# Patient Record
Sex: Male | Born: 1935 | Race: White | Hispanic: No | Marital: Married | Smoking: Never smoker
Health system: Southern US, Community
[De-identification: ages and names within clinical notes are randomized; demographics above are authoritative.]

## PROBLEM LIST (undated history)

## (undated) DIAGNOSIS — L409 Psoriasis, unspecified: Secondary | ICD-10-CM

## (undated) DIAGNOSIS — C801 Malignant (primary) neoplasm, unspecified: Secondary | ICD-10-CM

## (undated) DIAGNOSIS — I10 Essential (primary) hypertension: Secondary | ICD-10-CM

## (undated) DIAGNOSIS — M109 Gout, unspecified: Secondary | ICD-10-CM

## (undated) DIAGNOSIS — G309 Alzheimer's disease, unspecified: Secondary | ICD-10-CM

## (undated) DIAGNOSIS — E7800 Pure hypercholesterolemia, unspecified: Secondary | ICD-10-CM

## (undated) DIAGNOSIS — F0280 Dementia in other diseases classified elsewhere without behavioral disturbance: Secondary | ICD-10-CM

## (undated) DIAGNOSIS — R609 Edema, unspecified: Secondary | ICD-10-CM

## (undated) DIAGNOSIS — K219 Gastro-esophageal reflux disease without esophagitis: Secondary | ICD-10-CM

## (undated) DIAGNOSIS — M1990 Unspecified osteoarthritis, unspecified site: Secondary | ICD-10-CM

## (undated) HISTORY — PX: ESOPHAGEAL DILATION: SHX303

## (undated) HISTORY — DX: Psoriasis, unspecified: L40.9

## (undated) HISTORY — DX: Edema, unspecified: R60.9

## (undated) HISTORY — PX: TONSILLECTOMY: SUR1361

---

## 2011-04-29 ENCOUNTER — Ambulatory Visit (INDEPENDENT_AMBULATORY_CARE_PROVIDER_SITE_OTHER): Payer: Medicare Other | Admitting: Urology

## 2011-04-29 ENCOUNTER — Other Ambulatory Visit: Payer: Self-pay | Admitting: Urology

## 2011-04-29 DIAGNOSIS — R972 Elevated prostate specific antigen [PSA]: Secondary | ICD-10-CM

## 2011-04-29 DIAGNOSIS — N400 Enlarged prostate without lower urinary tract symptoms: Secondary | ICD-10-CM

## 2011-05-13 ENCOUNTER — Other Ambulatory Visit: Payer: Self-pay | Admitting: Urology

## 2011-05-13 ENCOUNTER — Ambulatory Visit (HOSPITAL_COMMUNITY)
Admission: RE | Admit: 2011-05-13 | Discharge: 2011-05-13 | Disposition: A | Discharge status: Home / Self Care | Payer: Medicare Other | Source: Ambulatory Visit | Attending: Urology | Admitting: Urology

## 2011-05-13 DIAGNOSIS — R972 Elevated prostate specific antigen [PSA]: Secondary | ICD-10-CM

## 2011-09-16 ENCOUNTER — Ambulatory Visit (INDEPENDENT_AMBULATORY_CARE_PROVIDER_SITE_OTHER): Payer: Medicare Other | Admitting: Urology

## 2011-09-16 DIAGNOSIS — N400 Enlarged prostate without lower urinary tract symptoms: Secondary | ICD-10-CM

## 2011-09-16 DIAGNOSIS — C61 Malignant neoplasm of prostate: Secondary | ICD-10-CM

## 2011-10-27 ENCOUNTER — Other Ambulatory Visit (HOSPITAL_COMMUNITY): Payer: Self-pay | Admitting: Internal Medicine

## 2011-10-27 DIAGNOSIS — R413 Other amnesia: Secondary | ICD-10-CM

## 2011-10-29 ENCOUNTER — Ambulatory Visit (HOSPITAL_COMMUNITY): Payer: Medicare Other

## 2011-11-04 ENCOUNTER — Ambulatory Visit (HOSPITAL_COMMUNITY)
Admission: RE | Admit: 2011-11-04 | Discharge: 2011-11-04 | Disposition: A | Discharge status: Home / Self Care | Payer: Medicare Other | Source: Ambulatory Visit | Attending: Internal Medicine | Admitting: Internal Medicine

## 2011-11-04 DIAGNOSIS — G319 Degenerative disease of nervous system, unspecified: Secondary | ICD-10-CM | POA: Insufficient documentation

## 2011-11-04 DIAGNOSIS — R413 Other amnesia: Secondary | ICD-10-CM | POA: Insufficient documentation

## 2012-01-20 ENCOUNTER — Ambulatory Visit (INDEPENDENT_AMBULATORY_CARE_PROVIDER_SITE_OTHER): Payer: Medicare Other | Admitting: Urology

## 2012-01-20 DIAGNOSIS — N400 Enlarged prostate without lower urinary tract symptoms: Secondary | ICD-10-CM

## 2012-01-20 DIAGNOSIS — C61 Malignant neoplasm of prostate: Secondary | ICD-10-CM

## 2012-05-18 ENCOUNTER — Ambulatory Visit (INDEPENDENT_AMBULATORY_CARE_PROVIDER_SITE_OTHER): Payer: Medicare Other | Admitting: Urology

## 2012-05-18 DIAGNOSIS — N400 Enlarged prostate without lower urinary tract symptoms: Secondary | ICD-10-CM

## 2012-05-18 DIAGNOSIS — C61 Malignant neoplasm of prostate: Secondary | ICD-10-CM

## 2012-06-12 ENCOUNTER — Encounter (HOSPITAL_COMMUNITY): Payer: Self-pay

## 2012-06-12 ENCOUNTER — Other Ambulatory Visit: Payer: Self-pay

## 2012-06-12 ENCOUNTER — Emergency Department (HOSPITAL_COMMUNITY)
Admission: EM | Admit: 2012-06-12 | Discharge: 2012-06-12 | Disposition: A | Discharge status: Home / Self Care | Payer: Medicare Other | Attending: Internal Medicine | Admitting: Internal Medicine

## 2012-06-12 DIAGNOSIS — R000 Tachycardia, unspecified: Secondary | ICD-10-CM | POA: Insufficient documentation

## 2012-06-12 HISTORY — DX: Essential (primary) hypertension: I10

## 2012-06-12 HISTORY — DX: Gout, unspecified: M10.9

## 2012-06-12 HISTORY — DX: Pure hypercholesterolemia, unspecified: E78.00

## 2012-06-12 HISTORY — DX: Gastro-esophageal reflux disease without esophagitis: K21.9

## 2012-06-12 HISTORY — DX: Unspecified osteoarthritis, unspecified site: M19.90

## 2012-06-12 NOTE — ED Notes (Signed)
Apporx. 30 min pta, began having heart racing and felt like it was skipping a beat.

## 2012-06-13 NOTE — Progress Notes (Signed)
William Ramos, William Ramos               ACCOUNT NO.:  [id number]  MEDICAL RECORD NO.:  [id number]  LOCATION:  APA02                         FACILITY:  [facility]  PHYSICIAN:  Kingsley Callander. 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  He had not experienced chest pain, difficulty breathing, or syncope.  He does not use excessive amounts of caffeine. He is not using decongestants.  He does not use recreational drugs.  He has not abused alcohol.  He has no prior history of heart disease, but has had a history of hypertension.  He also has had a past history of prostate cancer and is undergoing observation in this regard.  He has been evaluated and treated also for memory loss.  PHYSICAL EXAMINATION:  GENERAL:  Reveals that he is alert, oriented, and in no distress. HEENT:  Unremarkable. LUNGS:  Clear. HEART:  Regular with no murmurs.  No ectopy is noted. ABDOMEN:  Soft and nontender with no hepatosplenomegaly. EXTREMITIES:  Revealed no cyanosis, clubbing, or edema. NEUROLOGIC:  Status is at baseline.  His electrocardiogram reveals normal sinus rhythm.  IMPRESSION:  Palpitations.  He likely felt premature atrial contractions or premature ventricular contractions earlier today.  His symptoms have resolved.  On monitoring in the emergency room, he is showing no ectopy. He has been reassured.  He will be observed and be seen in followup in the office as scheduled.     Kingsley Callander. Ouida Sills, MD     ROF/MEDQ  D:  06/13/2012  T:  06/13/2012  Job:  [id number]

## 2012-06-29 ENCOUNTER — Encounter (HOSPITAL_COMMUNITY): Payer: Self-pay

## 2012-06-29 ENCOUNTER — Encounter (HOSPITAL_COMMUNITY)
Admission: RE | Admit: 2012-06-29 | Discharge: 2012-06-29 | Disposition: A | Discharge status: Home / Self Care | Payer: Medicare Other | Source: Ambulatory Visit | Attending: Ophthalmology | Admitting: Ophthalmology

## 2012-06-29 HISTORY — DX: Malignant (primary) neoplasm, unspecified: C80.1

## 2012-06-29 LAB — BASIC METABOLIC PANEL
BUN: 20 mg/dL (ref 6–23)
CO2: 27 mEq/L (ref 19–32)
GFR calc non Af Amer: 67 mL/min — ABNORMAL LOW (ref >90)
Glucose, Bld: 96 mg/dL (ref 70–99)
Potassium: 3.9 mEq/L (ref 3.5–5.1)

## 2012-06-29 NOTE — Patient Instructions (Addendum)
20 William Ramos  06/29/2012   Your procedure is scheduled on:  07/01/12  Report to [facility] at 10:00 AM.  Call this number if you have problems the morning of surgery: [phone]   Remember:   Do not eat food or drink:After Midnight.  Take these medicines the morning of surgery with A SIP OF WATER: Amlodipine, Omeprazole, and Ramipril   Do not wear jewelry, make-up or nail polish.  Do not wear lotions, powders, or perfumes. You may wear deodorant.  Do not bring valuables to the hospital.  Contacts, dentures or bridgework may not be worn into surgery.  Leave suitcase in the car. After surgery it may be brought to your room.  For patients admitted to the hospital, checkout time is 11:00 AM the day of discharge.   Patients discharged the day of surgery will not be allowed to drive home.  Special Instructions:Use your eye drops prior to surgery as directed by your eye doctor.   Please read over the following fact sheets that you were given: Pain Booklet, Anesthesia Post-op Instructions and Care and Recovery After Surgery    Cataract A cataract is a clouding of the lens of the eye. When a lens becomes cloudy, vision is reduced based on the degree and nature of the clouding. Many cataracts reduce vision to some degree. Some cataracts make people more near-sighted as they develop. Other cataracts increase glare. Cataracts that are ignored and become worse can sometimes look William Ramos. The William Ramos color can be seen through the pupil. CAUSES   Aging. However, cataracts may occur at any age, even in newborns.   Certain drugs.   Trauma to the eye.   Certain diseases such as diabetes.   Specific eye diseases such as chronic inflammation inside the eye or a sudden attack of a rare form of glaucoma.   Inherited or acquired medical problems.  SYMPTOMS   Gradual, progressive drop in vision in the affected eye.   Severe, rapid visual loss. This most often happens when trauma is the cause.    DIAGNOSIS  To detect a cataract, an eye doctor examines the lens. Cataracts are best diagnosed with an exam of the eyes with the pupils enlarged (dilated) by drops.  TREATMENT  For an early cataract, vision may improve by using different eyeglasses or stronger lighting. If that does not help your vision, surgery is the only effective treatment. A cataract needs to be surgically removed when vision loss interferes with your everyday activities, such as driving, reading, or watching TV. A cataract may also have to be removed if it prevents examination or treatment of another eye problem. Surgery removes the cloudy lens and usually replaces it with a substitute lens (intraocular lens, IOL).  At a time when both you and your doctor agree, the cataract will be surgically removed. If you have cataracts in both eyes, only one is usually removed at a time. This allows the operated eye to heal and be out of danger from any possible problems after surgery (such as infection or poor wound healing). In rare cases, a cataract may be doing damage to your eye. In these cases, your caregiver may advise surgical removal right away. The vast majority of people who have cataract surgery have better vision afterward. HOME CARE INSTRUCTIONS  If you are not planning surgery, you may be asked to do the following:  Use different eyeglasses.   Use stronger or brighter lighting.   Ask your eye doctor about reducing your medicine  dose or changing medicines if it is thought that a medicine caused your cataract. Changing medicines does not make the cataract go away on its own.   Become familiar with your surroundings. Poor vision can lead to injury. Avoid bumping into things on the affected side. You are at a higher risk for tripping or falling.   Exercise extreme care when driving or operating machinery.   Wear sunglasses if you are sensitive to bright light or experiencing problems with glare.  SEEK IMMEDIATE MEDICAL CARE  IF:   You have a worsening or sudden vision loss.   You notice redness, swelling, or increasing pain in the eye.   You have a fever.  Document Released: 11/17/2005 Document Revised: 11/06/2011 Document Reviewed: 07/11/2011 [facility] Patient Information 2012 [place], [place].   PATIENT INSTRUCTIONS POST-ANESTHESIA  IMMEDIATELY FOLLOWING SURGERY:  Do not drive or operate machinery for the first twenty four hours after surgery.  Do not make any important decisions for twenty four hours after surgery or while taking narcotic pain medications or sedatives.  If you develop intractable nausea and vomiting or a severe headache please notify your doctor immediately.  FOLLOW-UP:  Please make an appointment with your surgeon as instructed. You do not need to follow up with anesthesia unless specifically instructed to do so.  WOUND CARE INSTRUCTIONS (if applicable):  Keep a dry clean dressing on the anesthesia/puncture wound site if there is drainage.  Once the wound has quit draining you may leave it open to air.  Generally you should leave the bandage intact for twenty four hours unless there is drainage.  If the epidural site drains for more than 36-48 hours please call the anesthesia department.  QUESTIONS?:  Please feel free to call your physician or the hospital operator if you have any questions, and they will be happy to assist you.

## 2012-06-30 MED ORDER — LIDOCAINE HCL 3.5 % OP GEL
OPHTHALMIC | Status: AC
  Filled 2012-06-30: qty 5

## 2012-06-30 MED ORDER — TETRACAINE HCL 0.5 % OP SOLN
OPHTHALMIC | Status: AC
  Filled 2012-06-30: qty 2

## 2012-06-30 MED ORDER — CYCLOPENTOLATE-PHENYLEPHRINE 0.2-1 % OP SOLN: 1.0000 [drp] | OPHTHALMIC | Status: DC

## 2012-06-30 MED ORDER — LIDOCAINE HCL (PF) 1 % IJ SOLN
INTRAMUSCULAR | Status: AC
  Filled 2012-06-30: qty 2

## 2012-06-30 MED ORDER — NEOMYCIN-POLYMYXIN-DEXAMETH 3.5-10000-0.1 OP OINT
TOPICAL_OINTMENT | OPHTHALMIC | Status: AC
  Filled 2012-06-30: qty 3.5

## 2012-06-30 MED ORDER — PHENYLEPHRINE HCL 2.5 % OP SOLN
OPHTHALMIC | Status: AC
  Filled 2012-06-30: qty 2

## 2012-07-01 ENCOUNTER — Ambulatory Visit (HOSPITAL_COMMUNITY): Payer: Medicare Other | Admitting: Anesthesiology

## 2012-07-01 ENCOUNTER — Encounter (HOSPITAL_COMMUNITY)
Admission: RE | Disposition: A | Discharge status: Home / Self Care | Payer: Self-pay | Source: Ambulatory Visit | Attending: Ophthalmology

## 2012-07-01 ENCOUNTER — Encounter (HOSPITAL_COMMUNITY): Payer: Self-pay | Admitting: Anesthesiology

## 2012-07-01 ENCOUNTER — Encounter (HOSPITAL_COMMUNITY): Payer: Self-pay | Admitting: *Deleted

## 2012-07-01 ENCOUNTER — Ambulatory Visit (HOSPITAL_COMMUNITY)
Admission: RE | Admit: 2012-07-01 | Discharge: 2012-07-01 | Disposition: A | Discharge status: Home / Self Care | Payer: Medicare Other | Source: Ambulatory Visit | Attending: Ophthalmology | Admitting: Ophthalmology

## 2012-07-01 DIAGNOSIS — I10 Essential (primary) hypertension: Secondary | ICD-10-CM | POA: Insufficient documentation

## 2012-07-01 DIAGNOSIS — Z01812 Encounter for preprocedural laboratory examination: Secondary | ICD-10-CM | POA: Insufficient documentation

## 2012-07-01 DIAGNOSIS — Z79899 Other long term (current) drug therapy: Secondary | ICD-10-CM | POA: Insufficient documentation

## 2012-07-01 DIAGNOSIS — H2589 Other age-related cataract: Secondary | ICD-10-CM | POA: Insufficient documentation

## 2012-07-01 HISTORY — PX: CATARACT EXTRACTION W/PHACO: SHX586

## 2012-07-01 SURGERY — PHACOEMULSIFICATION, CATARACT, WITH IOL INSERTION
Anesthesia: Monitor Anesthesia Care | Site: Eye | Laterality: Right | Wound class: Clean

## 2012-07-01 MED ORDER — LIDOCAINE 3.5 % OP GEL OPTIME - NO CHARGE
OPHTHALMIC | Status: DC | PRN
  Administered 2012-07-01: 2 [drp] via OPHTHALMIC

## 2012-07-01 MED ORDER — CYCLOPENTOLATE-PHENYLEPHRINE 0.2-1 % OP SOLN
1.0000 [drp] | OPHTHALMIC | Status: AC
  Administered 2012-07-01 (×3): 1 [drp] via OPHTHALMIC

## 2012-07-01 MED ORDER — TETRACAINE HCL 0.5 % OP SOLN
1.0000 [drp] | OPHTHALMIC | Status: AC
  Administered 2012-07-01 (×3): 1 [drp] via OPHTHALMIC

## 2012-07-01 MED ORDER — PHENYLEPHRINE HCL 2.5 % OP SOLN
1.0000 [drp] | OPHTHALMIC | Status: AC
  Administered 2012-07-01 (×3): 1 [drp] via OPHTHALMIC

## 2012-07-01 MED ORDER — EPINEPHRINE HCL 1 MG/ML IJ SOLN
INTRAOCULAR | Status: DC | PRN
  Administered 2012-07-01: 12:00:00

## 2012-07-01 MED ORDER — LIDOCAINE HCL (PF) 1 % IJ SOLN
INTRAMUSCULAR | Status: DC | PRN
  Administered 2012-07-01: .4 mL

## 2012-07-01 MED ORDER — PROVISC 10 MG/ML IO SOLN
INTRAOCULAR | Status: DC | PRN
  Administered 2012-07-01: 8.5 mg via INTRAOCULAR

## 2012-07-01 MED ORDER — LIDOCAINE HCL 3.5 % OP GEL: 1.0000 "application " | Freq: Once | OPHTHALMIC | Status: DC

## 2012-07-01 MED ORDER — BSS IO SOLN
INTRAOCULAR | Status: DC | PRN
  Administered 2012-07-01: 15 mL via INTRAOCULAR

## 2012-07-01 MED ORDER — POVIDONE-IODINE 5 % OP SOLN
OPHTHALMIC | Status: DC | PRN
  Administered 2012-07-01: 1 via OPHTHALMIC

## 2012-07-01 MED ORDER — LACTATED RINGERS IV SOLN
INTRAVENOUS | Status: DC | PRN
  Administered 2012-07-01: 12:00:00 via INTRAVENOUS

## 2012-07-01 MED ORDER — NEOMYCIN-POLYMYXIN-DEXAMETH 0.1 % OP OINT
TOPICAL_OINTMENT | OPHTHALMIC | Status: DC | PRN
  Administered 2012-07-01: 1 via OPHTHALMIC

## 2012-07-01 MED ORDER — EPINEPHRINE HCL 1 MG/ML IJ SOLN
INTRAMUSCULAR | Status: AC
  Filled 2012-07-01: qty 1

## 2012-07-01 MED ORDER — MIDAZOLAM HCL 2 MG/2ML IJ SOLN
INTRAMUSCULAR | Status: AC
  Filled 2012-07-01: qty 2

## 2012-07-01 SURGICAL SUPPLY — 32 items
CAPSULAR TENSION RING-AMO (OPHTHALMIC RELATED) IMPLANT
CLOTH BEACON ORANGE TIMEOUT ST (SAFETY) ×2 IMPLANT
EYE SHIELD UNIVERSAL CLEAR (GAUZE/BANDAGES/DRESSINGS) ×2 IMPLANT
GLOVE BIO SURGEON STRL SZ 6.5 (GLOVE) IMPLANT
GLOVE BIOGEL PI IND STRL 6.5 (GLOVE) IMPLANT
GLOVE BIOGEL PI IND STRL 7.0 (GLOVE) ×1 IMPLANT
GLOVE BIOGEL PI IND STRL 7.5 (GLOVE) IMPLANT
GLOVE BIOGEL PI INDICATOR 6.5 (GLOVE)
GLOVE BIOGEL PI INDICATOR 7.0 (GLOVE) ×1
GLOVE BIOGEL PI INDICATOR 7.5 (GLOVE)
GLOVE ECLIPSE 6.5 STRL STRAW (GLOVE) IMPLANT
GLOVE ECLIPSE 7.0 STRL STRAW (GLOVE) IMPLANT
GLOVE ECLIPSE 7.5 STRL STRAW (GLOVE) IMPLANT
GLOVE EXAM NITRILE LRG STRL (GLOVE) IMPLANT
GLOVE EXAM NITRILE MD LF STRL (GLOVE) ×2 IMPLANT
GLOVE SKINSENSE NS SZ6.5 (GLOVE)
GLOVE SKINSENSE NS SZ7.0 (GLOVE)
GLOVE SKINSENSE STRL SZ6.5 (GLOVE) IMPLANT
GLOVE SKINSENSE STRL SZ7.0 (GLOVE) IMPLANT
KIT VITRECTOMY (OPHTHALMIC RELATED) IMPLANT
PAD ARMBOARD 7.5X6 YLW CONV (MISCELLANEOUS) ×2 IMPLANT
PROC W NO LENS (INTRAOCULAR LENS)
PROC W SPEC LENS (INTRAOCULAR LENS)
PROCESS W NO LENS (INTRAOCULAR LENS) IMPLANT
PROCESS W SPEC LENS (INTRAOCULAR LENS) IMPLANT
RING MALYGIN (MISCELLANEOUS) IMPLANT
SIGHTPATH CAT PROC W REG LENS (Ophthalmic Related) ×2 IMPLANT
SYR TB 1ML LL NO SAFETY (SYRINGE) ×2 IMPLANT
TAPE SURG TRANSPORE 1 IN (GAUZE/BANDAGES/DRESSINGS) ×1 IMPLANT
TAPE SURGICAL TRANSPORE 1 IN (GAUZE/BANDAGES/DRESSINGS) ×1
VISCOELASTIC ADDITIONAL (OPHTHALMIC RELATED) IMPLANT
WATER STERILE IRR 250ML POUR (IV SOLUTION) ×2 IMPLANT

## 2012-07-01 NOTE — Transfer of Care (Signed)
Immediate Anesthesia Transfer of Care Note  Patient: William Ramos  Procedure(s) Performed: Procedure(s) (LRB): CATARACT EXTRACTION PHACO AND INTRAOCULAR LENS PLACEMENT (IOC) (Right)  Patient Location: PACU and Short Stay  Anesthesia Type: MAC  Level of Consciousness: awake, alert , oriented and patient cooperative  Airway & Oxygen Therapy: Patient Spontanous Breathing  Post-op Assessment: Report given to PACU RN, Post -op Vital signs reviewed and stable and Patient moving all extremities  Post vital signs: Reviewed and stable  Complications: No apparent anesthesia complications

## 2012-07-01 NOTE — Anesthesia Postprocedure Evaluation (Signed)
  Anesthesia Post-op Note  Patient: William Ramos  Procedure(s) Performed: Procedure(s) (LRB): CATARACT EXTRACTION PHACO AND INTRAOCULAR LENS PLACEMENT (IOC) (Right)  Patient Location: PACU and Short Stay  Anesthesia Type: MAC  Level of Consciousness: awake, alert , oriented and patient cooperative  Airway and Oxygen Therapy: Patient Spontanous Breathing  Post-op Pain: none  Post-op Assessment: Post-op Vital signs reviewed, Patient's Cardiovascular Status Stable, Respiratory Function Stable, Patent Airway, No signs of Nausea or vomiting and Pain level controlled  Post-op Vital Signs: Reviewed and stable  Complications: No apparent anesthesia complications

## 2012-07-01 NOTE — Anesthesia Preprocedure Evaluation (Signed)
Anesthesia Evaluation  Patient identified by MRN, date of birth, ID band Patient awake    Reviewed: Allergy & Precautions, H&P , NPO status , Patient's Chart, lab work & pertinent test results  Airway Mallampati: I TM Distance: >3 FB     Dental  (+) Teeth Intact and Implants   Pulmonary neg pulmonary ROS,  breath sounds clear to auscultation        Cardiovascular hypertension, Pt. on medications Rhythm:Regular Rate:Normal     Neuro/Psych    GI/Hepatic GERD-  Medicated and Controlled,  Endo/Other    Renal/GU      Musculoskeletal   Abdominal   Peds  Hematology   Anesthesia Other Findings   Reproductive/Obstetrics                           Anesthesia Physical Anesthesia Plan  ASA: II  Anesthesia Plan: MAC   Post-op Pain Management:    Induction: Intravenous  Airway Management Planned: Nasal Cannula  Additional Equipment:   Intra-op Plan:   Post-operative Plan:   Informed Consent: I have reviewed the patients History and Physical, chart, labs and discussed the procedure including the risks, benefits and alternatives for the proposed anesthesia with the patient or authorized representative who has indicated his/her understanding and acceptance.     Plan Discussed with:   Anesthesia Plan Comments:         Anesthesia Quick Evaluation  

## 2012-07-01 NOTE — Brief Op Note (Signed)
Pre-Op Dx: Cataract OD Post-Op Dx: Cataract OD Surgeon: Avalie Oconnor Anesthesia: Topical with MAC Surgery: Cataract Extraction with Intraocular lens Implant OD Implant: Lenstec, Model Softec HD Blood Loss: None Specimen: None Complications: None 

## 2012-07-01 NOTE — H&P (Signed)
I have reviewed the H&P, the patient was re-examined, and I have identified no interval changes in medical condition and plan of care since the history and physical of record  

## 2012-07-02 NOTE — Op Note (Signed)
William Ramos, William Ramos               ACCOUNT NO.:  [id number]  MEDICAL RECORD NO.:  [id number]  LOCATION:  APPO                          FACILITY:  [facility]  PHYSICIAN:  Susanne Greenhouse, MD       DATE OF BIRTH:  1928/05/06  DATE OF PROCEDURE: DATE OF DISCHARGE:  07/01/2012                              OPERATIVE REPORT   PREOPERATIVE DIAGNOSIS:  Combined cataract, right eye.  POSTOPERATIVE DIAGNOSIS:  Combined cataract, right eye.  DIAGNOSIS CODE:  366.19.  OPERATION PERFORMED:  Phacoemulsification with posterior chamber intraocular lens implantation, right eye.  SURGEON:  Bonne Dolores. Karlen Barbar, MD.  ANESTHESIA:  Topical with IV sedation.  OPERATIVE SUMMARY:  In the preoperative area, dilating drops were placed into the right eye.  The patient was then brought into the operating room where he was placed under general anesthesia.  The eye was then prepped and draped.  Beginning with a 75 blade, a paracentesis port was made at the surgeon's 2 o'clock position.  The anterior chamber was then filled with a 1% nonpreserved lidocaine solution with epinephrine.  This was followed by Viscoat to deepen the chamber.  A small fornix-based peritomy was performed superiorly.  Next, a single iris hook was placed through the limbus superiorly.  A 2.4-mm keratome blade was then used to make a clear corneal incision over the iris hook.  A bent cystotome needle and Utrata forceps were used to create a continuous tear capsulotomy.  Hydrodissection was performed using balanced salt solution on a fine cannula.  The lens nucleus was then removed using phacoemulsification in a quadrant cracking technique.  The cortical material was then removed with irrigation and aspiration.  The capsular bag and anterior chamber were refilled with Provisc.  The wound was widened to approximately 3 mm and a posterior chamber intraocular lens was placed into the capsular bag without difficulty using an [employer] lens injecting  system.  A single 10-0 nylon suture was then used to close the incision as well as stromal hydration.  The Provisc was removed from the anterior chamber and capsular bag with irrigation and aspiration.  At this point, the wounds were tested for leak, which were negative.  The anterior chamber remained deep and stable.  The patient tolerated the procedure well.  There were no operative complications, and he awoke from general anesthesia without problem.  No surgical specimens.  The prosthetic device used, Lenstec posterior chamber lens model Softec HD, power of 22.5, serial number is [id number].          ______________________________ Susanne Greenhouse, MD     KEH/MEDQ  D:  07/01/2012  T:  07/02/2012  Job:  [id number]

## 2012-07-05 ENCOUNTER — Encounter (HOSPITAL_COMMUNITY): Payer: Self-pay | Admitting: Ophthalmology

## 2012-07-26 ENCOUNTER — Encounter (HOSPITAL_COMMUNITY): Payer: Self-pay | Admitting: Pharmacy Technician

## 2012-07-28 ENCOUNTER — Inpatient Hospital Stay (HOSPITAL_COMMUNITY): Admission: RE | Admit: 2012-07-28 | Payer: Medicare Other | Source: Ambulatory Visit

## 2012-08-26 ENCOUNTER — Encounter (HOSPITAL_COMMUNITY): Payer: Self-pay

## 2012-08-26 ENCOUNTER — Encounter (HOSPITAL_COMMUNITY)
Admission: RE | Admit: 2012-08-26 | Discharge: 2012-08-26 | Payer: Medicare Other | Source: Ambulatory Visit | Attending: Ophthalmology | Admitting: Ophthalmology

## 2012-08-26 LAB — HEMOGLOBIN AND HEMATOCRIT, BLOOD
HCT: 38.3 % — ABNORMAL LOW (ref 39.0–52.0)
Hemoglobin: 12.9 g/dL — ABNORMAL LOW (ref 13.0–17.0)

## 2012-08-26 LAB — BASIC METABOLIC PANEL
CO2: 26 mEq/L (ref 19–32)
Chloride: 104 mEq/L (ref 96–112)
GFR calc Af Amer: 66 mL/min — ABNORMAL LOW (ref >90)
Potassium: 4.2 mEq/L (ref 3.5–5.1)
Sodium: 139 mEq/L (ref 135–145)

## 2012-08-26 MED ORDER — MIDAZOLAM HCL 2 MG/2ML IJ SOLN: 1.0000 mg | INTRAMUSCULAR | Status: DC | PRN

## 2012-08-26 MED ORDER — FENTANYL CITRATE 0.05 MG/ML IJ SOLN: 25.0000 ug | INTRAMUSCULAR | Status: DC | PRN

## 2012-08-26 MED ORDER — LACTATED RINGERS IV SOLN: INTRAVENOUS | Status: DC

## 2012-08-26 MED ORDER — ONDANSETRON HCL 4 MG/2ML IJ SOLN: 4.0000 mg | Freq: Once | INTRAMUSCULAR | Status: DC | PRN

## 2012-08-26 NOTE — Patient Instructions (Addendum)
Your procedure is scheduled on:  Thursday, 09/02/12  Report to [facility] at    0915  AM.  Call this number if you have problems the morning of surgery: [phone]   Remember:   Do not eat or drink   After Midnight.  Take these medicines the morning of surgery with A SIP OF WATER: norvasc, aricept, prilosec, altace   Do not wear jewelry, make-up or nail polish.  Do not wear lotions, powders, or perfumes. You may wear deodorant.  Do not bring valuables to the hospital.  Contacts, dentures or bridgework may not be worn into surgery.     Patients discharged the day of surgery will not be allowed to drive home.  Name and phone number of your driver: driver  Special Instructions: Use eye drops as directed.   Please read over the following fact sheets that you were given: Pain Booklet, Anesthesia Post-op Instructions and Care and Recovery After Surgery    Cataract Surgery  A cataract is a clouding of the lens of the eye. When a lens becomes cloudy, vision is reduced based on the degree and nature of the clouding. Surgery may be needed to improve vision. Surgery removes the cloudy lens and usually replaces it with a substitute lens (intraocular lens, IOL). LET YOUR EYE DOCTOR KNOW ABOUT:  Allergies to food or medicine.   Medicines taken including herbs, eyedrops, over-the-counter medicines, and creams.   Use of steroids (by mouth or creams).   Previous problems with anesthetics or numbing medicine.   History of bleeding problems or blood clots.   Previous surgery.   Other health problems, including diabetes and kidney problems.   Possibility of pregnancy, if this applies.  RISKS AND COMPLICATIONS  Infection.   Inflammation of the eyeball (endophthalmitis) that can spread to both eyes (sympathetic ophthalmia).   Poor wound healing.   If an IOL is inserted, it can later fall out of proper position. This is very uncommon.   Clouding of the part of your eye that holds an IOL in  place. This is called an "after-cataract." These are uncommon, but easily treated.  BEFORE THE PROCEDURE  Do not eat or drink anything except small amounts of water for 8 to 12 before your surgery, or as directed by your caregiver.   Unless you are told otherwise, continue any eyedrops you have been prescribed.   Talk to your primary caregiver about all other medicines that you take (both prescription and non-prescription). In some cases, you may need to stop or change medicines near the time of your surgery. This is most important if you are taking blood-thinning medicine.Do not stop medicines unless you are told to do so.   Arrange for someone to drive you to and from the procedure.   Do not put contact lenses in either eye on the day of your surgery.  PROCEDURE There is more than one method for safely removing a cataract. Your doctor can explain the differences and help determine which is best for you. Phacoemulsification surgery is the most common form of cataract surgery.  An injection is given behind the eye or eyedrops are given to make this a painless procedure.   A small cut (incision) is made on the edge of the clear, dome-shaped surface that covers the front of the eye (cornea).   A tiny probe is painlessly inserted into the eye. This device gives off ultrasound waves that soften and break up the cloudy center of the lens. This makes  it easier for the cloudy lens to be removed by suction.   An IOL may be implanted.   The normal lens of the eye is covered by a clear capsule. Part of that capsule is intentionally left in the eye to support the IOL.   Your surgeon may or may not use stitches to close the incision.  There are other forms of cataract surgery that require a larger incision and stiches to close the eye. This approach is taken in cases where the doctor feels that the cataract cannot be easily removed using phacoemulsification. AFTER THE PROCEDURE  When an IOL is  implanted, it does not need care. It becomes a permanent part of your eye and cannot be seen or felt.   Your doctor will schedule follow-up exams to check on your progress.   Review your other medicines with your doctor to see which can be resumed after surgery.   Use eyedrops or take medicine as prescribed by your doctor.  Document Released: 11/06/2011 Document Reviewed: 11/03/2011 [facility] Patient Information 2012 [place], [place].  PATIENT INSTRUCTIONS POST-ANESTHESIA  IMMEDIATELY FOLLOWING SURGERY:  Do not drive or operate machinery for the first twenty four hours after surgery.  Do not make any important decisions for twenty four hours after surgery or while taking narcotic pain medications or sedatives.  If you develop intractable nausea and vomiting or a severe headache please notify your doctor immediately.  FOLLOW-UP:  Please make an appointment with your surgeon as instructed. You do not need to follow up with anesthesia unless specifically instructed to do so.  WOUND CARE INSTRUCTIONS (if applicable):  Keep a dry clean dressing on the anesthesia/puncture wound site if there is drainage.  Once the wound has quit draining you may leave it open to air.  Generally you should leave the bandage intact for twenty four hours unless there is drainage.  If the epidural site drains for more than 36-48 hours please call the anesthesia department.  QUESTIONS?:  Please feel free to call your physician or the hospital operator if you have any questions, and they will be happy to assist you.

## 2012-08-31 ENCOUNTER — Other Ambulatory Visit: Payer: Self-pay | Admitting: Urology

## 2012-08-31 ENCOUNTER — Ambulatory Visit (INDEPENDENT_AMBULATORY_CARE_PROVIDER_SITE_OTHER): Payer: Medicare Other | Admitting: Urology

## 2012-08-31 DIAGNOSIS — C61 Malignant neoplasm of prostate: Secondary | ICD-10-CM

## 2012-09-01 MED ORDER — PHENYLEPHRINE HCL 2.5 % OP SOLN
OPHTHALMIC | Status: AC
  Filled 2012-09-01: qty 2

## 2012-09-01 MED ORDER — TETRACAINE HCL 0.5 % OP SOLN
OPHTHALMIC | Status: AC
  Filled 2012-09-01: qty 2

## 2012-09-01 MED ORDER — NEOMYCIN-POLYMYXIN-DEXAMETH 3.5-10000-0.1 OP OINT
TOPICAL_OINTMENT | OPHTHALMIC | Status: AC
  Filled 2012-09-01: qty 3.5

## 2012-09-01 MED ORDER — LIDOCAINE HCL (PF) 1 % IJ SOLN
INTRAMUSCULAR | Status: AC
  Filled 2012-09-01: qty 2

## 2012-09-01 MED ORDER — LIDOCAINE HCL 3.5 % OP GEL
OPHTHALMIC | Status: AC
  Filled 2012-09-01: qty 5

## 2012-09-01 MED ORDER — CYCLOPENTOLATE HCL 1 % OP SOLN
OPHTHALMIC | Status: AC
  Filled 2012-09-01: qty 2

## 2012-09-02 ENCOUNTER — Encounter (HOSPITAL_COMMUNITY): Payer: Self-pay | Admitting: Anesthesiology

## 2012-09-02 ENCOUNTER — Ambulatory Visit (HOSPITAL_COMMUNITY): Payer: Medicare Other | Admitting: Anesthesiology

## 2012-09-02 ENCOUNTER — Encounter (HOSPITAL_COMMUNITY)
Admission: RE | Disposition: A | Discharge status: Home / Self Care | Payer: Self-pay | Source: Ambulatory Visit | Attending: Ophthalmology

## 2012-09-02 ENCOUNTER — Encounter (HOSPITAL_COMMUNITY): Payer: Self-pay | Admitting: *Deleted

## 2012-09-02 ENCOUNTER — Ambulatory Visit (HOSPITAL_COMMUNITY)
Admission: RE | Admit: 2012-09-02 | Discharge: 2012-09-02 | Disposition: A | Discharge status: Home / Self Care | Payer: Medicare Other | Source: Ambulatory Visit | Attending: Ophthalmology | Admitting: Ophthalmology

## 2012-09-02 DIAGNOSIS — I10 Essential (primary) hypertension: Secondary | ICD-10-CM | POA: Insufficient documentation

## 2012-09-02 DIAGNOSIS — Z01812 Encounter for preprocedural laboratory examination: Secondary | ICD-10-CM | POA: Insufficient documentation

## 2012-09-02 DIAGNOSIS — H2510 Age-related nuclear cataract, unspecified eye: Secondary | ICD-10-CM | POA: Insufficient documentation

## 2012-09-02 HISTORY — PX: CATARACT EXTRACTION W/PHACO: SHX586

## 2012-09-02 SURGERY — PHACOEMULSIFICATION, CATARACT, WITH IOL INSERTION
Anesthesia: Monitor Anesthesia Care | Site: Eye | Laterality: Left | Wound class: Clean

## 2012-09-02 MED ORDER — LACTATED RINGERS IV SOLN
INTRAVENOUS | Status: DC | PRN
  Administered 2012-09-02: 11:00:00 via INTRAVENOUS

## 2012-09-02 MED ORDER — BSS IO SOLN
INTRAOCULAR | Status: DC | PRN
  Administered 2012-09-02: 15 mL via INTRAOCULAR

## 2012-09-02 MED ORDER — TETRACAINE HCL 0.5 % OP SOLN
1.0000 [drp] | OPHTHALMIC | Status: AC
  Administered 2012-09-02 (×3): 1 [drp] via OPHTHALMIC

## 2012-09-02 MED ORDER — EPINEPHRINE HCL 1 MG/ML IJ SOLN
INTRAOCULAR | Status: DC | PRN
  Administered 2012-09-02: 12:00:00

## 2012-09-02 MED ORDER — MIDAZOLAM HCL 2 MG/2ML IJ SOLN
INTRAMUSCULAR | Status: AC
  Filled 2012-09-02: qty 2

## 2012-09-02 MED ORDER — PROVISC 10 MG/ML IO SOLN
INTRAOCULAR | Status: DC | PRN
  Administered 2012-09-02: 8.5 mg via INTRAOCULAR

## 2012-09-02 MED ORDER — CYCLOPENTOLATE-PHENYLEPHRINE 0.2-1 % OP SOLN: 1.0000 [drp] | OPHTHALMIC | Status: AC

## 2012-09-02 MED ORDER — POVIDONE-IODINE 5 % OP SOLN
OPHTHALMIC | Status: DC | PRN
  Administered 2012-09-02: 1 via OPHTHALMIC

## 2012-09-02 MED ORDER — PHENYLEPHRINE HCL 2.5 % OP SOLN
1.0000 [drp] | OPHTHALMIC | Status: AC
  Administered 2012-09-02 (×3): 1 [drp] via OPHTHALMIC

## 2012-09-02 MED ORDER — LIDOCAINE HCL 3.5 % OP GEL: 1.0000 "application " | Freq: Once | OPHTHALMIC | Status: DC

## 2012-09-02 MED ORDER — LACTATED RINGERS IV SOLN
INTRAVENOUS | Status: DC
  Administered 2012-09-02: 1000 mL via INTRAVENOUS

## 2012-09-02 MED ORDER — MIDAZOLAM HCL 2 MG/2ML IJ SOLN
1.0000 mg | INTRAMUSCULAR | Status: DC | PRN
  Administered 2012-09-02: 2 mg via INTRAVENOUS

## 2012-09-02 MED ORDER — EPINEPHRINE HCL 1 MG/ML IJ SOLN
INTRAMUSCULAR | Status: AC
  Filled 2012-09-02: qty 1

## 2012-09-02 MED ORDER — LIDOCAINE HCL (PF) 1 % IJ SOLN
INTRAMUSCULAR | Status: DC | PRN
  Administered 2012-09-02: .4 mL

## 2012-09-02 MED ORDER — LIDOCAINE 3.5 % OP GEL OPTIME - NO CHARGE
OPHTHALMIC | Status: DC | PRN
  Administered 2012-09-02: 2 [drp] via OPHTHALMIC

## 2012-09-02 MED ORDER — CYCLOPENTOLATE HCL 1 % OP SOLN
1.0000 [drp] | OPHTHALMIC | Status: AC
  Administered 2012-09-02 (×3): 1 [drp] via OPHTHALMIC

## 2012-09-02 MED ORDER — NEOMYCIN-POLYMYXIN-DEXAMETH 0.1 % OP OINT
TOPICAL_OINTMENT | OPHTHALMIC | Status: DC | PRN
  Administered 2012-09-02: 1 via OPHTHALMIC

## 2012-09-02 SURGICAL SUPPLY — 32 items

## 2012-09-02 NOTE — Anesthesia Postprocedure Evaluation (Signed)
  Anesthesia Post-op Note  Patient: William Ramos  Procedure(s) Performed: Procedure(s) (LRB) with comments: CATARACT EXTRACTION PHACO AND INTRAOCULAR LENS PLACEMENT (IOC) (Left) - CDE 16.23  Patient Location: Short stay  Anesthesia Type: MAC  Level of Consciousness: awake, alert , oriented and patient cooperative  Airway and Oxygen Therapy: Patient Spontanous Breathing  Post-op Pain: none  Post-op Assessment: Post-op Vital signs reviewed, Patient's Cardiovascular Status Stable, Respiratory Function Stable, Patent Airway, No signs of Nausea or vomiting and Pain level controlled  Post-op Vital Signs: Reviewed and stable  Complications: No apparent anesthesia complications

## 2012-09-02 NOTE — Brief Op Note (Signed)
Pre-Op Dx: Cataract OS Post-Op Dx: Cataract OS Surgeon: Ruperto Kiernan Anesthesia: Topical with MAC Surgery: Cataract Extraction with Intraocular lens Implant OS Implant: Lenstec, Model Softec HD Blood Loss: None Specimen: None Complications: None 

## 2012-09-02 NOTE — Anesthesia Preprocedure Evaluation (Signed)
Anesthesia Evaluation  Patient identified by MRN, date of birth, ID band Patient awake    Reviewed: Allergy & Precautions, H&P , NPO status , Patient's Chart, lab work & pertinent test results  Airway Mallampati: I TM Distance: >3 FB     Dental  (+) Teeth Intact and Implants   Pulmonary neg pulmonary ROS,  breath sounds clear to auscultation        Cardiovascular hypertension, Pt. on medications Rhythm:Regular Rate:Normal     Neuro/Psych    GI/Hepatic GERD-  Medicated and Controlled,  Endo/Other    Renal/GU      Musculoskeletal   Abdominal   Peds  Hematology   Anesthesia Other Findings   Reproductive/Obstetrics                           Anesthesia Physical Anesthesia Plan  ASA: II  Anesthesia Plan: MAC   Post-op Pain Management:    Induction: Intravenous  Airway Management Planned: Nasal Cannula  Additional Equipment:   Intra-op Plan:   Post-operative Plan:   Informed Consent: I have reviewed the patients History and Physical, chart, labs and discussed the procedure including the risks, benefits and alternatives for the proposed anesthesia with the patient or authorized representative who has indicated his/her understanding and acceptance.     Plan Discussed with:   Anesthesia Plan Comments:         Anesthesia Quick Evaluation

## 2012-09-02 NOTE — Transfer of Care (Signed)
  Anesthesia Post-op Note  Patient: William Ramos  Procedure(s) Performed: Procedure(s) (LRB) with comments: CATARACT EXTRACTION PHACO AND INTRAOCULAR LENS PLACEMENT (IOC) (Left) - CDE 16.23  Patient Location: Short stay  Anesthesia Type: MAC  Level of Consciousness: awake, alert , oriented and patient cooperative  Airway and Oxygen Therapy: Patient Spontanous Breathing  Post-op Pain: none  Post-op Assessment: Post-op Vital signs reviewed, Patient's Cardiovascular Status Stable, Respiratory Function Stable, Patent Airway, No signs of Nausea or vomiting and Pain level controlled  Post-op Vital Signs: Reviewed and stable  Complications: No apparent anesthesia complications  

## 2012-09-02 NOTE — H&P (Signed)
I have reviewed the H&P, the patient was re-examined, and I have identified no interval changes in medical condition and plan of care since the history and physical of record  

## 2012-09-02 NOTE — Preoperative (Signed)
Beta Blockers   Reason not to administer Beta Blockers:Not Applicable 

## 2012-09-02 NOTE — Anesthesia Procedure Notes (Signed)
Procedure Name: MAC Date/Time: 09/02/2012 11:52 AM Performed by: Carolyne Littles, AMY L Pre-anesthesia Checklist: Patient identified, Patient being monitored, Emergency Drugs available, Timeout performed and Suction available Patient Re-evaluated:Patient Re-evaluated prior to inductionOxygen Delivery Method: Nasal cannula

## 2012-09-03 NOTE — Op Note (Signed)
NAMEROYCE, SCHEIDER               ACCOUNT NO.:  [id number]  MEDICAL RECORD NO.:  [id number]  LOCATION:  [facility]                          FACILITY:  [facility]  PHYSICIAN:  Susanne Greenhouse, MD       DATE OF BIRTH:  1928/01/15  DATE OF PROCEDURE:  09/02/2012 DATE OF DISCHARGE:  09/02/2012                              OPERATIVE REPORT   PREOPERATIVE DIAGNOSIS:  Nuclear cataract, left eye, diagnosis code 366.16.  POSTOPERATIVE DIAGNOSIS:  Nuclear cataract, left eye, diagnosis code 366.16.  SURGEON:  Susanne Greenhouse, MD  OPERATION PERFORMED:  Phacoemulsification with posterior chamber intraocular lens implantation, left eye.  ANESTHESIA:  Topical with IV sedation.  OPERATIVE SUMMARY:  In the preoperative area, dilating drops were placed into the left eye.  The patient was then brought into the operating room where he was placed under topical anesthesia and IV sedation.  The eye was then prepped and draped.  Beginning with a 75 blade, a paracentesis port was made at the surgeon's 2 o'clock position.  The anterior chamber was then filled with a 1% nonpreserved lidocaine solution with epinephrine.  This was followed by Viscoat to deepen the chamber.  A small fornix-based peritomy was performed superiorly.  Next, a single iris hook was placed through the limbus superiorly.  A 2.4-mm keratome blade was then used to make a clear corneal incision over the iris hook. A bent cystotome needle and Utrata forceps were used to create a continuous tear capsulotomy.  Hydrodissection was performed using balanced salt solution on a fine cannula.  The lens nucleus was then removed using phacoemulsification in a quadrant cracking technique.  The cortical material was then removed with irrigation and aspiration.  The capsular bag and anterior chamber were refilled with Provisc.  The wound was widened to approximately 3 mm and a posterior chamber intraocular lens was placed into the capsular bag without difficulty  using an [employer] lens injecting system.  A single 10-0 nylon suture was then used to close the incision as well as stromal hydration.  The Provisc was removed from the anterior chamber and capsular bag with irrigation and aspiration.  At this point, the wounds were tested for leak, which were negative.  The anterior chamber remained deep and stable.  The patient tolerated the procedure well.  There were no operative complications, and he awoke from topical anesthesia and IV sedation without problem.  No surgical specimens.  Prosthetic device used is a Lenstec posterior chamber lens, model Softec HD, power of 21.75, serial number is [id number].          ______________________________ Susanne Greenhouse, MD     KEH/MEDQ  D:  09/02/2012  T:  09/03/2012  Job:  [id number]

## 2012-09-28 ENCOUNTER — Other Ambulatory Visit: Payer: Self-pay | Admitting: Urology

## 2012-09-28 DIAGNOSIS — C61 Malignant neoplasm of prostate: Secondary | ICD-10-CM

## 2012-10-12 ENCOUNTER — Other Ambulatory Visit (HOSPITAL_COMMUNITY): Payer: Medicare Other

## 2012-11-02 ENCOUNTER — Ambulatory Visit: Payer: Medicare Other | Admitting: Urology

## 2012-12-07 ENCOUNTER — Other Ambulatory Visit (HOSPITAL_COMMUNITY): Payer: Self-pay | Admitting: Urology

## 2012-12-07 ENCOUNTER — Ambulatory Visit (HOSPITAL_COMMUNITY)
Admission: RE | Admit: 2012-12-07 | Discharge: 2012-12-07 | Disposition: A | Discharge status: Home / Self Care | Payer: Medicare Other | Source: Ambulatory Visit | Attending: Urology | Admitting: Urology

## 2012-12-07 ENCOUNTER — Other Ambulatory Visit: Payer: Self-pay | Admitting: Urology

## 2012-12-07 DIAGNOSIS — C61 Malignant neoplasm of prostate: Secondary | ICD-10-CM

## 2012-12-07 NOTE — Progress Notes (Signed)
Transrectal prostate biopsies obtained x12

## 2012-12-13 ENCOUNTER — Encounter: Payer: Self-pay | Admitting: Urology

## 2013-01-17 ENCOUNTER — Other Ambulatory Visit: Payer: Self-pay | Admitting: Urology

## 2013-01-17 DIAGNOSIS — C61 Malignant neoplasm of prostate: Secondary | ICD-10-CM

## 2013-01-18 ENCOUNTER — Ambulatory Visit (HOSPITAL_COMMUNITY): Payer: Medicare Other

## 2014-06-16 ENCOUNTER — Encounter: Payer: Self-pay | Admitting: *Deleted

## 2014-06-19 ENCOUNTER — Encounter (INDEPENDENT_AMBULATORY_CARE_PROVIDER_SITE_OTHER): Payer: Self-pay

## 2014-06-19 ENCOUNTER — Ambulatory Visit (INDEPENDENT_AMBULATORY_CARE_PROVIDER_SITE_OTHER): Payer: Medicare Other | Admitting: Neurology

## 2014-06-19 ENCOUNTER — Encounter: Payer: Self-pay | Admitting: Neurology

## 2014-06-19 VITALS — BP 125/70 | HR 84 | Resp 16 | Ht 67.0 in | Wt 162.0 lb

## 2014-06-19 DIAGNOSIS — F03918 Unspecified dementia, unspecified severity, with other behavioral disturbance: Secondary | ICD-10-CM | POA: Insufficient documentation

## 2014-06-19 DIAGNOSIS — F0391 Unspecified dementia with behavioral disturbance: Secondary | ICD-10-CM

## 2014-06-19 MED ORDER — RISPERIDONE 0.25 MG PO TABS: 0.2500 mg | ORAL_TABLET | Freq: Every day | ORAL | Status: DC

## 2014-06-19 MED ORDER — DONEPEZIL HCL 10 MG PO TABS: ORAL_TABLET | ORAL | Status: DC

## 2014-06-19 NOTE — Patient Instructions (Signed)
Dementia With Lewy Bodies Dementia with Lewy bodies is a common type of dementia that gets progressively worse with time. Typical characteristics include progressive worsening of mental function combined with 3 other features: seeing things that are not there (hallucinations), significant fluctuations in alertness and attention, and changes in movement similar to Parkinson's disease (rigid muscles, slowed movement, and tremors). Dementia with Lewy bodies has many similar symptoms that Parkinson's disease and Alzheimer's disease has. CAUSES The symptoms of dementia with Lewy bodies are caused by the buildup of Lewy bodies, which are proteins that build up in brain cells and affect mental function and movement. No one knows exactly what causes the buildup of Lewy bodies, but it may be related to Alzheimer's dementia or Parkinson's disease.  SYMPTOMS   Memory problems.  Confusion.  Reduced attention span.  Hallucinations.  False ideas about another person or situation (delusions).  Trouble moving (rigid muscles, slowed movement, and tremors).  Shuffling movements (gait).  Sleep problems (acting out dreams).  Fluctuating attention (periods of drowsiness or lethargy, long periods of time spent staring into space, or disorganized speech). DIAGNOSIS There is no specific test to diagnose dementia with Lewy bodies, but your caregiver will evaluate you based on your symptoms and physical exam. Your evaluation may also include:  Memory testing.  Lab tests.  Brain imaging (CT scan, MRI).  Electroencephalogram (EEG).  Lumbar puncture. TREATMENT  There is no cure for dementia with Lewy bodies. Medicines may be prescribed to help with mental function and movement.  HOME CARE INSTRUCTIONS The care of individuals with dementia is varied and dependent upon the progression of the dementia. The following suggestions are intended for the person living with, or caring for, the person with  dementia.  Create a safe environment.  Remove the locks on bathroom doors to prevent the person from accidentally locking himself or herself in.  Use childproof latches on kitchen cabinets and any place where cleaning supplies, chemicals, or alcohol are kept.  Use childproof covers in unused electrical outlets.  Install childproof devices to keep doors and windows secured.  Remove stove knobs or install safety knobs and an automatic shut-off on the stove.  Lower the temperature on water heaters.  Label medicines and keep them locked up.  Secure knives, lighters, matches, power tools, and guns, and keep these items out of reach.  Keep the house free from clutter. Remove rugs or anything that might contribute to a fall.  Remove objects that might break and hurt the person.  Make sure lighting is good, both inside and outside.  Install grab rails as needed.  Use a monitoring device to alert you to falls or other needs for help.  Reduce confusion.  Keep familiar objects and people around.  Use night lights or dim lights at night.  Label items or areas.  Use reminders, notes, or directions for daily activities or tasks.  Keep a simple, consistent routine for waking, meals, bathing, dressing, and bedtime.  Create a calm, quiet environment.  Place large clocks and calendars prominently.  Display emergency numbers and the home address near all telephones.  Use cues to establish different times of the day. An example is to open curtains to let the natural light in during the day.  Use effective communication.  Choose simple words and short sentences.  Use a gentle, calm tone of voice.  Be careful not to interrupt.  If the person is struggling to find a word or communicate a thought, try to provide the   word or thought.  Ask one question at a time. Allow the person ample time to answer questions. Repeat the question again if the person does not respond.  Reduce  nighttime restlessness.  Provide a comfortable bed.  Have a consistent nighttime routine.  Ensure a regular walking or physical activity schedule. Involve the person in daily activities as much as possible.  Limit napping during the day.  Limit caffeine.  Attend social events that stimulate rather than overwhelm the senses.  Encourage good nutrition and hydration.  Reduce distractions during meal times and snacks.  Avoid foods that are too hot or too cold.  Monitor chewing and swallowing ability.  Continue with routine vision, hearing, dental, and medical screenings.  Only give over-the-counter or prescription medicines as directed by the caregiver.  Monitor driving abilities. Do not allow the person to drive when safe driving is no longer possible.  Register with an identification program which could provide location assistance in the event of a missing person situation. SEEK MEDICAL CARE IF:  New behavioral problems develop, such as moodiness or aggressiveness.  Any new problem with brain function develop, such as balance, speech, or falling a lot.  Problems with swallowing develop. Small changes or worsening in any aspect of brain function can be a sign that the illness is getting worse. It can also be a sign of another medical illness such as infection. Seeing a caregiver right away is important.  SEEK IMMEDIATE MEDICAL CARE IF:  A fever develops.  Confusion develops or worsens.  New or worsened sleepiness develops or staying awake becomes difficult. Document Released: 08/09/2002 Document Revised: 02/09/2012 Document Reviewed: 07/14/2011 [employer] Patient Information 2015 [employer]. This information is not intended to replace advice given to you by your health care provider. Make sure you discuss any questions you have with your health care provider.  

## 2014-06-19 NOTE — Progress Notes (Signed)
[facility]   Provider:  Larey Seat, [place] D  Referring Provider: Asencion Noble, MD Primary Care Physician:  Asencion Noble, MD  Chief Complaint  Patient presents with  . New Evaluation    Room 10  . Alzheimer's    MMSE- 11/30  AFT- 3    HPI:  William Ramos is a 80 y.o. caucasian, right handed, married male, who is seen here as a referral from Dr. Willey Blade for a memory disorder work up.    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 William Ramos has the following past medical history he has been diagnosed with prostate cancer has a mild form of bulge, gastroesophageal reflux, osteoarthritis, and unspecified hypertension. He underwent a dilation of the esophageal stricture-Barrett's esophagus. He underwent a tonsillectomy. The patient has never smoked may drink one glass of wine a day, he has no family history of dementia his mother died of a heart attack in his father of a stroke. Both parents were of old age.   William Ramos recalls that about 2 years ago the patient was finally treated with medication for what appeared to have been a gradual onset of memory loss. His wife recalls that there were some stepwise changes on top of the gradual decline but that these seem to have always recovered back to baseline. It does not seem to be a vascular dementia. There has been no history of strokes. The patient had at times problems with anger or impulsivity but this is not longer a problem at this time. He had auditory and visual hallucinations about 2 years ago.  The patient has been treated for his memory loss with Aricept 10 mg daily and Namenda X. are 28 mg daily. Is not longer driving, after driving off to the grocery store and got lost. He found instead a famliar barbeque place , ate breakfast and than continued his journey home form there. The couple   sold the car shortly after in 2014.  He not walking alone either- except for familiar places in residential surroundings, and never alone.  He quit playing golf, after he became to frustrated with the game.  The couple has housekeeping help. 2 of their 3  adult children live in the state and one in [place].        Review of Systems: Out of a complete 14 system review, the patient complains of only the following symptoms, and all other reviewed systems are negative. The patient denies any symptoms, his wife endorsed a good appetite,  Sleep routine from 9 PMand 6 AM and no excessive daytime sleepiness. He naps "out of boredom" .   History   Social History  . Marital Status: Married    Spouse Name: William Ramos    Number of Children: 3  . Years of Education: M.D.   Occupational History  .     Social History Main Topics  . Smoking status: Never Smoker   . Smokeless tobacco: Never Used  . Alcohol Use: Yes     Comment: 2 glasses of wine daily  . Drug Use: No  . Sexual Activity: Yes    Birth Control/ Protection: None   Other Topics Concern  . Not on file   Social History Narrative   Patient is married William Ramos) and lives at home with his wife.   Patient is retired.  Patient has three adult children.   Patient is a Sport and exercise psychologist.   Patient is right-handed.   Patient drinks two cups of coffee daily.             Family History  Problem Relation Age of Onset  . Heart attack Mother   . Stroke Father     Past Medical History  Diagnosis Date  . Hypertension   . Gout   . Hypercholesteremia   . Arthritis   . Acid reflux   . Cancer     Cancer-prostate  . Edema   . Psoriasis     Past Surgical History  Procedure Laterality Date  . Cataract extraction w/phaco  07/01/2012    Procedure: CATARACT EXTRACTION PHACO AND INTRAOCULAR LENS PLACEMENT (IOC);  Surgeon: Tonny Branch, MD;  Location: [facility];  Service: Ophthalmology;  Laterality: Right;  CDE 19.91  . Cataract extraction  w/phaco  09/02/2012    Procedure: CATARACT EXTRACTION PHACO AND INTRAOCULAR LENS PLACEMENT (IOC);  Surgeon: Tonny Branch, MD;  Location: [facility];  Service: Ophthalmology;  Laterality: Left;  CDE 16.23  . Tonsillectomy    . Dilatation of esophageal stricture      Current Outpatient Prescriptions  Medication Sig Dispense Refill  . amLODipine (NORVASC) 10 MG tablet Take 10 mg by mouth daily.      Marland Kitchen aspirin 81 MG tablet Take 81 mg by mouth daily.      Marland Kitchen atorvastatin (LIPITOR) 10 MG tablet Take 10 mg by mouth daily.      . beta carotene w/minerals (OCUVITE) tablet Take 1 tablet by mouth daily.      . diphenoxylate-atropine (LOMOTIL) 2.5-0.025 MG per tablet Take by mouth 4 (four) times daily as needed for diarrhea or loose stools.      . donepezil (ARICEPT) 10 MG tablet Take 10 mg by mouth at bedtime.       Marland Kitchen etodolac (LODINE) 400 MG tablet Take 400 mg by mouth daily.      . furosemide (LASIX) 20 MG tablet Take 20 mg by mouth daily.      Marland Kitchen LORazepam (ATIVAN) 0.5 MG tablet Take 0.5 mg by mouth every 6 (six) hours as needed for anxiety.      . Methylcellulose, Laxative, (CITRUCEL PO) Take by mouth. As needed      . omeprazole (PRILOSEC) 20 MG capsule Take 20 mg by mouth daily.      . probenecid (BENEMID) 500 MG tablet Take 500 mg by mouth daily.       . ramipril (ALTACE) 10 MG capsule Take 10 mg by mouth daily.      . vitamin B-12 (CYANOCOBALAMIN) 1000 MCG tablet Take 1,000 mcg by mouth daily.       No current facility-administered medications for this visit.   Facility-Administered Medications Ordered in Other Visits  Medication Dose Route Frequency Provider Last Rate Last Dose  . lactated ringers infusion   Intravenous Continuous Lerry Liner, MD      . midazolam (VERSED) injection 1-2 mg  1-2 mg Intravenous Q5 Min x 3 PRN Lerry Liner, MD        Allergies as of 06/19/2014  . (No Known Allergies)    Vitals: BP 125/70  Pulse 84  Resp 16  Ht 5\' 7"  (1.702 m)  Wt 162 lb (73.483 kg)  BMI  25.37 kg/m2 Last Weight:  Wt Readings from Last 1 Encounters:  06/19/14 162 lb (73.483 kg)   Last Height:   Ht Readings from Last 1  Encounters:  06/19/14 5\' 7"  (1.702 m)     Physical exam:  General: The patient is awake, alert and appears not in acute distress. The patient is well groomed. Head: Normocephalic, atraumatic. Neck is supple. Mallampati 2 , neck circumference: 15  Cardiovascular:  Regular rate and rhythm , without  murmurs or carotid bruit, and without distended neck veins. Respiratory: Lungs are clear to auscultation. Skin:  Without evidence of edema, or rash Trunk: BMI is  elevated and patient has normal posture.  Neurologic exam : The patient is awake and alert, oriented to place and time.  Memory subjective described as intact- he is quite unaware of his deficits. MMSE was 11 out of 30, not even the year or the season or the state. There is a normal attention span & concentration ability. Speech is non-fluent with  dysphonia and  aphasia. Mood and affect are appropriate.  Cranial nerves: Pupils are equal and briskly reactive to light. Extraocular movements  in vertical and horizontal planes intact and without nystagmus. Visual fields by finger perimetry are intact. Hearing to finger rub intact.  Facial sensation intact to fine touch. Facial motor strength is symmetric and tongue and uvula move midline.  Motor exam:  diffusely elevated  Tone, cog wheeling over the right more than the left biceps and both wrists. Normal muscle bulk and normal strength in all extremities.  Sensory:  Fine touch, pinprick and vibration were tested in all extremities.  Proprioception is tested in the upper extremities only. This was normal.  Coordination: Rapid alternating movements in the fingers/hands is tested and slowed . Finger-to-nose maneuver tested and normal without evidence of ataxia, dysmetria just mild tremor.  Gait and station: Patient walks without assistive device. He walks  "en bloc' with truncal rigidity, turns with 4-5 steps, and shuffles.  Able to ascend to the exam table.  Strength within normal limits. Stance is  Wide based gait and stance. Tandem gait and Romberg testing is deferred. Fell in the last 6 month once, stumbling over a small stair step. Deep tendon reflexes: in the  upper and lower extremities are symmetric and intact.  Babinski maneuver response is downgoing.   Assessment:  After physical and neurologic examination, review of laboratory studies, imaging, neurophysiology testing and pre-existing records, assessment is 1) by clinical picture and exam, rather severe Lewy body dementia than alzheimer's disease.  I agree with namenda and aricept use, will try to increase Aricept to 23 mg once a day. He has a lot of diarrhea currently -  2) he has no bradycardia. Try Aricept while getting daily hear rate. 3) he is currently not producing visual hallucinations , if this starts again, will use Seroquel as needed.   Plan:  Treatment plan and additional workup :

## 2014-09-19 ENCOUNTER — Encounter: Payer: Self-pay | Admitting: Neurology

## 2014-09-19 ENCOUNTER — Encounter (INDEPENDENT_AMBULATORY_CARE_PROVIDER_SITE_OTHER): Payer: Self-pay

## 2014-09-19 ENCOUNTER — Ambulatory Visit (INDEPENDENT_AMBULATORY_CARE_PROVIDER_SITE_OTHER): Payer: Medicare Other | Admitting: Neurology

## 2014-09-19 VITALS — BP 131/62 | HR 62 | Temp 97.1°F | Resp 14 | Ht 67.0 in | Wt 170.0 lb

## 2014-09-19 DIAGNOSIS — F0391 Unspecified dementia with behavioral disturbance: Secondary | ICD-10-CM

## 2014-09-19 DIAGNOSIS — F03918 Unspecified dementia, unspecified severity, with other behavioral disturbance: Secondary | ICD-10-CM

## 2014-09-19 MED ORDER — MEMANTINE HCL-DONEPEZIL HCL ER 14-10 MG PO CP24: 14.0000 mg | ORAL_CAPSULE | ORAL | Status: DC

## 2014-09-19 MED ORDER — LORAZEPAM 0.5 MG PO TABS: 0.5000 mg | ORAL_TABLET | Freq: Three times a day (TID) | ORAL | Status: DC | PRN

## 2014-09-19 NOTE — Patient Instructions (Signed)
Rv in 6 month with NP alternating with me every 6 month.   Dementia With Lewy Bodies Dementia with Lewy bodies is a common type of dementia that gets progressively worse with time. Typical characteristics include progressive worsening of mental function combined with 3 other features: seeing things that are not there (hallucinations), significant fluctuations in alertness and attention, and changes in movement similar to Parkinson's disease (rigid muscles, slowed movement, and tremors). Dementia with Lewy bodies has many similar symptoms that Parkinson's disease and Alzheimer's disease has. CAUSES The symptoms of dementia with Lewy bodies are caused by the buildup of Lewy bodies, which are proteins that build up in brain cells and affect mental function and movement. No one knows exactly what causes the buildup of Lewy bodies, but it may be related to Alzheimer's dementia or Parkinson's disease.  SYMPTOMS   Memory problems.  Confusion.  Reduced attention span.  Hallucinations.  False ideas about another person or situation (delusions).  Trouble moving (rigid muscles, slowed movement, and tremors).  Shuffling movements (gait).  Sleep problems (acting out dreams).  Fluctuating attention (periods of drowsiness or lethargy, long periods of time spent staring into space, or disorganized speech). DIAGNOSIS There is no specific test to diagnose dementia with Lewy bodies, but your caregiver will evaluate you based on your symptoms and physical exam. Your evaluation may also include:  Memory testing.  Lab tests.  Brain imaging (CT scan, MRI).  Electroencephalogram (EEG).  Lumbar puncture. TREATMENT  There is no cure for dementia with Lewy bodies. Medicines may be prescribed to help with mental function and movement.  HOME CARE INSTRUCTIONS The care of individuals with dementia is varied and dependent upon the progression of the dementia. The following suggestions are intended for the  person living with, or caring for, the person with dementia.  Create a safe environment.  Remove the locks on bathroom doors to prevent the person from accidentally locking himself or herself in.  Use childproof latches on kitchen cabinets and any place where cleaning supplies, chemicals, or alcohol are kept.  Use childproof covers in unused electrical outlets.  Install childproof devices to keep doors and windows secured.  Remove stove knobs or install safety knobs and an automatic shut-off on the stove.  Lower the temperature on water heaters.  Label medicines and keep them locked up.  Secure knives, lighters, matches, power tools, and guns, and keep these items out of reach.  Keep the house free from clutter. Remove rugs or anything that might contribute to a fall.  Remove objects that might break and hurt the person.  Make sure lighting is good, both inside and outside.  Install grab rails as needed.  Use a monitoring device to alert you to falls or other needs for help.  Reduce confusion.  Keep familiar objects and people around.  Use night lights or dim lights at night.  Label items or areas.  Use reminders, notes, or directions for daily activities or tasks.  Keep a simple, consistent routine for waking, meals, bathing, dressing, and bedtime.  Create a calm, quiet environment.  Place large clocks and calendars prominently.  Display emergency numbers and the home address near all telephones.  Use cues to establish different times of the day. An example is to open curtains to let the natural light in during the day.  Use effective communication.  Choose simple words and short sentences.  Use a gentle, calm tone of voice.  Be careful not to interrupt.  If the person  is struggling to find a word or communicate a thought, try to provide the word or thought.  Ask one question at a time. Allow the person ample time to answer questions. Repeat the question  again if the person does not respond.  Reduce nighttime restlessness.  Provide a comfortable bed.  Have a consistent nighttime routine.  Ensure a regular walking or physical activity schedule. Involve the person in daily activities as much as possible.  Limit napping during the day.  Limit caffeine.  Attend social events that stimulate rather than overwhelm the senses.  Encourage good nutrition and hydration.  Reduce distractions during meal times and snacks.  Avoid foods that are too hot or too cold.  Monitor chewing and swallowing ability.  Continue with routine vision, hearing, dental, and medical screenings.  Only give over-the-counter or prescription medicines as directed by the caregiver.  Monitor driving abilities. Do not allow the person to drive when safe driving is no longer possible.  Register with an identification program which could provide location assistance in the event of a missing person situation. SEEK MEDICAL CARE IF:  New behavioral problems develop, such as moodiness or aggressiveness.  Any new problem with brain function develop, such as balance, speech, or falling a lot.  Problems with swallowing develop. Small changes or worsening in any aspect of brain function can be a sign that the illness is getting worse. It can also be a sign of another medical illness such as infection. Seeing a caregiver right away is important.  SEEK IMMEDIATE MEDICAL CARE IF:  A fever develops.  Confusion develops or worsens.  New or worsened sleepiness develops or staying awake becomes difficult. Document Released: 08/09/2002 Document Revised: 02/09/2012 Document Reviewed: 07/14/2011 [facility] Patient Information 2015 [place], [place]. This information is not intended to replace advice given to you by your health care provider. Make sure you discuss any questions you have with your health care provider. We will perform a MMSE

## 2014-09-19 NOTE — Progress Notes (Signed)
[facility]   Provider:  Larey Seat, [place] D  Referring Provider: Asencion Noble, MD Primary Care Physician:  Asencion Noble, MD  Chief Complaint  Patient presents with  . RV memory    RM 15, Wife    HPI:  William Ramos is a 80 y.o. caucasian, right handed, married male, who is seen here as a referral from Dr. Willey Blade for a memory disorder work up.    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 Dr. Rollene Rotunda has the following past medical history he has been diagnosed with prostate cancer has a mild form of bulge, gastroesophageal reflux, osteoarthritis, and unspecified hypertension. He underwent a dilation of the esophageal stricture-Barrett's esophagus. He underwent a tonsillectomy. The patient has never smoked may drink one glass of wine a day, he has no family history of dementia his mother died of a heart attack in his father of a stroke. Both parents were of old age.   Mrs. Riordan recalls that about 2 years ago the patient was finally treated with medication for what appeared to have been a gradual onset of memory loss. His wife recalls that there were some stepwise changes on top of the gradual decline but that these seem to have always recovered back to baseline. It does not seem to be a vascular dementia. There has been no history of strokes. The patient had at times problems with anger or impulsivity but this is not longer a problem at this time. He had auditory and visual hallucinations about 2 years ago.  The patient has been treated for his memory loss with Aricept 10 mg daily and Namenda X. are 28 mg daily. Is not longer driving, after driving off to the grocery store and got lost. He found instead a famliar barbeque place , ate breakfast and than continued his journey home form there. The couple  sold the car shortly after in 2014.  He  not walking alone either- except for familiar places in residential surroundings, and never alone.  He quit playing golf, after he became to frustrated with the game.   The wife has removed the firearms form the home and given to the son.  The couple has housekeeping help. 2 of their 3  adult children live in the state and one in [place].        Review of Systems: Out of a complete 14 system review, the patient complains of only the following symptoms, and all other reviewed systems are negative. The patient denies any symptoms, his wife endorsed a good appetite,  Sleep routine from 9 PMand 6 AM and no excessive daytime sleepiness. He naps "out of boredom" .   History   Social History  . Marital Status: Married    Spouse Name: Hoyle Sauer    Number of Children: 3  . Years of Education: M.D.   Occupational History  .     Social History Main Topics  . Smoking status: Never Smoker   . Smokeless tobacco: Never Used  . Alcohol Use: Yes     Comment: 2 glasses of wine daily  . Drug Use: No  . Sexual Activity: Yes    Birth Control/ Protection: None   Other Topics Concern  . Not on file   Social History Narrative   Patient is married Hoyle Sauer) and lives at home with his wife.  Patient is retired.   Patient has three adult children.   Patient is a Sport and exercise psychologist.   Patient is right-handed.   Patient drinks two cups of coffee daily.             Family History  Problem Relation Age of Onset  . Heart attack Mother   . Stroke Father     Past Medical History  Diagnosis Date  . Hypertension   . Gout   . Hypercholesteremia   . Arthritis   . Acid reflux   . Cancer     Cancer-prostate  . Edema   . Psoriasis     Past Surgical History  Procedure Laterality Date  . Cataract extraction w/phaco  07/01/2012    Procedure: CATARACT EXTRACTION PHACO AND INTRAOCULAR LENS PLACEMENT (IOC);  Surgeon: Tonny Branch, MD;  Location: [facility];  Service: Ophthalmology;  Laterality: Right;  CDE  19.91  . Cataract extraction w/phaco  09/02/2012    Procedure: CATARACT EXTRACTION PHACO AND INTRAOCULAR LENS PLACEMENT (IOC);  Surgeon: Tonny Branch, MD;  Location: [facility];  Service: Ophthalmology;  Laterality: Left;  CDE 16.23  . Tonsillectomy    . Dilatation of esophageal stricture      Current Outpatient Prescriptions  Medication Sig Dispense Refill  . amLODipine (NORVASC) 10 MG tablet Take 10 mg by mouth daily.      . beta carotene w/minerals (OCUVITE) tablet Take 1 tablet by mouth daily.      Marland Kitchen donepezil (ARICEPT) 10 MG tablet Take one at breakfast and one at dinner time. Po  60 tablet  5  . etodolac (LODINE) 400 MG tablet Take 400 mg by mouth daily.      . furosemide (LASIX) 20 MG tablet Take 20 mg by mouth every other day.       Marland Kitchen LORazepam (ATIVAN) 0.5 MG tablet Take 0.5 mg by mouth every 6 (six) hours as needed for anxiety.      . Methylcellulose, Laxative, (CITRUCEL PO) Take by mouth. As needed      . omeprazole (PRILOSEC) 20 MG capsule Take 20 mg by mouth daily.      . probenecid (BENEMID) 500 MG tablet Take 500 mg by mouth daily.       . ramipril (ALTACE) 10 MG capsule Take 10 mg by mouth daily.      . risperiDONE (RISPERDAL) 0.25 MG tablet Take 1 tablet (0.25 mg total) by mouth at bedtime.  90 tablet  0  . vitamin B-12 (CYANOCOBALAMIN) 1000 MCG tablet Take 1,000 mcg by mouth daily.       No current facility-administered medications for this visit.   Facility-Administered Medications Ordered in Other Visits  Medication Dose Route Frequency Provider Last Rate Last Dose  . lactated ringers infusion   Intravenous Continuous Lerry Liner, MD      . midazolam (VERSED) injection 1-2 mg  1-2 mg Intravenous Q5 Min x 3 PRN Lerry Liner, MD        Allergies as of 09/19/2014  . (No Known Allergies)    Vitals: BP 131/62  Pulse 62  Temp(Src) 97.1 F (36.2 C) (Oral)  Resp 14  Ht 5\' 7"  (1.702 m)  Wt 170 lb (77.111 kg)  BMI 26.62 kg/m2 Last Weight:  Wt Readings from Last 1  Encounters:  09/19/14 170 lb (77.111 kg)   Last Height:   Ht Readings from Last 1 Encounters:  09/19/14 5\' 7"  (1.702 m)     Physical exam:  General: The patient is awake,  alert and appears not in acute distress. The patient is well groomed. Head: Normocephalic, atraumatic. Neck is supple. Mallampati 2 , neck circumference: 15  Cardiovascular:  Regular rate and rhythm , without  murmurs or carotid bruit, and without distended neck veins. Respiratory: Lungs are clear to auscultation. Skin:  Without evidence of edema, or rash Trunk: BMI is  elevated and patient has normal posture.  Neurologic exam : The patient is awake and alert, oriented to place and time.  Memory subjective described as intact- he is quite unaware of his deficits. MMSE was 11 out of 30, not even the year or the season or the state. There is a normal attention span & concentration ability. Speech is non-fluent with  dysphonia and  aphasia. Mood and affect are appropriate.  Cranial nerves: Pupils are equal and briskly reactive to light. Extraocular movements  in vertical and horizontal planes intact and without nystagmus. Visual fields by finger perimetry are intact. Hearing to finger rub intact.  Facial sensation intact to fine touch. Facial motor strength is symmetric and tongue and uvula move midline.  Motor exam:  diffusely elevated  Tone, cog wheeling over the right more than the left biceps and both wrists.  Normal muscle bulk and normal strength in all extremities.  Sensory:  Fine touch, pinprick and vibration were reduced in his feet,   Proprioception is tested in the upper extremities -normal.  Coordination: Rapid alternating movements in the fingers/hands is tested and slowed . Finger-to-nose maneuver tested and normal without evidence of ataxia, dysmetria just mild tremor.  Gait and station: Patient walks without assistive device. He walks "en bloc' with truncal rigidity, turns with 4-5 steps, and shuffles.   Able to ascend to the exam table.  Strength within normal limits. Stance is  Wide based gait and stance. Tandem gait and Romberg testing is deferred. Fell in the last 6 month once, stumbling over a small stair step. Deep tendon reflexes: in the  upper and lower extremities are symmetric and intact.  Babinski maneuver response is downgoing.   Assessment:  After physical and neurologic examination, review of laboratory studies, imaging, neurophysiology testing and pre-existing records, assessment is 1) by clinical picture and exam, rather  Lewy body dementia than Alzheimer's disease.  I agree with namenda and aricept use, he has no bradycardia. Try Aricept while getting daily hear rate. He uses Namenda but developed diarrhea .I will change to [facility].   2) he is currently not producing visual hallucinations he is using Seroquel as needed.  One a day currently, can use two per day. He sleeps well.   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   Plan:  Treatment plan and additional workup :  l prn for agitation, delusion and hallucinations.  He likes to go to bed at 7 PM .  His wife is opposed to that early bedtime and managed to stretch the time to 8.30 PM. He will use melatonin at  bedtime.  Nocturia  3-5 times. No accidents- prostrate cancer survivor.

## 2014-09-19 NOTE — Addendum Note (Signed)
Addended by: Larey Seat on: 09/19/2014 04:50 PM   Modules accepted: Orders

## 2014-09-20 ENCOUNTER — Telehealth: Payer: Self-pay | Admitting: *Deleted

## 2014-09-20 NOTE — Telephone Encounter (Signed)
Patient was seen yesterday and MD Rx Ativan.  I called the patient to let them know their Rx for Ativan was ready for pickup. Patient was instructed to bring Photo ID.

## 2014-09-25 ENCOUNTER — Ambulatory Visit (INDEPENDENT_AMBULATORY_CARE_PROVIDER_SITE_OTHER): Payer: Medicare Other | Admitting: Radiology

## 2014-09-25 ENCOUNTER — Telehealth: Payer: Self-pay

## 2014-09-25 DIAGNOSIS — G4751 Confusional arousals: Secondary | ICD-10-CM

## 2014-09-25 DIAGNOSIS — F03918 Unspecified dementia, unspecified severity, with other behavioral disturbance: Secondary | ICD-10-CM

## 2014-09-25 DIAGNOSIS — F0391 Unspecified dementia with behavioral disturbance: Secondary | ICD-10-CM

## 2014-09-25 NOTE — Telephone Encounter (Signed)
[employer] notified us they have approved our request for coverage on Namzaric effective until the policy changes or is terminated Ref # [id number]

## 2014-09-28 DIAGNOSIS — G4751 Confusional arousals: Secondary | ICD-10-CM | POA: Insufficient documentation

## 2014-09-28 NOTE — Procedures (Signed)
[facility]  EEG (ELECTROENCEPHALOGRAM) REPORT   STUDY DATE:   09-25-14  PATIENT NAME:  William Ramos, William Ramos.  MRN: [id number]   ORDERING CLINICIAN: Larey Seat, MD ,  PCP Asencion Noble, MD  Linna Hoff    TECHNOLOGIST:  Hassell Done  TECHNIQUE: Electroencephalogram was recorded utilizing standard 10-20 system of lead placement and reformatted into average and bipolar montages.      RECORDING TIME:   30.2 minutes  ACTIVATION:  strobe lights , not hyperventilation    CLINICAL INFORMATION:  Mr Berenson presents with progressive memory loss, for a sleep deprived EEG.      FINDINGS: EEG is symmetric and well organized. There is a posterior dominant Background rhythm of 6  hertz . Photic stimulation caused no entrainment and there were no epileptiform discharges, periodic changes noted.  The patient entered delta sleep.  Hyperventilation was deferred. The EKG heart rates varied from 44 to 48 bpm, SR.     IMPRESSION:  This EEG is abnormal due to generalized slowing, indicative of a encephalopathy due to metabolic disorder, medication influenced or due to degenerative process. This EEG does not document any focal abnormalities or epileptiform discharges.          Larey Seat , MD  Cc Dr Willey Blade  Daughter Hoyle Sauer was called with results.

## 2014-09-29 ENCOUNTER — Telehealth: Payer: Self-pay | Admitting: *Deleted

## 2014-09-29 NOTE — Telephone Encounter (Signed)
No answer with calling with lab results.

## 2014-09-29 NOTE — Telephone Encounter (Signed)
Message copied by Oliver Hum on Fri Sep 29, 2014  1:49 PM ------      Message from: [facility], [place]      Created: Thu Sep 28, 2014  1:16 PM       Generalized slow EEG, consistent with memory decline ------

## 2014-10-03 NOTE — Progress Notes (Signed)
Quick Note:  As per noted on EEG report, daughter Hoyle Sauer was notified of results. ______

## 2014-10-03 NOTE — Telephone Encounter (Signed)
Noted that Hoyle Sauer, daughter had been called with results of EEG on 09-28-14.

## 2014-10-09 ENCOUNTER — Telehealth: Payer: Self-pay | Admitting: Neurology

## 2014-10-09 NOTE — Telephone Encounter (Signed)
Patient's wife requesting patient's EEG results, please return call and advise.

## 2014-10-09 NOTE — Telephone Encounter (Signed)
I called to see why patient had not picked up his Rx for Ativan and his wife Hoyle Sauer told me to just send it to their pharmacy and they will pick it up from there.

## 2014-10-09 NOTE — Telephone Encounter (Signed)
Patients wife was notified of EEG results per Dr. Brett Fairy:  Generalized slow EEG, consistent with memory decline

## 2014-11-29 ENCOUNTER — Telehealth: Payer: Self-pay | Admitting: Neurology

## 2014-11-29 NOTE — Telephone Encounter (Signed)
Patient's wife is calling because patient's condition has changed for the worse. Please call.

## 2014-11-29 NOTE — Telephone Encounter (Signed)
I spoke to wife.   Pt worsening dementia over the last 2 months.   Having SE diarrhea, hiccups- these for last 8-9 days).  Made appt for evaluation tomorrow with Ward Givens, NP at 1000. Wife verbalized understanding.

## 2014-11-30 ENCOUNTER — Ambulatory Visit (INDEPENDENT_AMBULATORY_CARE_PROVIDER_SITE_OTHER): Payer: Medicare Other | Admitting: Adult Health

## 2014-11-30 ENCOUNTER — Encounter: Payer: Self-pay | Admitting: Adult Health

## 2014-11-30 VITALS — BP 124/72 | HR 62 | Ht 67.0 in | Wt 161.8 lb

## 2014-11-30 DIAGNOSIS — F0391 Unspecified dementia with behavioral disturbance: Secondary | ICD-10-CM

## 2014-11-30 DIAGNOSIS — F03918 Unspecified dementia, unspecified severity, with other behavioral disturbance: Secondary | ICD-10-CM

## 2014-11-30 NOTE — Progress Notes (Signed)
PATIENT: William Ramos DOB: 07-01-1928  REASON FOR VISIT: follow up HISTORY FROM: patient  HISTORY OF PRESENT ILLNESS:  William Ramos is a 80 year old male with a history of memory loss. He returns today for follow-up. He is currently taking Namzaric 14-10 mg but he was unable to tolerate it because of diarrhea. The wife states that he tried Namenda prior and it caused diarrhea then.  Wife states that he is having a lot of changes. She states that his motor skills have been affected. He has fallen twice in the last week.  She is having difficulty taking care of him. She is looking to put him in a facility. Wife states that his speech has become more confused. The patient has had the hiccups for going on 15  days. She has spoken with his PCP who has prescribed sleeping agents that has helped some. At home he has become a full assist in the last three weeks. He no longer does much for himself. Wife states that Risperdal has helped with his mood swings. He no longer drives.   The wife wanted him to be evaluated today before she placed him in a facility.   HISTORY 09/19/14 [facility]): William Ramos is a 80 y.o. caucasian, right handed, married male, who is seen here as a referral from Dr. Willey Blade for a memory disorder work up.   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 Dr. Rollene Rotunda has the following past medical history he has been diagnosed with prostate cancer has a mild form of bulge, gastroesophageal reflux, osteoarthritis, and unspecified hypertension. He underwent a dilation of the esophageal stricture-Barrett's esophagus. He underwent a tonsillectomy. The patient has never smoked may drink one glass of wine a day, he has no family history of dementia his mother died of a heart attack in his father of a stroke. Both parents were of old age. Mrs. Mokry recalls that about 2 years ago  the patient was finally treated with medication for what appeared to have been a gradual onset of memory loss. His wife recalls that there were some stepwise changes on top of the gradual decline but that these seem to have always recovered back to baseline. It does not seem to be a vascular dementia. There has been no history of strokes. The patient had at times problems with anger or impulsivity but this is not longer a problem at this time. He had auditory and visual hallucinations about 2 years ago.  The patient has been treated for his memory loss with Aricept 10 mg daily and Namenda X. are 28 mg daily. Is not longer driving, after driving off to the grocery store and got lost. He found instead a famliar barbeque place , ate breakfast and than continued his journey home form there. The couple sold the car shortly after in 2014. He not walking alone either- except for familiar places in residential surroundings, and never alone. He quit playing golf, after he became to frustrated with the game. The wife has removed the firearms form the home and given to the son. The couple has housekeeping help. 2 of their 3 adult children live in the state and one in [place]: Out of a complete 14 system review of symptoms, the patient complains only of the following symptoms, and all other reviewed systems are negative.   activity change, appetite change,  apnea , incontinence of bladder, frequency of urination, walking difficulty , memory loss, speech difficulty, confusion, hallucinations  ALLERGIES: No Known Allergies  HOME MEDICATIONS: Outpatient Prescriptions Prior to Visit  Medication Sig Dispense Refill  . amLODipine (NORVASC) 10 MG tablet Take 10 mg by mouth daily.    . beta carotene w/minerals (OCUVITE) tablet Take 1 tablet by mouth daily.    Marland Kitchen etodolac (LODINE) 400 MG tablet Take 400 mg by mouth daily.    . furosemide (LASIX) 20 MG tablet Take 20 mg by mouth every other day.     Marland Kitchen  LORazepam (ATIVAN) 0.5 MG tablet Take 1 tablet (0.5 mg total) by mouth every 8 (eight) hours as needed for anxiety. 30 tablet 1  . Memantine HCl-Donepezil HCl (NAMZARIC) 14-10 MG CP24 Take 14 mg by mouth 1 day or 1 dose. 30 capsule 5  . Methylcellulose, Laxative, (CITRUCEL PO) Take by mouth. As needed    . omeprazole (PRILOSEC) 20 MG capsule Take 20 mg by mouth daily.    . probenecid (BENEMID) 500 MG tablet Take 500 mg by mouth daily.     . ramipril (ALTACE) 10 MG capsule Take 10 mg by mouth daily.    . risperiDONE (RISPERDAL) 0.25 MG tablet Take 1 tablet (0.25 mg total) by mouth at bedtime. 90 tablet 0  . vitamin B-12 (CYANOCOBALAMIN) 1000 MCG tablet Take 1,000 mcg by mouth daily.     Facility-Administered Medications Prior to Visit  Medication Dose Route Frequency Provider Last Rate Last Dose  . midazolam (VERSED) injection 1-2 mg  1-2 mg Intravenous Q5 Min x 3 PRN Lerry Liner, MD        PAST MEDICAL HISTORY: Past Medical History  Diagnosis Date  . Hypertension   . Gout   . Hypercholesteremia   . Arthritis   . Acid reflux   . Cancer     Cancer-prostate  . Edema   . Psoriasis     PAST SURGICAL HISTORY: Past Surgical History  Procedure Laterality Date  . Cataract extraction w/phaco  07/01/2012    Procedure: CATARACT EXTRACTION PHACO AND INTRAOCULAR LENS PLACEMENT (IOC);  Surgeon: Tonny Branch, MD;  Location: [facility];  Service: Ophthalmology;  Laterality: Right;  CDE 19.91  . Cataract extraction w/phaco  09/02/2012    Procedure: CATARACT EXTRACTION PHACO AND INTRAOCULAR LENS PLACEMENT (IOC);  Surgeon: Tonny Branch, MD;  Location: [facility];  Service: Ophthalmology;  Laterality: Left;  CDE 16.23  . Tonsillectomy    . Dilatation of esophageal stricture      FAMILY HISTORY: Family History  Problem Relation Age of Onset  . Heart attack Mother   . Stroke Father     SOCIAL HISTORY: History   Social History  . Marital Status: Married    Spouse Name: Hoyle Sauer    Number of Children: 3   . Years of Education: M.D.   Occupational History  .     Social History Main Topics  . Smoking status: Never Smoker   . Smokeless tobacco: Never Used  . Alcohol Use: Yes     Comment: 2 glasses of wine daily  . Drug Use: No  . Sexual Activity: Yes    Birth Control/ Protection: None   Other Topics Concern  . Not on file   Social History Narrative   Patient is married Hoyle Sauer) and lives at home with his wife.   Patient is retired.   Patient has three adult children.   Patient is a Sport and exercise psychologist.   Patient is  right-handed.   Patient drinks two cups of coffee daily.               PHYSICAL EXAM  Filed Vitals:   11/30/14 1009  BP: 124/72  Pulse: 62  Height: 5\' 7"  (1.702 m)  Weight: 161 lb 12.8 oz (73.392 kg)   Body mass index is 25.34 kg/(m^2).   Generalized: Well developed,  Patient has the hiccups  Neurological examination  Mentation: Alert   Difficulty following commands , and needs multiple prompting. Unable to do the MMSE. Patient continually repeats himself Cranial nerve II-XII: Pupils were equal round reactive to light. Extraocular movements were full, visual field were full on confrontational test. Facial sensation and strength were normal.  Motor: The motor testing reveals 5 over 5 strength of all 4 extremities. Good symmetric motor tone is noted throughout.  Sensory: Sensory testing is intact to soft touch on all 4 extremities. No evidence of extinction is noted.  Coordination: unable to preform due to cognition Gait and station:  Patient has a shuffling gait when he first starts to ambulate , but then his stride improves. He needs assistance when standing from a sitting position. Reflexes: Deep tendon reflexes are symmetric  But depressed    DIAGNOSTIC DATA (LABS, IMAGING, TESTING) - I reviewed patient records, labs, notes, testing and imaging myself where available.  Lab Results  Component Value Date   HGB 12.9* 08/26/2012   HCT 38.3* 08/26/2012        Component Value Date/Time   NA 139 08/26/2012 1121   K 4.2 08/26/2012 1121   CL 104 08/26/2012 1121   CO2 26 08/26/2012 1121   GLUCOSE 101* 08/26/2012 1121   BUN 24* 08/26/2012 1121   CREATININE 1.15 08/26/2012 1121   CALCIUM 9.7 08/26/2012 1121   GFRNONAA 68* 08/26/2012 1121   GFRAA 66* 08/26/2012 1121      ASSESSMENT AND PLAN 80 y.o. year old male  has a past medical history of Hypertension; Gout; Hypercholesteremia; Arthritis; Acid reflux; Cancer; Edema; and Psoriasis. here with:   1. Dementia   Patient's confusion and motor skills have worsened within the last 3 weeks. The wife states that the patient is now a full assist at home and therefore she is looking to place him in a facility. She does not feel that the patient  is capable of completing any type of brain scans. She feels that will cause agitation.  I will check blood work and a urinalysis today to look for a possible infection that could be making his confusion worse.  However his wife states that she is in a hurry and they may have come back or do the blood work with her primary care provider. The patient is currently not on any medication for his memory loss due to intolerability.  The patient will follow-up in 6 months or sooner if needed.  Ward Givens, MSN, NP-C 11/30/2014, 10:01 AM [facility] [address], [place], [place] [address] [phone]  Note: This document was prepared with digital dictation and possible smart phrase technology. Any transcriptional errors that result from this process are unintentional.

## 2014-11-30 NOTE — Progress Notes (Signed)
I agree with the assessment and plan as directed by NP .The patient is known to me . Hiccups can be treated with Haldol or Orap.    Shelah Heatley, MD

## 2014-11-30 NOTE — Patient Instructions (Signed)

## 2014-12-04 ENCOUNTER — Other Ambulatory Visit: Payer: Self-pay | Admitting: Neurology

## 2014-12-04 NOTE — Telephone Encounter (Signed)
Rx signed and faxed.

## 2014-12-11 ENCOUNTER — Emergency Department (HOSPITAL_COMMUNITY): Payer: Medicare Other

## 2014-12-11 ENCOUNTER — Emergency Department (HOSPITAL_COMMUNITY)
Admission: EM | Admit: 2014-12-11 | Discharge: 2014-12-11 | Disposition: A | Discharge status: Home / Self Care | Payer: Medicare Other | Attending: Emergency Medicine | Admitting: Emergency Medicine

## 2014-12-11 ENCOUNTER — Encounter (HOSPITAL_COMMUNITY): Payer: Self-pay | Admitting: Emergency Medicine

## 2014-12-11 DIAGNOSIS — Y92128 Other place in nursing home as the place of occurrence of the external cause: Secondary | ICD-10-CM | POA: Diagnosis not present

## 2014-12-11 DIAGNOSIS — Z872 Personal history of diseases of the skin and subcutaneous tissue: Secondary | ICD-10-CM | POA: Diagnosis not present

## 2014-12-11 DIAGNOSIS — W1839XA Other fall on same level, initial encounter: Secondary | ICD-10-CM | POA: Diagnosis not present

## 2014-12-11 DIAGNOSIS — K219 Gastro-esophageal reflux disease without esophagitis: Secondary | ICD-10-CM | POA: Insufficient documentation

## 2014-12-11 DIAGNOSIS — Z791 Long term (current) use of non-steroidal anti-inflammatories (NSAID): Secondary | ICD-10-CM | POA: Diagnosis not present

## 2014-12-11 DIAGNOSIS — M109 Gout, unspecified: Secondary | ICD-10-CM | POA: Diagnosis not present

## 2014-12-11 DIAGNOSIS — Z23 Encounter for immunization: Secondary | ICD-10-CM | POA: Insufficient documentation

## 2014-12-11 DIAGNOSIS — Y9389 Activity, other specified: Secondary | ICD-10-CM | POA: Diagnosis not present

## 2014-12-11 DIAGNOSIS — T148XXA Other injury of unspecified body region, initial encounter: Secondary | ICD-10-CM

## 2014-12-11 DIAGNOSIS — M1990 Unspecified osteoarthritis, unspecified site: Secondary | ICD-10-CM | POA: Insufficient documentation

## 2014-12-11 DIAGNOSIS — Y998 Other external cause status: Secondary | ICD-10-CM | POA: Insufficient documentation

## 2014-12-11 DIAGNOSIS — S50311A Abrasion of right elbow, initial encounter: Secondary | ICD-10-CM | POA: Diagnosis not present

## 2014-12-11 DIAGNOSIS — G3183 Dementia with Lewy bodies: Secondary | ICD-10-CM | POA: Diagnosis not present

## 2014-12-11 DIAGNOSIS — I10 Essential (primary) hypertension: Secondary | ICD-10-CM | POA: Diagnosis not present

## 2014-12-11 DIAGNOSIS — Z79899 Other long term (current) drug therapy: Secondary | ICD-10-CM | POA: Insufficient documentation

## 2014-12-11 DIAGNOSIS — Z8546 Personal history of malignant neoplasm of prostate: Secondary | ICD-10-CM | POA: Insufficient documentation

## 2014-12-11 DIAGNOSIS — Z8639 Personal history of other endocrine, nutritional and metabolic disease: Secondary | ICD-10-CM | POA: Diagnosis not present

## 2014-12-11 DIAGNOSIS — S59901A Unspecified injury of right elbow, initial encounter: Secondary | ICD-10-CM | POA: Diagnosis present

## 2014-12-11 DIAGNOSIS — I959 Hypotension, unspecified: Secondary | ICD-10-CM | POA: Insufficient documentation

## 2014-12-11 DIAGNOSIS — S50312A Abrasion of left elbow, initial encounter: Secondary | ICD-10-CM | POA: Diagnosis not present

## 2014-12-11 DIAGNOSIS — W19XXXA Unspecified fall, initial encounter: Secondary | ICD-10-CM

## 2014-12-11 LAB — BASIC METABOLIC PANEL
Anion gap: 9 (ref 5–15)
BUN: 28 mg/dL — AB (ref 6–23)
CO2: 26 mmol/L (ref 19–32)
Calcium: 9 mg/dL (ref 8.4–10.5)
Chloride: 110 mEq/L (ref 96–112)
Creatinine, Ser: 1.31 mg/dL (ref 0.50–1.35)
GFR calc Af Amer: 56 mL/min — ABNORMAL LOW (ref >90)
GFR, EST NON AFRICAN AMERICAN: 48 mL/min — AB (ref >90)
GLUCOSE: 106 mg/dL — AB (ref 70–99)
POTASSIUM: 3.5 mmol/L (ref 3.5–5.1)
SODIUM: 145 mmol/L (ref 135–145)

## 2014-12-11 LAB — URINE MICROSCOPIC-ADD ON

## 2014-12-11 LAB — CBC
HEMATOCRIT: 35.1 % — AB (ref 39.0–52.0)
HEMOGLOBIN: 11.4 g/dL — AB (ref 13.0–17.0)
MCH: 32.1 pg (ref 26.0–34.0)
MCHC: 32.5 g/dL (ref 30.0–36.0)
MCV: 98.9 fL (ref 78.0–100.0)
PLATELETS: 229 10*3/uL (ref 150–400)
RBC: 3.55 MIL/uL — ABNORMAL LOW (ref 4.22–5.81)
RDW: 13 % (ref 11.5–15.5)
WBC: 8.8 10*3/uL (ref 4.0–10.5)

## 2014-12-11 LAB — URINALYSIS, ROUTINE W REFLEX MICROSCOPIC
Glucose, UA: NEGATIVE mg/dL
Ketones, ur: NEGATIVE mg/dL
LEUKOCYTES UA: NEGATIVE
Nitrite: NEGATIVE
PH: 5 (ref 5.0–8.0)
Protein, ur: NEGATIVE mg/dL
SPECIFIC GRAVITY, URINE: 1.024 (ref 1.005–1.030)
Urobilinogen, UA: 0.2 mg/dL (ref 0.0–1.0)

## 2014-12-11 MED ORDER — LORAZEPAM 2 MG/ML IJ SOLN
0.5000 mg | Freq: Once | INTRAMUSCULAR | Status: AC
  Administered 2014-12-11: 0.5 mg via INTRAVENOUS
  Filled 2014-12-11: qty 1

## 2014-12-11 MED ORDER — TETANUS-DIPHTH-ACELL PERTUSSIS 5-2.5-18.5 LF-MCG/0.5 IM SUSP
0.5000 mL | Freq: Once | INTRAMUSCULAR | Status: AC
  Administered 2014-12-11: 0.5 mL via INTRAMUSCULAR
  Filled 2014-12-11: qty 0.5

## 2014-12-11 NOTE — ED Provider Notes (Signed)
CSN: [id number]     Arrival date & time 12/11/14  1107 History   First MD Initiated Contact with Patient 12/11/14 1207     Chief Complaint  Patient presents with  . Fall  . Hypotension     (Consider location/radiation/quality/duration/timing/severity/associated sxs/prior Treatment) Patient is a 81 y.o. male presenting with fall. The history is provided by the EMS personnel and the spouse.  Fall This is a recurrent problem. The current episode started 1 to 2 hours ago. Episode frequency: once. The problem has been resolved. Pertinent negatives include no chest pain, no abdominal pain, no headaches and no shortness of breath. Nothing aggravates the symptoms. Nothing relieves the symptoms. He has tried nothing for the symptoms. The treatment provided no relief.    Past Medical History  Diagnosis Date  . Hypertension   . Gout   . Hypercholesteremia   . Arthritis   . Acid reflux   . Cancer     Cancer-prostate  . Edema   . Psoriasis    Past Surgical History  Procedure Laterality Date  . Cataract extraction w/phaco  07/01/2012    Procedure: CATARACT EXTRACTION PHACO AND INTRAOCULAR LENS PLACEMENT (IOC);  Surgeon: Tonny Branch, MD;  Location: [facility];  Service: Ophthalmology;  Laterality: Right;  CDE 19.91  . Cataract extraction w/phaco  09/02/2012    Procedure: CATARACT EXTRACTION PHACO AND INTRAOCULAR LENS PLACEMENT (IOC);  Surgeon: Tonny Branch, MD;  Location: [facility];  Service: Ophthalmology;  Laterality: Left;  CDE 16.23  . Tonsillectomy    . Dilatation of esophageal stricture     Family History  Problem Relation Age of Onset  . Heart attack Mother   . Stroke Father    History  Substance Use Topics  . Smoking status: Never Smoker   . Smokeless tobacco: Never Used  . Alcohol Use: Yes     Comment: 2 glasses of wine daily    Review of Systems  Constitutional: Negative for fever.  HENT: Negative for drooling and rhinorrhea.   Eyes: Negative for pain.  Respiratory: Negative for  cough and shortness of breath.   Cardiovascular: Negative for chest pain and leg swelling.  Gastrointestinal: Negative for nausea, vomiting, abdominal pain and diarrhea.  Genitourinary: Negative for dysuria and hematuria.  Musculoskeletal: Negative for gait problem and neck pain.  Skin: Negative for color change.  Neurological: Negative for numbness and headaches.       Falls  Hematological: Negative for adenopathy.  Psychiatric/Behavioral: Negative for behavioral problems.  All other systems reviewed and are negative.     Allergies  Review of patient's allergies indicates no known allergies.  Home Medications   Prior to Admission medications   Medication Sig Start Date End Date Taking? Authorizing Provider  amLODipine (NORVASC) 10 MG tablet Take 10 mg by mouth daily with breakfast.    Yes Historical Provider, MD  beta carotene w/minerals (OCUVITE) tablet Take 1 tablet by mouth daily with breakfast.    Yes Historical Provider, MD  chlorproMAZINE (THORAZINE) 25 MG tablet Take 25 mg by mouth every 6 (six) hours as needed (for hiccups).   Yes Historical Provider, MD  donepezil (ARICEPT) 10 MG tablet Take 10 mg by mouth daily with breakfast.   Yes Historical Provider, MD  etodolac (LODINE) 400 MG tablet Take 400 mg by mouth daily with breakfast.    Yes Historical Provider, MD  furosemide (LASIX) 20 MG tablet Take 20 mg by mouth daily with breakfast.    Yes Historical Provider, MD  loperamide (  IMODIUM) 2 MG capsule Take 2 mg by mouth every 4 (four) hours as needed for diarrhea or loose stools.   Yes Historical Provider, MD  LORazepam (ATIVAN) 0.5 MG tablet Take 0.5 mg by mouth every 6 (six) hours as needed for anxiety.   Yes Historical Provider, MD  memantine (NAMENDA XR) 28 MG CP24 24 hr capsule Take 28 mg by mouth daily with breakfast.   Yes Historical Provider, MD  omeprazole (PRILOSEC) 20 MG capsule Take 20 mg by mouth daily with breakfast.    Yes Historical Provider, MD  probenecid  (BENEMID) 500 MG tablet Take 500 mg by mouth daily with breakfast.    Yes Historical Provider, MD  ramipril (ALTACE) 10 MG capsule Take 10 mg by mouth daily with breakfast.    Yes Historical Provider, MD  risperiDONE (RISPERDAL) 0.25 MG tablet Take 1 tablet (0.25 mg total) by mouth at bedtime. 06/19/14  Yes Carmen Dohmeier, MD  LORazepam (ATIVAN) 0.5 MG tablet TAKE 1 TABLET BY MOUTH EVERY EIGHT HOURS AS NEEDED FOR ANXIETY. Patient not taking: Reported on 12/11/2014 12/04/14   Larey Seat, MD  Memantine HCl-Donepezil HCl [facility]) 14-10 MG CP24 Take 14 mg by mouth 1 day or 1 dose. Patient not taking: Reported on 12/11/2014 09/19/14   Asencion Partridge Dohmeier, MD   BP 125/57 mmHg  Pulse 70  Temp(Src) 98.1 F (36.7 C) (Oral)  SpO2 100% Physical Exam  Constitutional: He appears well-developed and well-nourished.  HENT:  Head: Normocephalic and atraumatic.  Right Ear: External ear normal.  Left Ear: External ear normal.  Nose: Nose normal.  Mouth/Throat: Oropharynx is clear and moist. No oropharyngeal exudate.  Eyes: Conjunctivae and EOM are normal. Pupils are equal, round, and reactive to light.  Neck: Normal range of motion. Neck supple.  No vertebral tenderness noted.  Cardiovascular: Normal rate, regular rhythm, normal heart sounds and intact distal pulses.  Exam reveals no gallop and no friction rub.   No murmur heard. Pulmonary/Chest: Effort normal and breath sounds normal. No respiratory distress. He has no wheezes.  Abdominal: Soft. Bowel sounds are normal. He exhibits no distension. There is no tenderness. There is no rebound and no guarding.  Musculoskeletal: Normal range of motion. He exhibits no edema or tenderness.  Abrasions to the lateral aspect of bilateral elbows.  Neurological: He is alert.  A/o x 1.   The patient moves all extremities without any obvious pain. He appears to have normal strength.  Skin: Skin is warm and dry.  Psychiatric: He has a normal mood and affect. His  behavior is normal.  Nursing note and vitals reviewed.   ED Course  Procedures (including critical care time) Labs Review Labs Reviewed  CBC - Abnormal; Notable for the following:    RBC 3.55 (*)    Hemoglobin 11.4 (*)    HCT 35.1 (*)    All other components within normal limits  BASIC METABOLIC PANEL - Abnormal; Notable for the following:    Glucose, Bld 106 (*)    BUN 28 (*)    GFR calc non Af Amer 48 (*)    GFR calc Af Amer 56 (*)    All other components within normal limits  URINALYSIS, ROUTINE W REFLEX MICROSCOPIC - Abnormal; Notable for the following:    Hgb urine dipstick LARGE (*)    Bilirubin Urine LARGE (*)    All other components within normal limits  URINE MICROSCOPIC-ADD ON    Imaging Review Dg Pelvis 1-2 Views  12/11/2014   CLINICAL  DATA:  Fell on tailbone with pain.  EXAM: PELVIS - 1-2 VIEW  COMPARISON:  None.  FINDINGS: Single frontal view of the pelvis shows no definite fracture. Visualization of the lower sacrum and coccyx is limited by overlying bowel gas.  IMPRESSION: No definite acute fracture.   Electronically Signed   By: Lorin Picket M.D.   On: 12/11/2014 13:07     EKG Interpretation None      MDM   Final diagnoses:  Fall  Abrasion    12:33 PM 81 y.o. male w hx of HTN, lewy body dementia who presents with an unwitnessed fall at his facility today. His wife accompanies him on exam. EMS reported an initial blood pressure with a systolic of 70. He got tender 50 mL IV fluid in route. All subsequent blood pressures since that time have been normal. He is afebrile and vital signs are unremarkable here. He appears to be at baseline per his wife but mildly more agitated than normal. I discussed the workup with his wife. We discussed the option of CT scanning his head and neck but decided that this was not necessary if no significant intervention would be made anyway. We decided to get screening lab work, urinalysis, and screening plain film of the  pelvis.  Will update tdap.  2:13 PM: BP remains stable, slightly soft but pt is resting w/ normal HR in no acute distress on my exam. He was able to airway and ambulate a short distance. I interpreted/reviewed the labs and/or imaging which were non-contributory.    I have discussed the diagnosis/risks/treatment options with the spouse and believe the pt to be eligible for discharge home to follow-up with his pcp. We also discussed returning to the ED immediately if new or worsening sx occur. We discussed the sx which are most concerning (e.g., further falls, fever, AMS) that necessitate immediate return. Medications administered to the patient during their visit and any new prescriptions provided to the patient are listed below.  Medications given during this visit Medications  LORazepam (ATIVAN) injection 0.5 mg (0.5 mg Intravenous Given 12/11/14 1216)  Tdap (BOOSTRIX) injection 0.5 mL (0.5 mLs Intramuscular Given 12/11/14 1319)    New Prescriptions   No medications on file      Pamella Pert, MD 12/11/14 1607

## 2014-12-11 NOTE — ED Notes (Addendum)
Pt here from [facility], Alzheimer unit. Pt was initially hypotensive, had fall today has been having recent falls lately. Normotensive upon arrival. 250 cc NS given in route 18 g in left forearm.

## 2014-12-11 NOTE — ED Notes (Signed)
Bed: WA13 Expected date:  Expected time:  Means of arrival:  Comments: EMS-fall 

## 2014-12-23 ENCOUNTER — Encounter (HOSPITAL_COMMUNITY): Payer: Self-pay | Admitting: Emergency Medicine

## 2014-12-23 ENCOUNTER — Emergency Department (HOSPITAL_COMMUNITY)
Admission: EM | Admit: 2014-12-23 | Discharge: 2014-12-23 | Disposition: A | Discharge status: Home / Self Care | Payer: Medicare Other | Attending: Emergency Medicine | Admitting: Emergency Medicine

## 2014-12-23 DIAGNOSIS — Z043 Encounter for examination and observation following other accident: Secondary | ICD-10-CM | POA: Diagnosis present

## 2014-12-23 DIAGNOSIS — Z791 Long term (current) use of non-steroidal anti-inflammatories (NSAID): Secondary | ICD-10-CM | POA: Insufficient documentation

## 2014-12-23 DIAGNOSIS — K219 Gastro-esophageal reflux disease without esophagitis: Secondary | ICD-10-CM | POA: Diagnosis not present

## 2014-12-23 DIAGNOSIS — Z8546 Personal history of malignant neoplasm of prostate: Secondary | ICD-10-CM | POA: Diagnosis not present

## 2014-12-23 DIAGNOSIS — Z79899 Other long term (current) drug therapy: Secondary | ICD-10-CM | POA: Insufficient documentation

## 2014-12-23 DIAGNOSIS — M109 Gout, unspecified: Secondary | ICD-10-CM | POA: Diagnosis not present

## 2014-12-23 DIAGNOSIS — I10 Essential (primary) hypertension: Secondary | ICD-10-CM | POA: Diagnosis not present

## 2014-12-23 DIAGNOSIS — W19XXXA Unspecified fall, initial encounter: Secondary | ICD-10-CM

## 2014-12-23 DIAGNOSIS — Z8639 Personal history of other endocrine, nutritional and metabolic disease: Secondary | ICD-10-CM | POA: Insufficient documentation

## 2014-12-23 DIAGNOSIS — Z872 Personal history of diseases of the skin and subcutaneous tissue: Secondary | ICD-10-CM | POA: Diagnosis not present

## 2014-12-23 DIAGNOSIS — W01198A Fall on same level from slipping, tripping and stumbling with subsequent striking against other object, initial encounter: Secondary | ICD-10-CM | POA: Insufficient documentation

## 2014-12-23 LAB — I-STAT CHEM 8, ED
BUN: 28 mg/dL — ABNORMAL HIGH (ref 6–23)
CREATININE: 1.2 mg/dL (ref 0.50–1.35)
Calcium, Ion: 1.14 mmol/L (ref 1.13–1.30)
Chloride: 105 mmol/L (ref 96–112)
GLUCOSE: 101 mg/dL — AB (ref 70–99)
HEMATOCRIT: 36 % — AB (ref 39.0–52.0)
Hemoglobin: 12.2 g/dL — ABNORMAL LOW (ref 13.0–17.0)
Potassium: 3.8 mmol/L (ref 3.5–5.1)
Sodium: 143 mmol/L (ref 135–145)
TCO2: 26 mmol/L (ref 0–100)

## 2014-12-23 NOTE — ED Notes (Signed)
Bed: FT73 Expected date:  Expected time:  Means of arrival:  Comments: fall

## 2014-12-23 NOTE — ED Notes (Signed)
Pt from [facility] via EMS after an unwitnessed fall. Staff reports that they heard him hit his head on the handrail when falling. No injuries noted and pt does not verbalized pain. Pt has hx of Alzheimer's and is in the lockdown unit at facility. Pt is alert but oriented to his his baseline per facility staff.

## 2014-12-23 NOTE — ED Provider Notes (Signed)
CSN: [id number]     Arrival date & time 12/23/14  1605 History   First MD Initiated Contact with Patient 12/23/14 1608     Chief Complaint  Patient presents with  . Fall     Level V caveat: Dementia  HPI Patient brought to the emergency department after question of a fall at the nursing facility.  This is unwitnessed.  They're unsure as to whether or not he hit his head.  No signs of trauma to his head.  Patient complains of no pain.  He denies chest pain.  Denies hip pain.  Full range of motion bilateral hips at the bedside.  Patient has been in his normal state health most far.  Reported that the patient is at baseline mental status.   Past Medical History  Diagnosis Date  . Hypertension   . Gout   . Hypercholesteremia   . Arthritis   . Acid reflux   . Cancer     Cancer-prostate  . Edema   . Psoriasis    Past Surgical History  Procedure Laterality Date  . Cataract extraction w/phaco  07/01/2012    Procedure: CATARACT EXTRACTION PHACO AND INTRAOCULAR LENS PLACEMENT (IOC);  Surgeon: Tonny Branch, MD;  Location: [facility];  Service: Ophthalmology;  Laterality: Right;  CDE 19.91  . Cataract extraction w/phaco  09/02/2012    Procedure: CATARACT EXTRACTION PHACO AND INTRAOCULAR LENS PLACEMENT (IOC);  Surgeon: Tonny Branch, MD;  Location: [facility];  Service: Ophthalmology;  Laterality: Left;  CDE 16.23  . Tonsillectomy    . Dilatation of esophageal stricture     Family History  Problem Relation Age of Onset  . Heart attack Mother   . Stroke Father    History  Substance Use Topics  . Smoking status: Never Smoker   . Smokeless tobacco: Never Used  . Alcohol Use: Yes     Comment: 2 glasses of wine daily    Review of Systems  Unable to perform ROS     Allergies  Review of patient's allergies indicates no known allergies.  Home Medications   Prior to Admission medications   Medication Sig Start Date End Date Taking? Authorizing Provider  amLODipine (NORVASC) 10 MG tablet Take  10 mg by mouth daily with breakfast.     Historical Provider, MD  beta carotene w/minerals (OCUVITE) tablet Take 1 tablet by mouth daily with breakfast.     Historical Provider, MD  chlorproMAZINE (THORAZINE) 25 MG tablet Take 25 mg by mouth every 6 (six) hours as needed (for hiccups).    Historical Provider, MD  donepezil (ARICEPT) 10 MG tablet Take 10 mg by mouth daily with breakfast.    Historical Provider, MD  etodolac (LODINE) 400 MG tablet Take 400 mg by mouth daily with breakfast.     Historical Provider, MD  furosemide (LASIX) 20 MG tablet Take 20 mg by mouth daily with breakfast.     Historical Provider, MD  loperamide (IMODIUM) 2 MG capsule Take 2 mg by mouth every 4 (four) hours as needed for diarrhea or loose stools.    Historical Provider, MD  LORazepam (ATIVAN) 0.5 MG tablet TAKE 1 TABLET BY MOUTH EVERY EIGHT HOURS AS NEEDED FOR ANXIETY. Patient not taking: Reported on 12/11/2014 12/04/14   Larey Seat, MD  LORazepam (ATIVAN) 0.5 MG tablet Take 0.5 mg by mouth every 6 (six) hours as needed for anxiety.    Historical Provider, MD  memantine (NAMENDA XR) 28 MG CP24 24 hr capsule Take 28 mg  by mouth daily with breakfast.    Historical Provider, MD  Memantine HCl-Donepezil HCl (NAMZARIC) 14-10 MG CP24 Take 14 mg by mouth 1 day or 1 dose. Patient not taking: Reported on 12/11/2014 09/19/14   Larey Seat, MD  omeprazole (PRILOSEC) 20 MG capsule Take 20 mg by mouth daily with breakfast.     Historical Provider, MD  probenecid (BENEMID) 500 MG tablet Take 500 mg by mouth daily with breakfast.     Historical Provider, MD  ramipril (ALTACE) 10 MG capsule Take 10 mg by mouth daily with breakfast.     Historical Provider, MD  risperiDONE (RISPERDAL) 0.25 MG tablet Take 1 tablet (0.25 mg total) by mouth at bedtime. 06/19/14   Carmen Dohmeier, MD   BP 91/59 mmHg  Pulse 74  Temp(Src) 98.5 F (36.9 C) (Oral)  Resp 18  SpO2 92% Physical Exam  Constitutional: He appears well-developed and  well-nourished.  HENT:  Head: Normocephalic and atraumatic.  No bruising, hematoma or other signs of trauma to his head or face  Eyes: EOM are normal.  Neck: Normal range of motion.  No cervical tenderness  Cardiovascular: Normal rate, regular rhythm, normal heart sounds and intact distal pulses.   Pulmonary/Chest: Effort normal and breath sounds normal. No respiratory distress.  Abdominal: Soft. He exhibits no distension. There is no tenderness.  Musculoskeletal: Normal range of motion.  Full range of motion bilateral hips, knees, ankles.  Full range of motion of bilateral wrists, elbows, shoulders.  No cervical thoracic or lumbar point tenderness  Neurological: He is alert.  Skin: Skin is warm and dry.  Psychiatric: He has a normal mood and affect. Judgment normal.  Nursing note and vitals reviewed.   ED Course  Procedures (including critical care time) Labs Review Labs Reviewed  I-STAT CHEM 8, ED    Imaging Review No results found.   EKG Interpretation None      MDM   Final diagnoses:  None    Patient is overall well-appearing.  There are no signs of trauma to this patient.  She has full range of motion bilateral hips.  Patient is not on any anticoagulants.  Low suspicion for intracranial injury.  No vomiting.  No complaints of headache at this time.  Discharge back to the nursing facility with strict return precautions.    Hoy Morn, MD 12/23/14 [phone]

## 2014-12-23 NOTE — ED Notes (Signed)
Tech placed bed alarm d/t pt attempting to get out of bed multiple times

## 2014-12-23 NOTE — ED Notes (Signed)
[facility] notified to transport pt to facility

## 2014-12-23 NOTE — Discharge Instructions (Signed)

## 2014-12-23 NOTE — ED Notes (Signed)
Attempted to call report to [facility]. There was no answer and vmail system did not allow this RN to leave message. No other contact numbers worked to leave vmail either. Pt transported back to facility [facility]. [facility] drivers will notify facility that they were not able to be contacted for report

## 2014-12-26 ENCOUNTER — Emergency Department (HOSPITAL_COMMUNITY)
Admission: EM | Admit: 2014-12-26 | Discharge: 2014-12-26 | Disposition: A | Discharge status: Home / Self Care | Payer: Medicare Other | Attending: Emergency Medicine | Admitting: Emergency Medicine

## 2014-12-26 ENCOUNTER — Encounter (HOSPITAL_COMMUNITY): Payer: Self-pay | Admitting: Emergency Medicine

## 2014-12-26 DIAGNOSIS — M109 Gout, unspecified: Secondary | ICD-10-CM | POA: Diagnosis not present

## 2014-12-26 DIAGNOSIS — Z8639 Personal history of other endocrine, nutritional and metabolic disease: Secondary | ICD-10-CM | POA: Insufficient documentation

## 2014-12-26 DIAGNOSIS — K219 Gastro-esophageal reflux disease without esophagitis: Secondary | ICD-10-CM | POA: Diagnosis not present

## 2014-12-26 DIAGNOSIS — Y92129 Unspecified place in nursing home as the place of occurrence of the external cause: Secondary | ICD-10-CM | POA: Insufficient documentation

## 2014-12-26 DIAGNOSIS — M1990 Unspecified osteoarthritis, unspecified site: Secondary | ICD-10-CM | POA: Insufficient documentation

## 2014-12-26 DIAGNOSIS — Y998 Other external cause status: Secondary | ICD-10-CM | POA: Diagnosis not present

## 2014-12-26 DIAGNOSIS — Y9389 Activity, other specified: Secondary | ICD-10-CM | POA: Diagnosis not present

## 2014-12-26 DIAGNOSIS — Z8546 Personal history of malignant neoplasm of prostate: Secondary | ICD-10-CM | POA: Diagnosis not present

## 2014-12-26 DIAGNOSIS — W1839XA Other fall on same level, initial encounter: Secondary | ICD-10-CM | POA: Diagnosis not present

## 2014-12-26 DIAGNOSIS — I10 Essential (primary) hypertension: Secondary | ICD-10-CM | POA: Diagnosis not present

## 2014-12-26 DIAGNOSIS — Z043 Encounter for examination and observation following other accident: Secondary | ICD-10-CM | POA: Diagnosis present

## 2014-12-26 DIAGNOSIS — W19XXXA Unspecified fall, initial encounter: Secondary | ICD-10-CM

## 2014-12-26 LAB — COMPREHENSIVE METABOLIC PANEL
ALK PHOS: 89 U/L (ref 39–117)
ALT: 86 U/L — ABNORMAL HIGH (ref 0–53)
AST: 54 U/L — AB (ref 0–37)
Albumin: 3.1 g/dL — ABNORMAL LOW (ref 3.5–5.2)
Anion gap: 6 (ref 5–15)
BILIRUBIN TOTAL: 0.5 mg/dL (ref 0.3–1.2)
BUN: 34 mg/dL — AB (ref 6–23)
CALCIUM: 8.7 mg/dL (ref 8.4–10.5)
CO2: 29 mmol/L (ref 19–32)
Chloride: 107 mmol/L (ref 96–112)
Creatinine, Ser: 1.26 mg/dL (ref 0.50–1.35)
GFR calc non Af Amer: 51 mL/min — ABNORMAL LOW (ref >90)
GFR, EST AFRICAN AMERICAN: 59 mL/min — AB (ref >90)
GLUCOSE: 111 mg/dL — AB (ref 70–99)
Potassium: 4.1 mmol/L (ref 3.5–5.1)
Sodium: 142 mmol/L (ref 135–145)
TOTAL PROTEIN: 5.9 g/dL — AB (ref 6.0–8.3)

## 2014-12-26 LAB — CBC WITH DIFFERENTIAL/PLATELET
Basophils Absolute: 0 10*3/uL (ref 0.0–0.1)
Basophils Relative: 0 % (ref 0–1)
Eosinophils Absolute: 0.1 10*3/uL (ref 0.0–0.7)
Eosinophils Relative: 1 % (ref 0–5)
HCT: 33.5 % — ABNORMAL LOW (ref 39.0–52.0)
Hemoglobin: 10.8 g/dL — ABNORMAL LOW (ref 13.0–17.0)
Lymphocytes Relative: 9 % — ABNORMAL LOW (ref 12–46)
Lymphs Abs: 0.8 10*3/uL (ref 0.7–4.0)
MCH: 31.8 pg (ref 26.0–34.0)
MCHC: 32.2 g/dL (ref 30.0–36.0)
MCV: 98.5 fL (ref 78.0–100.0)
MONOS PCT: 9 % (ref 3–12)
Monocytes Absolute: 0.7 10*3/uL (ref 0.1–1.0)
NEUTROS ABS: 6.9 10*3/uL (ref 1.7–7.7)
NEUTROS PCT: 81 % — AB (ref 43–77)
Platelets: 175 10*3/uL (ref 150–400)
RBC: 3.4 MIL/uL — ABNORMAL LOW (ref 4.22–5.81)
RDW: 13.8 % (ref 11.5–15.5)
WBC: 8.5 10*3/uL (ref 4.0–10.5)

## 2014-12-26 NOTE — Progress Notes (Signed)
CSW met with patient at bedside. There was no family present. Patient confirms that lives at [facility]. Also, patient confirmed that he fell today while at the facility. Patient could not recall what caused him to fall today. Per note, patient has dementia.  Willette Brace [phone] ED CSW 12/26/2014 10:45 PM

## 2014-12-26 NOTE — ED Notes (Signed)
Bed: DQ22 Expected date:  Expected time:  Means of arrival:  Comments: Bed 12, EMS, 63 M, Fall

## 2014-12-26 NOTE — Discharge Instructions (Signed)
Return to the ED with any concerns including vomiting, seizure activity, abdominal pain, decreased level of alertness/lethargy, or any other alarming symptoms  His liver function tests were mildly elevated, these should be rechecked in the next 1 week

## 2014-12-26 NOTE — ED Notes (Signed)
Report given back to [facility]. Patient taken home by [facility].

## 2014-12-26 NOTE — ED Notes (Signed)
Pt is from [facility] [phone]) and had an unwitnessed fall tonight. Staff says that he did hit his head. Heard him fall, went in to assess patient and he was at his baseline mentally which is dementia. Alert. C/O lower back pain. No grimace on palpation. No deformity noted.

## 2014-12-27 NOTE — ED Provider Notes (Signed)
CSN: [id number]     Arrival date & time 12/26/14  2024 History   First MD Initiated Contact with Patient 12/26/14 2032     Chief Complaint  Patient presents with  . Fall     (Consider location/radiation/quality/duration/timing/severity/associated sxs/prior Treatment) HPI  A LEVEL 5 CAVEAT PERTAINS DUE TO DEMENTIA Pt presents from [facility] after fall tonight.  Staff heard him fall went in to check and he was awake, alert and at his baseline.  He does have hx of dementia.  Pt has no vomiting or seizure activity.  No c/o pain.    Past Medical History  Diagnosis Date  . Hypertension   . Gout   . Hypercholesteremia   . Arthritis   . Acid reflux   . Cancer     Cancer-prostate  . Edema   . Psoriasis    Past Surgical History  Procedure Laterality Date  . Cataract extraction w/phaco  07/01/2012    Procedure: CATARACT EXTRACTION PHACO AND INTRAOCULAR LENS PLACEMENT (IOC);  Surgeon: Tonny Branch, MD;  Location: [facility];  Service: Ophthalmology;  Laterality: Right;  CDE 19.91  . Cataract extraction w/phaco  09/02/2012    Procedure: CATARACT EXTRACTION PHACO AND INTRAOCULAR LENS PLACEMENT (IOC);  Surgeon: Tonny Branch, MD;  Location: [facility];  Service: Ophthalmology;  Laterality: Left;  CDE 16.23  . Tonsillectomy    . Dilatation of esophageal stricture     Family History  Problem Relation Age of Onset  . Heart attack Mother   . Stroke Father    History  Substance Use Topics  . Smoking status: Never Smoker   . Smokeless tobacco: Never Used  . Alcohol Use: Yes     Comment: 2 glasses of wine daily    Review of Systems  UNABLE TO OBTAIN ROS DUE TO LEVEL 5 CAVEAT    Allergies  Review of patient's allergies indicates no known allergies.  Home Medications   Prior to Admission medications   Medication Sig Start Date End Date Taking? Authorizing Provider  beta carotene w/minerals (OCUVITE) tablet Take 1 tablet by mouth daily with breakfast.    Yes Historical Provider, MD   donepezil (ARICEPT) 10 MG tablet Take 10 mg by mouth daily with breakfast.   Yes Historical Provider, MD  etodolac (LODINE) 400 MG tablet Take 400 mg by mouth daily with breakfast.    Yes Historical Provider, MD  furosemide (LASIX) 20 MG tablet Take 20 mg by mouth daily with breakfast.    Yes Historical Provider, MD  loperamide (IMODIUM) 2 MG capsule Take 2 mg by mouth every 4 (four) hours as needed for diarrhea or loose stools.   Yes Historical Provider, MD  LORazepam (ATIVAN) 0.5 MG tablet Take 0.5 mg by mouth every 6 (six) hours as needed for anxiety.   Yes Historical Provider, MD  memantine (NAMENDA XR) 28 MG CP24 24 hr capsule Take 28 mg by mouth daily with breakfast.   Yes Historical Provider, MD  omeprazole (PRILOSEC) 20 MG capsule Take 20 mg by mouth daily with breakfast.    Yes Historical Provider, MD  probenecid (BENEMID) 500 MG tablet Take 500 mg by mouth daily with breakfast.    Yes Historical Provider, MD  ramipril (ALTACE) 10 MG capsule Take 10 mg by mouth daily with breakfast.    Yes Historical Provider, MD  risperiDONE (RISPERDAL) 0.25 MG tablet Take 1 tablet (0.25 mg total) by mouth at bedtime. 06/19/14  Yes Asencion Partridge Dohmeier, MD  chlorproMAZINE (THORAZINE) 25 MG tablet  Take 25 mg by mouth every 6 (six) hours as needed (for hiccups).    Historical Provider, MD  LORazepam (ATIVAN) 0.5 MG tablet TAKE 1 TABLET BY MOUTH EVERY EIGHT HOURS AS NEEDED FOR ANXIETY. Patient not taking: Reported on 12/11/2014 12/04/14   Larey Seat, MD  Memantine HCl-Donepezil HCl [facility]) 14-10 MG CP24 Take 14 mg by mouth 1 day or 1 dose. Patient not taking: Reported on 12/11/2014 09/19/14   Asencion Partridge Dohmeier, MD   BP 107/91 mmHg  Pulse 67  Temp(Src) 98.7 F (37.1 C) (Oral)  Resp 16  SpO2 100%  Vitals reviewed Physical Exam  Physical Examination: General appearance - alert, well appearing, and in no distress Mental status - alert, oriented to person, not to place or time Head- NCAT Eyes - pupils  equal and reactive, no conjunctival injection, no scleral icterus Mouth - mucous membranes moist, pharynx normal without lesions Neck - no midline tenderness to palpation, FROM without pain Chest - clear to auscultation, no wheezes, rales or rhonchi, symmetric air entry Heart - normal rate, regular rhythm, normal S1, S2, no murmurs, rubs, clicks or gallops Abdomen - soft, nontender, nondistended, no masses or organomegaly Back exam - no midline tenderness to palpation, no CVA tenderness Neurological - alert, orientedx 1, normal speech, moving all extremities purposefully Extremities - peripheral pulses normal, no pedal edema, no clubbing or cyanosis Skin - normal coloration and turgor, no rashes  ED Course  Procedures (including critical care time) Labs Review Labs Reviewed  CBC WITH DIFFERENTIAL/PLATELET - Abnormal; Notable for the following:    RBC 3.40 (*)    Hemoglobin 10.8 (*)    HCT 33.5 (*)    Neutrophils Relative % 81 (*)    Lymphocytes Relative 9 (*)    All other components within normal limits  COMPREHENSIVE METABOLIC PANEL - Abnormal; Notable for the following:    Glucose, Bld 111 (*)    BUN 34 (*)    Total Protein 5.9 (*)    Albumin 3.1 (*)    AST 54 (*)    ALT 86 (*)    GFR calc non Af Amer 51 (*)    GFR calc Af Amer 59 (*)    All other components within normal limits    Imaging Review No results found.   EKG Interpretation None      MDM   Final diagnoses:  Fall, initial encounter    Pt presenting c/o fall.  He is at his mental status baseline.  No c/o pain.  Pt has no signs of injury on exam.  Feel patient can be safely discharged back to nursing facility.      Threasa Beards, MD 12/27/14 [phone]

## 2015-01-02 ENCOUNTER — Encounter: Payer: Self-pay | Admitting: Adult Health

## 2015-01-02 ENCOUNTER — Ambulatory Visit (INDEPENDENT_AMBULATORY_CARE_PROVIDER_SITE_OTHER): Payer: Medicare Other | Admitting: Adult Health

## 2015-01-02 ENCOUNTER — Ambulatory Visit: Payer: Medicare Other | Admitting: Neurology

## 2015-01-02 VITALS — Ht 67.0 in | Wt 165.0 lb

## 2015-01-02 DIAGNOSIS — R451 Restlessness and agitation: Secondary | ICD-10-CM

## 2015-01-02 DIAGNOSIS — F03918 Unspecified dementia, unspecified severity, with other behavioral disturbance: Secondary | ICD-10-CM

## 2015-01-02 DIAGNOSIS — F0391 Unspecified dementia with behavioral disturbance: Secondary | ICD-10-CM

## 2015-01-02 MED ORDER — RISPERIDONE 0.25 MG PO TABS: 0.2500 mg | ORAL_TABLET | Freq: Two times a day (BID) | ORAL | Status: DC

## 2015-01-02 NOTE — Progress Notes (Signed)
PATIENT: William Ramos DOB: Oct 25, 1928  REASON FOR VISIT: follow up HISTORY FROM: patient  HISTORY OF PRESENT ILLNESS: William Ramos is a 81 year old male with a history of memory loss. He is currently taking Aricept for his memory and Risperdal for his behavior. Wife brings him back today reporting that his behavior has declined over the last month. He moved into a new facility and at the facility they were giving the patient 1 tablet of Risperdal whereas the wife was giving him 1 tablet in the morning and 1 tablet in the evening. The patient has fallen several times at the facility and has been to the hospital. He had blood work at the hospital on 1/26 it did not indicate infection. Patient is very uncooperative today and trying to leave the room continuously.   [name] is a 81 year old male with a history of memory loss. He returns today for follow-up. He is currently taking Namzaric 14-10 mg but he was unable to tolerate it because of diarrhea. The wife states that he tried Namenda prior and it caused diarrhea then. Wife states that he is having a lot of changes. She states that his motor skills have been affected. He has fallen twice in the last week. She is having difficulty taking care of him. She is looking to put him in a facility. Wife states that his speech has become more confused. The patient has had the hiccups for going on 15 days. She has spoken with his PCP who has prescribed sleeping agents that has helped some. At home he has become a full assist in the last three weeks. He no longer does much for himself. Wife states that Risperdal has helped with his mood swings. He no longer drives. The wife wanted him to be evaluated today before she placed him in a facility.   HISTORY 09/19/14 [facility]): William Ramos is a 81 y.o. caucasian, right handed, married male, who is seen here as a referral from Dr. Willey Blade for a memory disorder work up.  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 Dr. Rollene Rotunda has the following past medical history he has been diagnosed with prostate cancer has a mild form of bulge, gastroesophageal reflux, osteoarthritis, and unspecified hypertension. He underwent a dilation of the esophageal stricture-Barrett's esophagus. He underwent a tonsillectomy. The patient has never smoked may drink one glass of wine a day, he has no family history of dementia his mother died of a heart attack in his father of a stroke. Both parents were of old age. William Ramos recalls that about 2 years ago the patient was finally treated with medication for what appeared to have been a gradual onset of memory loss. His wife recalls that there were some stepwise changes on top of the gradual decline but that these seem to have always recovered back to baseline. It does not seem to be a vascular dementia. There has been no history of strokes. The patient had at times problems with anger or impulsivity but this is not longer a problem at this time. He had auditory and visual hallucinations about 2 years ago.  The patient has been treated for his memory loss with Aricept 10 mg daily and Namenda X. are 28 mg daily. Is not longer driving, after driving off to the grocery store and got lost. He found instead a famliar barbeque place , ate  breakfast and than continued his journey home form there. The couple sold the car shortly after in 2014. He not walking alone either- except for familiar places in residential surroundings, and never alone. He quit playing golf, after he became to frustrated with the game. The wife has removed the firearms form the home and given to the son. The couple has housekeeping help. 2 of their 3 adult children live in the state and one in [place]: Out of a complete 14 system review of symptoms, the patient complains only of the  following symptoms, and all other reviewed systems are negative.  Appetite change, diarrhea, incontinence of bowels, incontinence of bladder, walking difficulty, agitation, confusion, anxious.   ALLERGIES: No Known Allergies  HOME MEDICATIONS: Outpatient Prescriptions Prior to Visit  Medication Sig Dispense Refill  . beta carotene w/minerals (OCUVITE) tablet Take 1 tablet by mouth daily with breakfast.     . chlorproMAZINE (THORAZINE) 25 MG tablet Take 25 mg by mouth every 6 (six) hours as needed (for hiccups).    . donepezil (ARICEPT) 10 MG tablet Take 10 mg by mouth daily with breakfast.    . etodolac (LODINE) 400 MG tablet Take 400 mg by mouth daily with breakfast.     . furosemide (LASIX) 20 MG tablet Take 20 mg by mouth daily with breakfast.     . loperamide (IMODIUM) 2 MG capsule Take 2 mg by mouth every 4 (four) hours as needed for diarrhea or loose stools.    Marland Kitchen LORazepam (ATIVAN) 0.5 MG tablet TAKE 1 TABLET BY MOUTH EVERY EIGHT HOURS AS NEEDED FOR ANXIETY. 30 tablet 5  . LORazepam (ATIVAN) 0.5 MG tablet Take 0.5 mg by mouth every 6 (six) hours as needed for anxiety.    . Memantine HCl-Donepezil HCl (NAMZARIC) 14-10 MG CP24 Take 14 mg by mouth 1 day or 1 dose. 30 capsule 5  . omeprazole (PRILOSEC) 20 MG capsule Take 20 mg by mouth daily with breakfast.     . probenecid (BENEMID) 500 MG tablet Take 500 mg by mouth daily with breakfast.     . ramipril (ALTACE) 10 MG capsule Take 10 mg by mouth daily with breakfast.     . risperiDONE (RISPERDAL) 0.25 MG tablet Take 1 tablet (0.25 mg total) by mouth at bedtime. 90 tablet 0  . memantine (NAMENDA XR) 28 MG CP24 24 hr capsule Take 28 mg by mouth daily with breakfast.     Facility-Administered Medications Prior to Visit  Medication Dose Route Frequency Provider Last Rate Last Dose  . midazolam (VERSED) injection 1-2 mg  1-2 mg Intravenous Q5 Min x 3 PRN Lerry Liner, MD        PAST MEDICAL HISTORY: Past Medical History  Diagnosis Date   . Hypertension   . Gout   . Hypercholesteremia   . Arthritis   . Acid reflux   . Cancer     Cancer-prostate  . Edema   . Psoriasis     PAST SURGICAL HISTORY: Past Surgical History  Procedure Laterality Date  . Cataract extraction w/phaco  07/01/2012    Procedure: CATARACT EXTRACTION PHACO AND INTRAOCULAR LENS PLACEMENT (IOC);  Surgeon: Tonny Branch, MD;  Location: [facility];  Service: Ophthalmology;  Laterality: Right;  CDE 19.91  . Cataract extraction w/phaco  09/02/2012    Procedure: CATARACT EXTRACTION PHACO AND INTRAOCULAR LENS PLACEMENT (IOC);  Surgeon: Tonny Branch, MD;  Location: [facility];  Service: Ophthalmology;  Laterality: Left;  CDE 16.23  . Tonsillectomy    .  Dilatation of esophageal stricture      FAMILY HISTORY: Family History  Problem Relation Age of Onset  . Heart attack Mother   . Stroke Father     PHYSICAL EXAM  Filed Vitals:   01/02/15 1422  Height: 5\' 7"  (1.702 m)  Weight: 165 lb (74.844 kg)   Body mass index is 25.84 kg/(m^2).  Generalized: Patient agitation, walking in the room, cursing at times.   Patient will not let me do an exam.   .   DIAGNOSTIC DATA (LABS, IMAGING, TESTING) - I reviewed patient records, labs, notes, testing and imaging myself where available.  Lab Results  Component Value Date   WBC 8.5 12/26/2014   HGB 10.8* 12/26/2014   HCT 33.5* 12/26/2014   MCV 98.5 12/26/2014   PLT 175 12/26/2014      Component Value Date/Time   NA 142 12/26/2014 2042   K 4.1 12/26/2014 2042   CL 107 12/26/2014 2042   CO2 29 12/26/2014 2042   GLUCOSE 111* 12/26/2014 2042   BUN 34* 12/26/2014 2042   CREATININE 1.26 12/26/2014 2042   CALCIUM 8.7 12/26/2014 2042   PROT 5.9* 12/26/2014 2042   ALBUMIN 3.1* 12/26/2014 2042   AST 54* 12/26/2014 2042   ALT 86* 12/26/2014 2042   ALKPHOS 89 12/26/2014 2042   BILITOT 0.5 12/26/2014 2042   GFRNONAA 63* 12/26/2014 2042   GFRAA 42* 12/26/2014 2042    ASSESSMENT AND PLAN 81 y.o. year old male  has a  past medical history of Hypertension; Gout; Hypercholesteremia; Arthritis; Acid reflux; Cancer; Edema; and Psoriasis. here with:  1. Dementia with memory disturbance  Respirdal increased to 0.25 mg 1 tablet BID.  Continue Aricept. Blood work at the hospital did not indicate an acute infection. If agitation does not improve she should let me know.  F/u in 2 months or sooner if needed.     Ward Givens, MSN, NP-C 01/02/2015, 2:37 PM [facility] [address], [place], [place] [address] [phone]  Note: This document was prepared with digital dictation and possible smart phrase technology. Any transcriptional errors that result from this process are unintentional.

## 2015-01-02 NOTE — Patient Instructions (Signed)
Continue Aricept Increase Risperdal to 1 tablet twice a day. If his agitation does not improve please let us know.

## 2015-01-04 ENCOUNTER — Encounter (HOSPITAL_COMMUNITY): Payer: Self-pay

## 2015-01-04 ENCOUNTER — Emergency Department (HOSPITAL_COMMUNITY): Payer: Medicare Other

## 2015-01-04 ENCOUNTER — Emergency Department (HOSPITAL_COMMUNITY)
Admission: EM | Admit: 2015-01-04 | Discharge: 2015-01-04 | Disposition: A | Discharge status: Home / Self Care | Payer: Medicare Other | Attending: Emergency Medicine | Admitting: Emergency Medicine

## 2015-01-04 DIAGNOSIS — W19XXXA Unspecified fall, initial encounter: Secondary | ICD-10-CM

## 2015-01-04 DIAGNOSIS — K219 Gastro-esophageal reflux disease without esophagitis: Secondary | ICD-10-CM | POA: Diagnosis not present

## 2015-01-04 DIAGNOSIS — G309 Alzheimer's disease, unspecified: Secondary | ICD-10-CM | POA: Insufficient documentation

## 2015-01-04 DIAGNOSIS — S0093XA Contusion of unspecified part of head, initial encounter: Secondary | ICD-10-CM | POA: Insufficient documentation

## 2015-01-04 DIAGNOSIS — I10 Essential (primary) hypertension: Secondary | ICD-10-CM | POA: Diagnosis not present

## 2015-01-04 DIAGNOSIS — M109 Gout, unspecified: Secondary | ICD-10-CM | POA: Insufficient documentation

## 2015-01-04 DIAGNOSIS — Y9389 Activity, other specified: Secondary | ICD-10-CM | POA: Insufficient documentation

## 2015-01-04 DIAGNOSIS — Z79899 Other long term (current) drug therapy: Secondary | ICD-10-CM | POA: Insufficient documentation

## 2015-01-04 DIAGNOSIS — S0990XA Unspecified injury of head, initial encounter: Secondary | ICD-10-CM | POA: Diagnosis present

## 2015-01-04 DIAGNOSIS — E780 Pure hypercholesterolemia: Secondary | ICD-10-CM | POA: Diagnosis not present

## 2015-01-04 DIAGNOSIS — Z8546 Personal history of malignant neoplasm of prostate: Secondary | ICD-10-CM | POA: Diagnosis not present

## 2015-01-04 DIAGNOSIS — Y998 Other external cause status: Secondary | ICD-10-CM | POA: Diagnosis not present

## 2015-01-04 DIAGNOSIS — W07XXXA Fall from chair, initial encounter: Secondary | ICD-10-CM | POA: Insufficient documentation

## 2015-01-04 DIAGNOSIS — S40022A Contusion of left upper arm, initial encounter: Secondary | ICD-10-CM

## 2015-01-04 DIAGNOSIS — Y92129 Unspecified place in nursing home as the place of occurrence of the external cause: Secondary | ICD-10-CM | POA: Diagnosis not present

## 2015-01-04 HISTORY — DX: Dementia in other diseases classified elsewhere, unspecified severity, without behavioral disturbance, psychotic disturbance, mood disturbance, and anxiety: F02.80

## 2015-01-04 HISTORY — DX: Alzheimer's disease, unspecified: G30.9

## 2015-01-04 NOTE — ED Notes (Signed)
Pt in bed w/ bed alarm

## 2015-01-04 NOTE — ED Notes (Signed)
A call placed to [facility] for transportation back to [employer] [place]. [facility] to transport patient.

## 2015-01-04 NOTE — Discharge Instructions (Signed)
Facial or Scalp Contusion A facial or scalp contusion is a deep bruise on the face or head. Injuries to the face and head generally cause a lot of swelling, especially around the eyes. Contusions are the result of an injury that caused bleeding under the skin. The contusion may turn blue, purple, or yellow. Minor injuries will give you a painless contusion, but more severe contusions may stay painful and swollen for a few weeks.  CAUSES  A facial or scalp contusion is caused by a blunt injury or trauma to the face or head area.  SIGNS AND SYMPTOMS   Swelling of the injured area.   Discoloration of the injured area.   Tenderness, soreness, or pain in the injured area.  DIAGNOSIS  The diagnosis can be made by taking a medical history and doing a physical exam. An X-ray exam, CT scan, or MRI may be needed to determine if there are any associated injuries, such as broken bones (fractures). TREATMENT  Often, the best treatment for a facial or scalp contusion is applying cold compresses to the injured area. Over-the-counter medicines may also be recommended for pain control.  HOME CARE INSTRUCTIONS   Only take over-the-counter or prescription medicines as directed by your health care provider.   Apply ice to the injured area.   Put ice in a plastic bag.   Place a towel between your skin and the bag.   Leave the ice on for 20 minutes, 2-3 times a day.  SEEK MEDICAL CARE IF:  You have bite problems.   You have pain with chewing.   You are concerned about facial defects. SEEK IMMEDIATE MEDICAL CARE IF:  You have severe pain or a headache that is not relieved by medicine.   You have unusual sleepiness, confusion, or personality changes.   You throw up (vomit).   You have a persistent nosebleed.   You have double vision or blurred vision.   You have fluid drainage from your nose or ear.   You have difficulty walking or using your arms or legs.  MAKE SURE YOU:    Understand these instructions.  Will watch your condition.  Will get help right away if you are not doing well or get worse. Document Released: 12/25/2004 Document Revised: 09/07/2013 Document Reviewed: 06/30/2013 [facility] Patient Information 2015 [place], [place]. This information is not intended to replace advice given to you by your health care provider. Make sure you discuss any questions you have with your health care provider. Abrasion An abrasion is a cut or scrape of the skin. Abrasions do not extend through all layers of the skin and most heal within 10 days. It is important to care for your abrasion properly to prevent infection. CAUSES  Most abrasions are caused by falling on, or gliding across, the ground or other surface. When your skin rubs on something, the outer and inner layer of skin rubs off, causing an abrasion. DIAGNOSIS  Your caregiver will be able to diagnose an abrasion during a physical exam.  TREATMENT  Your treatment depends on how large and deep the abrasion is. Generally, your abrasion will be cleaned with water and a mild soap to remove any dirt or debris. An antibiotic ointment may be put over the abrasion to prevent an infection. A bandage (dressing) may be wrapped around the abrasion to keep it from getting dirty.  You may need a tetanus shot if:  You cannot remember when you had your last tetanus shot.  You have never  had a tetanus shot.  The injury broke your skin. If you get a tetanus shot, your arm may swell, get red, and feel warm to the touch. This is common and not a problem. If you need a tetanus shot and you choose not to have one, there is a rare chance of getting tetanus. Sickness from tetanus can be serious.  HOME CARE INSTRUCTIONS   If a dressing was applied, change it at least once a day or as directed by your caregiver. If the bandage sticks, soak it off with warm water.   Wash the area with water and a mild soap to remove all the  ointment 2 times a day. Rinse off the soap and pat the area dry with a clean towel.   Reapply any ointment as directed by your caregiver. This will help prevent infection and keep the bandage from sticking. Use gauze over the wound and under the dressing to help keep the bandage from sticking.   Change your dressing right away if it becomes wet or dirty.   Only take over-the-counter or prescription medicines for pain, discomfort, or fever as directed by your caregiver.   Follow up with your caregiver within 24-48 hours for a wound check, or as directed. If you were not given a wound-check appointment, look closely at your abrasion for redness, swelling, or pus. These are signs of infection. SEEK IMMEDIATE MEDICAL CARE IF:   You have increasing pain in the wound.   You have redness, swelling, or tenderness around the wound.   You have pus coming from the wound.   You have a fever or persistent symptoms for more than 2-3 days.  You have a fever and your symptoms suddenly get worse.  You have a bad smell coming from the wound or dressing.  MAKE SURE YOU:   Understand these instructions.  Will watch your condition.  Will get help right away if you are not doing well or get worse. Document Released: 08/27/2005 Document Revised: 11/03/2012 Document Reviewed: 10/21/2011 [facility] Patient Information 2015 [place], [place]. This information is not intended to replace advice given to you by your health care provider. Make sure you discuss any questions you have with your health care provider.

## 2015-01-04 NOTE — ED Provider Notes (Signed)
CSN: [id number]     Arrival date & time 01/04/15  1256 History   First MD Initiated Contact with Patient 01/04/15 1517     Chief Complaint  Patient presents with  . Fall     Patient is a 81 y.o. male presenting with fall. The history is provided by the nursing home and the EMS personnel.  Fall   William Ramos presents for evaluation of injuries following a fall.  Level V caveat due to dementia.  Pt fell approximately one hour prior to ED arrival. He fell trying to sit on the edge of a chair and the chair flipped over.  No LOC, no vomiting.  He was agitated after the fall.  Risperdal dosing was increased two days ago due to agitation.  He walks without assistance.  He is alert to name only.  He may give one word answers at times.  There has been no recent illness.    Past Medical History  Diagnosis Date  . Hypertension   . Gout   . Hypercholesteremia   . Arthritis   . Acid reflux   . Cancer     Cancer-prostate  . Edema   . Psoriasis   . Alzheimer disease    Past Surgical History  Procedure Laterality Date  . Cataract extraction w/phaco  07/01/2012    Procedure: CATARACT EXTRACTION PHACO AND INTRAOCULAR LENS PLACEMENT (IOC);  Surgeon: Tonny Branch, MD;  Location: [facility];  Service: Ophthalmology;  Laterality: Right;  CDE 19.91  . Cataract extraction w/phaco  09/02/2012    Procedure: CATARACT EXTRACTION PHACO AND INTRAOCULAR LENS PLACEMENT (IOC);  Surgeon: Tonny Branch, MD;  Location: [facility];  Service: Ophthalmology;  Laterality: Left;  CDE 16.23  . Tonsillectomy    . Dilatation of esophageal stricture     Family History  Problem Relation Age of Onset  . Heart attack Mother   . Stroke Father    History  Substance Use Topics  . Smoking status: Never Smoker   . Smokeless tobacco: Never Used  . Alcohol Use: Yes     Comment: 2 glasses of wine daily    Review of Systems  Unable to perform ROS     Allergies  Review of patient's allergies indicates no known allergies.  Home  Medications   Prior to Admission medications   Medication Sig Start Date End Date Taking? Authorizing Provider  acetaminophen (TYLENOL) 325 MG tablet Take 650 mg by mouth every 6 (six) hours as needed for moderate pain.   Yes Historical Provider, MD  beta carotene w/minerals (OCUVITE) tablet Take 1 tablet by mouth daily with breakfast.    Yes Historical Provider, MD  chlorproMAZINE (THORAZINE) 25 MG tablet Take 25 mg by mouth every 6 (six) hours as needed (for hiccups).   Yes Historical Provider, MD  donepezil (ARICEPT) 10 MG tablet Take 10 mg by mouth daily with breakfast.   Yes Historical Provider, MD  etodolac (LODINE) 400 MG tablet Take 400 mg by mouth daily with breakfast.    Yes Historical Provider, MD  furosemide (LASIX) 20 MG tablet Take 20 mg by mouth daily with breakfast.    Yes Historical Provider, MD  loperamide (IMODIUM) 2 MG capsule Take 2 mg by mouth every 4 (four) hours as needed for diarrhea or loose stools.   Yes Historical Provider, MD  LORazepam (ATIVAN) 0.5 MG tablet TAKE 1 TABLET BY MOUTH EVERY EIGHT HOURS AS NEEDED FOR ANXIETY. Patient taking differently: TAKE 1 TABLET BY MOUTH EVERY SIX HOURS  AS NEEDED FOR ANXIETY. 12/04/14  Yes Asencion Partridge Dohmeier, MD  memantine (NAMENDA XR) 14 MG CP24 24 hr capsule Take 14 mg by mouth every other day.   Yes Historical Provider, MD  omeprazole (PRILOSEC) 20 MG capsule Take 20 mg by mouth daily with breakfast.    Yes Historical Provider, MD  probenecid (BENEMID) 500 MG tablet Take 500 mg by mouth daily with breakfast.    Yes Historical Provider, MD  ramipril (ALTACE) 10 MG capsule Take 10 mg by mouth daily with breakfast.    Yes Historical Provider, MD  risperiDONE (RISPERDAL) 0.25 MG tablet Take 1 tablet (0.25 mg total) by mouth 2 (two) times daily. 01/02/15  Yes Ward Givens, NP  memantine (NAMENDA XR) 7 MG CP24 24 hr capsule Take 7 mg by mouth every other day.    Historical Provider, MD   BP 129/83 mmHg  Pulse 76  Temp(Src) 98 F (36.7 C)  (Oral)  Resp 20  SpO2 99% Physical Exam  Constitutional: He appears well-developed and well-nourished.  HENT:  Head: Normocephalic.  Left forehead abrasion  Eyes: EOM are normal. Pupils are equal, round, and reactive to light.  Neck:  ccollar in place, no c spine tenderness to palpation.    Cardiovascular: Normal rate and regular rhythm.   Pulmonary/Chest: Effort normal and breath sounds normal. No respiratory distress. He exhibits no tenderness.  Musculoskeletal: He exhibits no edema or tenderness.  No tenderness in all four extremities.  Abrasions over right dorsal hand.  Abrasion/skin tear over left dorsal forearm.    Neurological: He is alert.  Disoriented to place and time.  MAE symmetrically.    Skin: Skin is warm and dry.  Psychiatric:  Calm.  Nursing note and vitals reviewed.   ED Course  Procedures (including critical care time) Labs Review Labs Reviewed - No data to display  Imaging Review Ct Head Wo Contrast  01/04/2015   CLINICAL DATA:  Nursing home patient.  Fall 1 hr ago.  Dementia.  EXAM: CT HEAD WITHOUT CONTRAST  CT CERVICAL SPINE WITHOUT CONTRAST  TECHNIQUE: Multidetector CT imaging of the head and cervical spine was performed following the standard protocol without intravenous contrast. Multiplanar CT image reconstructions of the cervical spine were also generated.  COMPARISON:  11/04/2011  FINDINGS: CT HEAD FINDINGS  There is prominence of the sulci and ventricles compatible with brain atrophy. Diffuse low attenuation throughout the subcortical and periventricular white matter is identified compatible with brain atrophy. Right posterior temporal lobe and occipital lobe encephalomalacia is new from previous exam compatible with chronic infarct. There is no evidence for acute brain infarct, hemorrhage or mass.The paranasal sinuses and mastoid air cells are clear.  CT CERVICAL SPINE FINDINGS  Normal alignment of the cervical spine. There is multi level disc space  narrowing and ventral endplate spurring compatible with degenerative disc disease. This is most advanced at C5-6 and C6-7. The facet joints are all well aligned. There is no fracture or subluxation identified.  IMPRESSION: 1. No acute intracranial abnormalities and no evidence for cervical spine fracture. 2. Small vessel ischemic disease and brain atrophy. 3. Right posterior temporal and occipital encephalomalacia compatible with chronic infarct. New from previous exam 4. Cervical spondylosis noted.   Electronically Signed   By: William Ramos M.D.   On: 01/04/2015 17:21   Ct Cervical Spine Wo Contrast  01/04/2015   CLINICAL DATA:  Nursing home patient.  Fall 1 hr ago.  Dementia.  EXAM: CT HEAD WITHOUT CONTRAST  CT CERVICAL SPINE  WITHOUT CONTRAST  TECHNIQUE: Multidetector CT imaging of the head and cervical spine was performed following the standard protocol without intravenous contrast. Multiplanar CT image reconstructions of the cervical spine were also generated.  COMPARISON:  11/04/2011  FINDINGS: CT HEAD FINDINGS  There is prominence of the sulci and ventricles compatible with brain atrophy. Diffuse low attenuation throughout the subcortical and periventricular white matter is identified compatible with brain atrophy. Right posterior temporal lobe and occipital lobe encephalomalacia is new from previous exam compatible with chronic infarct. There is no evidence for acute brain infarct, hemorrhage or mass.The paranasal sinuses and mastoid air cells are clear.  CT CERVICAL SPINE FINDINGS  Normal alignment of the cervical spine. There is multi level disc space narrowing and ventral endplate spurring compatible with degenerative disc disease. This is most advanced at C5-6 and C6-7. The facet joints are all well aligned. There is no fracture or subluxation identified.  IMPRESSION: 1. No acute intracranial abnormalities and no evidence for cervical spine fracture. 2. Small vessel ischemic disease and brain atrophy.  3. Right posterior temporal and occipital encephalomalacia compatible with chronic infarct. New from previous exam 4. Cervical spondylosis noted.   Electronically Signed   By: William Ramos M.D.   On: 01/04/2015 17:21     EKG Interpretation None      MDM   Final diagnoses:  Fall, initial encounter  Head contusion, initial encounter  Arm contusion, left, initial encounter    Patient with history of advanced dementia here for evaluation of injuries following a fall. There is no historical components concerning for acute illness. Patient is able to ambulate at his baseline. There is no evidence of acute fractures on exam, he is able to range all extremities without difficulty and does not appear tender on examination. CT scan with chronic changes of the brain and C-spine. Plan to discharge back to nursing facility with local wound care to his abrasions and PCP follow-up.    Quintella Reichert, MD 01/04/15 1758

## 2015-01-04 NOTE — ED Notes (Signed)
Patient transported to CT 

## 2015-01-04 NOTE — ED Notes (Signed)
Unable to give a person report at [facility]. A message left with the facility and instructed to call the ED if they have any questions.

## 2015-01-04 NOTE — ED Notes (Signed)
Bed: [facility] Expected date:  Expected time:  Means of arrival:  Comments: EMS- fall, dementia, c-collar

## 2015-01-04 NOTE — ED Notes (Signed)
Pt from [place] home. Per ems per staff, pt fell 1 hour ago. Hx of demantia, pt is alert to person only, per staff pt is more agitated today . C collar on , no loc, Hx of fall. No anticoagulant take.

## 2015-01-04 NOTE — Progress Notes (Signed)
CSW met with patient at bedside. There was no family present. Patient confirms that he fell today. Patient states " I feel pretty good", and says that he is able to complete his ADL's independently.   Patient appeared to have laceration on his left elbow and left side of head. Per note, patient will be transported back to [facility].  Willette Brace [phone] ED CSW 01/04/2015 9:33 PM

## 2015-01-04 NOTE — ED Notes (Signed)
C-collar remains on patient.  

## 2015-01-09 ENCOUNTER — Emergency Department (HOSPITAL_COMMUNITY): Payer: Medicare Other

## 2015-01-09 ENCOUNTER — Emergency Department (HOSPITAL_COMMUNITY)
Admission: EM | Admit: 2015-01-09 | Discharge: 2015-01-10 | Disposition: A | Discharge status: Home / Self Care | Payer: Medicare Other | Attending: Emergency Medicine | Admitting: Emergency Medicine

## 2015-01-09 DIAGNOSIS — S0001XA Abrasion of scalp, initial encounter: Secondary | ICD-10-CM | POA: Insufficient documentation

## 2015-01-09 DIAGNOSIS — Z8546 Personal history of malignant neoplasm of prostate: Secondary | ICD-10-CM | POA: Insufficient documentation

## 2015-01-09 DIAGNOSIS — G309 Alzheimer's disease, unspecified: Secondary | ICD-10-CM | POA: Insufficient documentation

## 2015-01-09 DIAGNOSIS — Z79899 Other long term (current) drug therapy: Secondary | ICD-10-CM | POA: Insufficient documentation

## 2015-01-09 DIAGNOSIS — S60511A Abrasion of right hand, initial encounter: Secondary | ICD-10-CM | POA: Diagnosis not present

## 2015-01-09 DIAGNOSIS — Z8639 Personal history of other endocrine, nutritional and metabolic disease: Secondary | ICD-10-CM | POA: Diagnosis not present

## 2015-01-09 DIAGNOSIS — I10 Essential (primary) hypertension: Secondary | ICD-10-CM | POA: Diagnosis not present

## 2015-01-09 DIAGNOSIS — W19XXXA Unspecified fall, initial encounter: Secondary | ICD-10-CM | POA: Insufficient documentation

## 2015-01-09 DIAGNOSIS — F0280 Dementia in other diseases classified elsewhere without behavioral disturbance: Secondary | ICD-10-CM | POA: Insufficient documentation

## 2015-01-09 DIAGNOSIS — Y9289 Other specified places as the place of occurrence of the external cause: Secondary | ICD-10-CM | POA: Diagnosis not present

## 2015-01-09 DIAGNOSIS — Z872 Personal history of diseases of the skin and subcutaneous tissue: Secondary | ICD-10-CM | POA: Diagnosis not present

## 2015-01-09 DIAGNOSIS — Y9389 Activity, other specified: Secondary | ICD-10-CM | POA: Insufficient documentation

## 2015-01-09 DIAGNOSIS — Y998 Other external cause status: Secondary | ICD-10-CM | POA: Insufficient documentation

## 2015-01-09 DIAGNOSIS — K219 Gastro-esophageal reflux disease without esophagitis: Secondary | ICD-10-CM | POA: Insufficient documentation

## 2015-01-09 DIAGNOSIS — M1990 Unspecified osteoarthritis, unspecified site: Secondary | ICD-10-CM | POA: Diagnosis not present

## 2015-01-09 DIAGNOSIS — S0990XA Unspecified injury of head, initial encounter: Secondary | ICD-10-CM | POA: Diagnosis present

## 2015-01-09 NOTE — ED Notes (Signed)
Bed: WA09 Expected date: 01/09/15 Expected time: 10:52 PM Means of arrival: Ambulance Comments: Fall, skin tears

## 2015-01-09 NOTE — Discharge Instructions (Signed)
Fall Prevention and Home Safety Mr. Stefan, your CT scan did not show any injury. Follow-up with your primary care physician to prevent future falls at home. If symptoms worsen come back to emergency department immediately. Thank you. Falls cause injuries and can affect all age groups. It is possible to prevent falls.  HOW TO PREVENT FALLS  Wear shoes with rubber soles that do not have an opening for your toes.  Keep the inside and outside of your house well lit.  Use night lights throughout your home.  Remove clutter from floors.  Clean up floor spills.  Remove throw rugs or fasten them to the floor with carpet tape.  Do not place electrical cords across pathways.  Put grab bars by your tub, shower, and toilet. Do not use towel bars as grab bars.  Put handrails on both sides of the stairway. Fix loose handrails.  Do not climb on stools or stepladders, if possible.  Do not wax your floors.  Repair uneven or unsafe sidewalks, walkways, or stairs.  Keep items you use a lot within reach.  Be aware of pets.  Keep emergency numbers next to the telephone.  Put smoke detectors in your home and near bedrooms. Ask your doctor what other things you can do to prevent falls. Document Released: 09/13/2009 Document Revised: 05/18/2012 Document Reviewed: 02/17/2012 [facility] Patient Information 2015 [place], [place]. This information is not intended to replace advice given to you by your health care provider. Make sure you discuss any questions you have with your health care provider.

## 2015-01-09 NOTE — ED Notes (Signed)
Pt arrived to the ED via EMS with a complaint of a fall.  Pt has a skin tear on the posterior head.  Pt also has a small laceration on his right hand.  Pt was witnesses by another resident.  No LOC.

## 2015-01-09 NOTE — ED Provider Notes (Signed)
CSN: [id number]     Arrival date & time 01/09/15  2305 History   First MD Initiated Contact with Patient 01/09/15 2323     Chief Complaint  Patient presents with  . Fall     (Consider location/radiation/quality/duration/timing/severity/associated sxs/prior Treatment) Patient is a 81 y.o. male presenting with fall.  Fall    William Ramos is a 81 y.o. male with past medical history of Alzheimer's disease, hypertension, hyperlipidemia presenting today after a fall. Patient is unable to provide his own history, per nursing notes EMS was called for a witnessed fall by another resident. Patient does have a small skin tear to his posterior head an small abrasions to his right hand. There are no other signs of injury.   Past Medical History  Diagnosis Date  . Hypertension   . Gout   . Hypercholesteremia   . Arthritis   . Acid reflux   . Cancer     Cancer-prostate  . Edema   . Psoriasis   . Alzheimer disease    Past Surgical History  Procedure Laterality Date  . Cataract extraction w/phaco  07/01/2012    Procedure: CATARACT EXTRACTION PHACO AND INTRAOCULAR LENS PLACEMENT (IOC);  Surgeon: Tonny Branch, MD;  Location: [facility];  Service: Ophthalmology;  Laterality: Right;  CDE 19.91  . Cataract extraction w/phaco  09/02/2012    Procedure: CATARACT EXTRACTION PHACO AND INTRAOCULAR LENS PLACEMENT (IOC);  Surgeon: Tonny Branch, MD;  Location: [facility];  Service: Ophthalmology;  Laterality: Left;  CDE 16.23  . Tonsillectomy    . Dilatation of esophageal stricture     Family History  Problem Relation Age of Onset  . Heart attack Mother   . Stroke Father    History  Substance Use Topics  . Smoking status: Never Smoker   . Smokeless tobacco: Never Used  . Alcohol Use: Yes     Comment: 2 glasses of wine daily    Review of Systems  Unable to perform ROS: Dementia      Allergies  Review of patient's allergies indicates no known allergies.  Home Medications   Prior to Admission  medications   Medication Sig Start Date End Date Taking? Authorizing Provider  acetaminophen (TYLENOL) 325 MG tablet Take 650 mg by mouth every 6 (six) hours as needed for moderate pain.    Historical Provider, MD  beta carotene w/minerals (OCUVITE) tablet Take 1 tablet by mouth daily with breakfast.     Historical Provider, MD  chlorproMAZINE (THORAZINE) 25 MG tablet Take 25 mg by mouth every 6 (six) hours as needed (for hiccups).    Historical Provider, MD  donepezil (ARICEPT) 10 MG tablet Take 10 mg by mouth daily with breakfast.    Historical Provider, MD  etodolac (LODINE) 400 MG tablet Take 400 mg by mouth daily with breakfast.     Historical Provider, MD  furosemide (LASIX) 20 MG tablet Take 20 mg by mouth daily with breakfast.     Historical Provider, MD  loperamide (IMODIUM) 2 MG capsule Take 2 mg by mouth every 4 (four) hours as needed for diarrhea or loose stools.    Historical Provider, MD  LORazepam (ATIVAN) 0.5 MG tablet TAKE 1 TABLET BY MOUTH EVERY EIGHT HOURS AS NEEDED FOR ANXIETY. Patient taking differently: TAKE 1 TABLET BY MOUTH EVERY SIX HOURS AS NEEDED FOR ANXIETY. 12/04/14   Larey Seat, MD  memantine (NAMENDA XR) 14 MG CP24 24 hr capsule Take 14 mg by mouth every other day.    Historical  Provider, MD  memantine (NAMENDA XR) 7 MG CP24 24 hr capsule Take 7 mg by mouth every other day.    Historical Provider, MD  omeprazole (PRILOSEC) 20 MG capsule Take 20 mg by mouth daily with breakfast.     Historical Provider, MD  probenecid (BENEMID) 500 MG tablet Take 500 mg by mouth daily with breakfast.     Historical Provider, MD  ramipril (ALTACE) 10 MG capsule Take 10 mg by mouth daily with breakfast.     Historical Provider, MD  risperiDONE (RISPERDAL) 0.25 MG tablet Take 1 tablet (0.25 mg total) by mouth 2 (two) times daily. 01/02/15   Ward Givens, NP   BP 145/88 mmHg  Pulse 89  Temp(Src) 97.9 F (36.6 C) (Oral)  Resp 18  SpO2 92% Physical Exam  Constitutional: Vital signs  are normal. He appears well-developed and well-nourished.  Non-toxic appearance. He does not appear ill. No distress.  HENT:  Head: Normocephalic.  Nose: Nose normal.  Mouth/Throat: Oropharynx is clear and moist. No oropharyngeal exudate.  Small skin tear to posterior scalp.  Eyes: Conjunctivae and EOM are normal. Pupils are equal, round, and reactive to light. No scleral icterus.  Neck: Normal range of motion. Neck supple. No tracheal deviation, no edema, no erythema and normal range of motion present. No thyroid mass and no thyromegaly present.  Cardiovascular: Normal rate, regular rhythm, S1 normal, S2 normal, normal heart sounds, intact distal pulses and normal pulses.  Exam reveals no gallop and no friction rub.   No murmur heard. Pulses:      Radial pulses are 2+ on the right side, and 2+ on the left side.       Dorsalis pedis pulses are 2+ on the right side, and 2+ on the left side.  Pulmonary/Chest: Effort normal and breath sounds normal. No respiratory distress. He has no wheezes. He has no rhonchi. He has no rales.  Abdominal: Soft. Normal appearance and bowel sounds are normal. He exhibits no distension, no ascites and no mass. There is no hepatosplenomegaly. There is no tenderness. There is no rebound, no guarding and no CVA tenderness.  Musculoskeletal: Normal range of motion. He exhibits no edema or tenderness.  Lymphadenopathy:    He has no cervical adenopathy.  Neurological: He is alert. He has normal strength. No cranial nerve deficit or sensory deficit. He exhibits normal muscle tone.  Able to follow commands, normal strength 4 extremities.  Skin: Skin is warm, dry and intact. No petechiae and no rash noted. He is not diaphoretic. No erythema. No pallor.  Small abrasions to right dorsum of the hand.  Psychiatric: He has a normal mood and affect. His behavior is normal. Judgment normal.  Nursing note and vitals reviewed.   ED Course  Procedures (including critical care  time) Labs Review Labs Reviewed  CBC WITH DIFFERENTIAL/PLATELET - Abnormal; Notable for the following:    WBC 11.0 (*)    RBC 3.27 (*)    Hemoglobin 10.5 (*)    HCT 32.5 (*)    Neutrophils Relative % 84 (*)    Neutro Abs 9.2 (*)    Lymphocytes Relative 8 (*)    All other components within normal limits  URINALYSIS, ROUTINE W REFLEX MICROSCOPIC - Abnormal; Notable for the following:    Bilirubin Urine MODERATE (*)    All other components within normal limits  URINE CULTURE  I-STAT CHEM 8, ED  I-STAT CHEM 8, ED    Imaging Review Dg Chest 2 View  01/10/2015  CLINICAL DATA:  Fall.  Multiple bruises.  Combative.  EXAM: CHEST  2 VIEW  COMPARISON:  None.  FINDINGS: Shallow inspiration. Normal heart size and pulmonary vascularity. No focal airspace disease or consolidation in the lungs. No blunting of costophrenic angles. No pneumothorax. Mediastinal contours appear intact. Degenerative changes in the spine. Anterior compression of the probable T12 and T6 vertebrae. Age is indeterminate.  IMPRESSION: No evidence of active pulmonary disease. Degenerative changes in the thoracic spine with anterior compression of 2 thoracic vertebra, probably T12 and T6.   Electronically Signed   By: Lucienne Capers M.D.   On: 01/10/2015 00:00   Ct Head Wo Contrast  01/10/2015   CLINICAL DATA:  Fall with frontal abrasions.  Dementia.  Combative.  EXAM: CT HEAD WITHOUT CONTRAST  TECHNIQUE: Contiguous axial images were obtained from the base of the skull through the vertex without intravenous contrast.  COMPARISON:  01/04/2015  FINDINGS: Examination is technically limited due to motion artifact. Prominent diffuse cerebral atrophy. Ventricular dilatation consistent with central atrophy. Low-attenuation changes in the deep white matter consistent with small vessel ischemia. Old encephalomalacia consistent with old infarcts in the posterior occipital regions, greater on the right. No mass effect or midline shift. No  abnormal extra-axial fluid collections. Gray-white matter junctions are distinct. Basal cisterns are not effaced. No evidence of acute intracranial hemorrhage. No depressed skull fractures. Visualized paranasal sinuses and mastoid air cells are not opacified.  IMPRESSION: No acute intracranial abnormalities. Chronic atrophy and small vessel ischemic changes. Old encephalomalacia in the occipital lobes.   Electronically Signed   By: Lucienne Capers M.D.   On: 01/10/2015 01:14     EKG Interpretation   Date/Time:  Wednesday January 10 2015 00:51:42 EST Ventricular Rate:  84 PR Interval:  202 QRS Duration: 94 QT Interval:  400 QTC Calculation: 473 R Axis:   21 Text Interpretation:  Sinus rhythm Low voltage, extremity leads No  significant change since last tracing Confirmed by Glynn Octave  [phone]) on 01/10/2015 2:31:05 AM      MDM   Final diagnoses:  Fall    Patient presents emergency department after a witnessed fall. Should and has frequent ED visits for the same. Will evaluate with CT scan of head to assess for injury. Also evaluate for infection as cause of fall.  Infectious workup is negative, EKG does not show any cause for syncope. CT scan of head does not show any significant injuries. His vital signs were within his normal limits the patient is safe for discharge.    Everlene Balls, MD 01/10/15 [phone]

## 2015-01-10 DIAGNOSIS — S0001XA Abrasion of scalp, initial encounter: Secondary | ICD-10-CM | POA: Diagnosis not present

## 2015-01-10 LAB — URINALYSIS, ROUTINE W REFLEX MICROSCOPIC
GLUCOSE, UA: NEGATIVE mg/dL
Hgb urine dipstick: NEGATIVE
KETONES UR: NEGATIVE mg/dL
Leukocytes, UA: NEGATIVE
NITRITE: NEGATIVE
PH: 5.5 (ref 5.0–8.0)
Protein, ur: NEGATIVE mg/dL
SPECIFIC GRAVITY, URINE: 1.023 (ref 1.005–1.030)
Urobilinogen, UA: 1 mg/dL (ref 0.0–1.0)

## 2015-01-10 LAB — CBC WITH DIFFERENTIAL/PLATELET
BASOS ABS: 0 10*3/uL (ref 0.0–0.1)
BASOS PCT: 0 % (ref 0–1)
EOS PCT: 1 % (ref 0–5)
Eosinophils Absolute: 0.1 10*3/uL (ref 0.0–0.7)
HCT: 32.5 % — ABNORMAL LOW (ref 39.0–52.0)
HEMOGLOBIN: 10.5 g/dL — AB (ref 13.0–17.0)
Lymphocytes Relative: 8 % — ABNORMAL LOW (ref 12–46)
Lymphs Abs: 0.9 10*3/uL (ref 0.7–4.0)
MCH: 32.1 pg (ref 26.0–34.0)
MCHC: 32.3 g/dL (ref 30.0–36.0)
MCV: 99.4 fL (ref 78.0–100.0)
MONO ABS: 0.8 10*3/uL (ref 0.1–1.0)
MONOS PCT: 7 % (ref 3–12)
Neutro Abs: 9.2 10*3/uL — ABNORMAL HIGH (ref 1.7–7.7)
Neutrophils Relative %: 84 % — ABNORMAL HIGH (ref 43–77)
Platelets: 265 10*3/uL (ref 150–400)
RBC: 3.27 MIL/uL — AB (ref 4.22–5.81)
RDW: 13.9 % (ref 11.5–15.5)
WBC: 11 10*3/uL — ABNORMAL HIGH (ref 4.0–10.5)

## 2015-01-10 NOTE — ED Notes (Addendum)
I-stat chem resulted but was not able to cross over, Probation officer notified EDP [employer]

## 2015-01-10 NOTE — ED Notes (Signed)
Patient very agitated, unable to obtain EKG until 0130.

## 2015-01-10 NOTE — ED Notes (Signed)
[facility] called for transport of patient to [facility]

## 2015-01-11 LAB — URINE CULTURE
CULTURE: NO GROWTH
Colony Count: NO GROWTH

## 2015-01-11 LAB — I-STAT CHEM 8, ED
BUN: 25 mg/dL — ABNORMAL HIGH (ref 6–23)
Calcium, Ion: 1.15 mmol/L (ref 1.13–1.30)
Chloride: 104 mmol/L (ref 96–112)
Creatinine, Ser: 1.1 mg/dL (ref 0.50–1.35)
Glucose, Bld: 110 mg/dL — ABNORMAL HIGH (ref 70–99)
HEMATOCRIT: 35 % — AB (ref 39.0–52.0)
HEMOGLOBIN: 11.9 g/dL — AB (ref 13.0–17.0)
Potassium: 4.2 mmol/L (ref 3.5–5.1)
Sodium: 144 mmol/L (ref 135–145)
TCO2: 24 mmol/L (ref 0–100)

## 2015-02-01 ENCOUNTER — Emergency Department (HOSPITAL_COMMUNITY)
Admission: EM | Admit: 2015-02-01 | Discharge: 2015-02-01 | Disposition: A | Discharge status: Home / Self Care | Payer: Medicare Other | Attending: Emergency Medicine | Admitting: Emergency Medicine

## 2015-02-01 ENCOUNTER — Emergency Department (HOSPITAL_COMMUNITY): Payer: Medicare Other

## 2015-02-01 DIAGNOSIS — M1990 Unspecified osteoarthritis, unspecified site: Secondary | ICD-10-CM | POA: Insufficient documentation

## 2015-02-01 DIAGNOSIS — D649 Anemia, unspecified: Secondary | ICD-10-CM | POA: Insufficient documentation

## 2015-02-01 DIAGNOSIS — S0001XA Abrasion of scalp, initial encounter: Secondary | ICD-10-CM | POA: Diagnosis not present

## 2015-02-01 DIAGNOSIS — Z79899 Other long term (current) drug therapy: Secondary | ICD-10-CM | POA: Insufficient documentation

## 2015-02-01 DIAGNOSIS — W19XXXA Unspecified fall, initial encounter: Secondary | ICD-10-CM | POA: Diagnosis not present

## 2015-02-01 DIAGNOSIS — I10 Essential (primary) hypertension: Secondary | ICD-10-CM | POA: Diagnosis not present

## 2015-02-01 DIAGNOSIS — Z872 Personal history of diseases of the skin and subcutaneous tissue: Secondary | ICD-10-CM | POA: Insufficient documentation

## 2015-02-01 DIAGNOSIS — S50311A Abrasion of right elbow, initial encounter: Secondary | ICD-10-CM | POA: Insufficient documentation

## 2015-02-01 DIAGNOSIS — F0280 Dementia in other diseases classified elsewhere without behavioral disturbance: Secondary | ICD-10-CM | POA: Insufficient documentation

## 2015-02-01 DIAGNOSIS — Y92128 Other place in nursing home as the place of occurrence of the external cause: Secondary | ICD-10-CM | POA: Diagnosis not present

## 2015-02-01 DIAGNOSIS — S0990XA Unspecified injury of head, initial encounter: Secondary | ICD-10-CM | POA: Diagnosis present

## 2015-02-01 DIAGNOSIS — Y999 Unspecified external cause status: Secondary | ICD-10-CM | POA: Diagnosis not present

## 2015-02-01 DIAGNOSIS — G309 Alzheimer's disease, unspecified: Secondary | ICD-10-CM | POA: Insufficient documentation

## 2015-02-01 DIAGNOSIS — Y939 Activity, unspecified: Secondary | ICD-10-CM | POA: Insufficient documentation

## 2015-02-01 DIAGNOSIS — Z8546 Personal history of malignant neoplasm of prostate: Secondary | ICD-10-CM | POA: Insufficient documentation

## 2015-02-01 DIAGNOSIS — K219 Gastro-esophageal reflux disease without esophagitis: Secondary | ICD-10-CM | POA: Insufficient documentation

## 2015-02-01 DIAGNOSIS — T1490XA Injury, unspecified, initial encounter: Secondary | ICD-10-CM

## 2015-02-01 LAB — I-STAT CHEM 8, ED
BUN: 20 mg/dL (ref 6–23)
Calcium, Ion: 1.16 mmol/L (ref 1.13–1.30)
Chloride: 103 mmol/L (ref 96–112)
Creatinine, Ser: 1 mg/dL (ref 0.50–1.35)
GLUCOSE: 108 mg/dL — AB (ref 70–99)
HEMATOCRIT: 28 % — AB (ref 39.0–52.0)
Hemoglobin: 9.5 g/dL — ABNORMAL LOW (ref 13.0–17.0)
POTASSIUM: 3.3 mmol/L — AB (ref 3.5–5.1)
Sodium: 140 mmol/L (ref 135–145)
TCO2: 23 mmol/L (ref 0–100)

## 2015-02-01 NOTE — ED Provider Notes (Signed)
CSN: [id number]     Arrival date & time 02/01/15  0707 History   First MD Initiated Contact with Patient 02/01/15 [phone]     Chief Complaint  Patient presents with  . Fall     (Consider location/radiation/quality/duration/timing/severity/associated sxs/prior Treatment) HPI Comments: Patient here after unwitnessed fall at the nursing home. Has a history of dementia and was found by his bed. He was noted to have an abrasion to his occipital area of the scalp. Also had a left elbow abrasion as well 2. Mental status was at baseline. Patient transported by EMS.  Patient is a 81 y.o. male presenting with fall. The history is provided by medical records. The history is limited by the condition of the patient.  Fall    Past Medical History  Diagnosis Date  . Hypertension   . Gout   . Hypercholesteremia   . Arthritis   . Acid reflux   . Cancer     Cancer-prostate  . Edema   . Psoriasis   . Alzheimer disease    Past Surgical History  Procedure Laterality Date  . Cataract extraction w/phaco  07/01/2012    Procedure: CATARACT EXTRACTION PHACO AND INTRAOCULAR LENS PLACEMENT (IOC);  Surgeon: Tonny Branch, MD;  Location: [facility];  Service: Ophthalmology;  Laterality: Right;  CDE 19.91  . Cataract extraction w/phaco  09/02/2012    Procedure: CATARACT EXTRACTION PHACO AND INTRAOCULAR LENS PLACEMENT (IOC);  Surgeon: Tonny Branch, MD;  Location: [facility];  Service: Ophthalmology;  Laterality: Left;  CDE 16.23  . Tonsillectomy    . Dilatation of esophageal stricture     Family History  Problem Relation Age of Onset  . Heart attack Mother   . Stroke Father    History  Substance Use Topics  . Smoking status: Never Smoker   . Smokeless tobacco: Never Used  . Alcohol Use: Yes     Comment: 2 glasses of wine daily    Review of Systems  Unable to perform ROS     Allergies  Review of patient's allergies indicates no known allergies.  Home Medications   Prior to Admission medications     Medication Sig Start Date End Date Taking? Authorizing Provider  acetaminophen (TYLENOL) 325 MG tablet Take 650 mg by mouth every 6 (six) hours as needed for moderate pain.    Historical Provider, MD  beta carotene w/minerals (OCUVITE) tablet Take 1 tablet by mouth daily with breakfast.     Historical Provider, MD  chlorproMAZINE (THORAZINE) 25 MG tablet Take 25 mg by mouth every 6 (six) hours as needed (for hiccups).    Historical Provider, MD  donepezil (ARICEPT) 10 MG tablet Take 10 mg by mouth daily with breakfast.    Historical Provider, MD  etodolac (LODINE) 400 MG tablet Take 400 mg by mouth daily with breakfast.     Historical Provider, MD  furosemide (LASIX) 20 MG tablet Take 20 mg by mouth daily with breakfast.     Historical Provider, MD  loperamide (IMODIUM) 2 MG capsule Take 2 mg by mouth every 4 (four) hours as needed for diarrhea or loose stools.    Historical Provider, MD  LORazepam (ATIVAN) 0.5 MG tablet TAKE 1 TABLET BY MOUTH EVERY EIGHT HOURS AS NEEDED FOR ANXIETY. Patient taking differently: TAKE 1 TABLET BY MOUTH EVERY SIX HOURS AS NEEDED FOR ANXIETY. 12/04/14   Larey Seat, MD  memantine (NAMENDA XR) 14 MG CP24 24 hr capsule Take 14 mg by mouth every other day.  Historical Provider, MD  memantine (NAMENDA XR) 7 MG CP24 24 hr capsule Take 7 mg by mouth every other day.    Historical Provider, MD  omeprazole (PRILOSEC) 20 MG capsule Take 20 mg by mouth daily with breakfast.     Historical Provider, MD  probenecid (BENEMID) 500 MG tablet Take 500 mg by mouth daily with breakfast.     Historical Provider, MD  ramipril (ALTACE) 10 MG capsule Take 10 mg by mouth daily with breakfast.     Historical Provider, MD  risperiDONE (RISPERDAL) 0.25 MG tablet Take 1 tablet (0.25 mg total) by mouth 2 (two) times daily. 01/02/15   Ward Givens, NP   BP 105/56 mmHg  Pulse 58  Temp(Src) 97.9 F (36.6 C) (Oral)  Resp 18  SpO2 99% Physical Exam  Constitutional: He appears  well-developed and well-nourished.  Non-toxic appearance. No distress.  HENT:  Head: Normocephalic and atraumatic.    Eyes: Conjunctivae, EOM and lids are normal. Pupils are equal, round, and reactive to light.  Neck: Normal range of motion. Neck supple. No tracheal deviation present. No thyroid mass present.  Cardiovascular: Normal rate, regular rhythm and normal heart sounds.  Exam reveals no gallop.   No murmur heard. Pulmonary/Chest: Effort normal and breath sounds normal. No stridor. No respiratory distress. He has no decreased breath sounds. He has no wheezes. He has no rhonchi. He has no rales.  Abdominal: Soft. Normal appearance and bowel sounds are normal. He exhibits no distension. There is no tenderness. There is no rebound and no CVA tenderness.  Musculoskeletal: Normal range of motion. He exhibits no edema or tenderness.       Arms: No shortening or rotation of patient's lower extremities. Full range of motion.  Neurological: He is alert. He has normal strength. No cranial nerve deficit or sensory deficit. GCS eye subscore is 4. GCS verbal subscore is 4. GCS motor subscore is 6.  Skin: Skin is warm and dry. No abrasion and no rash noted.  Psychiatric: He has a normal mood and affect. His speech is normal and behavior is normal.  Nursing note and vitals reviewed.   ED Course  Procedures (including critical care time) Labs Review Labs Reviewed  I-STAT CHEM 8, ED    Imaging Review No results found.   EKG Interpretation None      MDM   Final diagnoses:  Trauma    Patient's hemoglobin noted at 9.5. Will be instructed to follow-up with this. No evidence of GI bleeding at this time.    Leota Jacobsen, MD 02/01/15 [phone]

## 2015-02-01 NOTE — ED Notes (Signed)
Wound care provided to left elbow and dressing applied. Pt given crackers and juice.

## 2015-02-01 NOTE — ED Notes (Signed)
Pt had unwitnessed fall at [facility]. No LOC. Pt has severe dementia at baseline. Alert and oriented to self only. Pt has a small knot to the back of the head with small amount of dried bloody drainage and skin tear to left elbow area.

## 2015-02-01 NOTE — ED Notes (Signed)
Bed: WA17 Expected date:  Expected time:  Means of arrival:  Comments: EMS 81 yo male fall-knot back of head

## 2015-02-01 NOTE — Discharge Instructions (Signed)
This patient's hemoglobin is 9.5. Please call his physician to schedule further workup Abrasion An abrasion is a cut or scrape of the skin. Abrasions do not extend through all layers of the skin and most heal within 10 days. It is important to care for your abrasion properly to prevent infection. CAUSES  Most abrasions are caused by falling on, or gliding across, the ground or other surface. When your skin rubs on something, the outer and inner layer of skin rubs off, causing an abrasion. DIAGNOSIS  Your caregiver will be able to diagnose an abrasion during a physical exam.  TREATMENT  Your treatment depends on how large and deep the abrasion is. Generally, your abrasion will be cleaned with water and a mild soap to remove any dirt or debris. An antibiotic ointment may be put over the abrasion to prevent an infection. A bandage (dressing) may be wrapped around the abrasion to keep it from getting dirty.  You may need a tetanus shot if:  You cannot remember when you had your last tetanus shot.  You have never had a tetanus shot.  The injury broke your skin. If you get a tetanus shot, your arm may swell, get red, and feel warm to the touch. This is common and not a problem. If you need a tetanus shot and you choose not to have one, there is a rare chance of getting tetanus. Sickness from tetanus can be serious.  HOME CARE INSTRUCTIONS   If a dressing was applied, change it at least once a day or as directed by your caregiver. If the bandage sticks, soak it off with warm water.   Wash the area with water and a mild soap to remove all the ointment 2 times a day. Rinse off the soap and pat the area dry with a clean towel.   Reapply any ointment as directed by your caregiver. This will help prevent infection and keep the bandage from sticking. Use gauze over the wound and under the dressing to help keep the bandage from sticking.   Change your dressing right away if it becomes wet or dirty.    Only take over-the-counter or prescription medicines for pain, discomfort, or fever as directed by your caregiver.   Follow up with your caregiver within 24-48 hours for a wound check, or as directed. If you were not given a wound-check appointment, look closely at your abrasion for redness, swelling, or pus. These are signs of infection. SEEK IMMEDIATE MEDICAL CARE IF:   You have increasing pain in the wound.   You have redness, swelling, or tenderness around the wound.   You have pus coming from the wound.   You have a fever or persistent symptoms for more than 2-3 days.  You have a fever and your symptoms suddenly get worse.  You have a bad smell coming from the wound or dressing.  MAKE SURE YOU:   Understand these instructions.  Will watch your condition.  Will get help right away if you are not doing well or get worse. Document Released: 08/27/2005 Document Revised: 11/03/2012 Document Reviewed: 10/21/2011 [facility] Patient Information 2015 [place], [place]. This information is not intended to replace advice given to you by your health care provider. Make sure you discuss any questions you have with your health care provider.  Anemia, Nonspecific Anemia is a condition in which the concentration of red blood cells or hemoglobin in the blood is below normal. Hemoglobin is a substance in red blood cells that carries  oxygen to the tissues of the body. Anemia results in not enough oxygen reaching these tissues.  CAUSES  Common causes of anemia include:   Excessive bleeding. Bleeding may be internal or external. This includes excessive bleeding from periods (in women) or from the intestine.   Poor nutrition.   Chronic kidney, thyroid, and liver disease.  Bone marrow disorders that decrease red blood cell production.  Cancer and treatments for cancer.  HIV, AIDS, and their treatments.  Spleen problems that increase red blood cell destruction.  Blood  disorders.  Excess destruction of red blood cells due to infection, medicines, and autoimmune disorders. SIGNS AND SYMPTOMS   Minor weakness.   Dizziness.   Headache.  Palpitations.   Shortness of breath, especially with exercise.   Paleness.  Cold sensitivity.  Indigestion.  Nausea.  Difficulty sleeping.  Difficulty concentrating. Symptoms may occur suddenly or they may develop slowly.  DIAGNOSIS  Additional blood tests are often needed. These help your health care provider determine the best treatment. Your health care provider will check your stool for blood and look for other causes of blood loss.  TREATMENT  Treatment varies depending on the cause of the anemia. Treatment can include:   Supplements of iron, vitamin H29, or folic acid.   Hormone medicines.   A blood transfusion. This may be needed if blood loss is severe.   Hospitalization. This may be needed if there is significant continual blood loss.   Dietary changes.  Spleen removal. HOME CARE INSTRUCTIONS Keep all follow-up appointments. It often takes many weeks to correct anemia, and having your health care provider check on your condition and your response to treatment is very important. SEEK IMMEDIATE MEDICAL CARE IF:   You develop extreme weakness, shortness of breath, or chest pain.   You become dizzy or have trouble concentrating.  You develop heavy vaginal bleeding.   You develop a rash.   You have bloody or black, tarry stools.   You faint.   You vomit up blood.   You vomit repeatedly.   You have abdominal pain.  You have a fever or persistent symptoms for more than 2-3 days.   You have a fever and your symptoms suddenly get worse.   You are dehydrated.  MAKE SURE YOU:  Understand these instructions.  Will watch your condition.  Will get help right away if you are not doing well or get worse. Document Released: 12/25/2004 Document Revised: 07/20/2013  Document Reviewed: 05/13/2013 [facility] Patient Information 2015 [place], [place]. This information is not intended to replace advice given to you by your health care provider. Make sure you discuss any questions you have with your health care provider.

## 2015-02-14 ENCOUNTER — Emergency Department (HOSPITAL_COMMUNITY)
Admission: EM | Admit: 2015-02-14 | Discharge: 2015-02-14 | Disposition: A | Discharge status: Home / Self Care | Payer: Medicare Other | Attending: Emergency Medicine | Admitting: Emergency Medicine

## 2015-02-14 ENCOUNTER — Encounter (HOSPITAL_COMMUNITY): Payer: Self-pay

## 2015-02-14 DIAGNOSIS — Y92128 Other place in nursing home as the place of occurrence of the external cause: Secondary | ICD-10-CM | POA: Diagnosis not present

## 2015-02-14 DIAGNOSIS — Y998 Other external cause status: Secondary | ICD-10-CM | POA: Insufficient documentation

## 2015-02-14 DIAGNOSIS — R296 Repeated falls: Secondary | ICD-10-CM | POA: Diagnosis not present

## 2015-02-14 DIAGNOSIS — W010XXA Fall on same level from slipping, tripping and stumbling without subsequent striking against object, initial encounter: Secondary | ICD-10-CM | POA: Insufficient documentation

## 2015-02-14 DIAGNOSIS — F0280 Dementia in other diseases classified elsewhere without behavioral disturbance: Secondary | ICD-10-CM | POA: Diagnosis not present

## 2015-02-14 DIAGNOSIS — S0101XA Laceration without foreign body of scalp, initial encounter: Secondary | ICD-10-CM | POA: Insufficient documentation

## 2015-02-14 DIAGNOSIS — Z791 Long term (current) use of non-steroidal anti-inflammatories (NSAID): Secondary | ICD-10-CM | POA: Insufficient documentation

## 2015-02-14 DIAGNOSIS — M1990 Unspecified osteoarthritis, unspecified site: Secondary | ICD-10-CM | POA: Diagnosis not present

## 2015-02-14 DIAGNOSIS — Y9301 Activity, walking, marching and hiking: Secondary | ICD-10-CM | POA: Diagnosis not present

## 2015-02-14 DIAGNOSIS — Z79899 Other long term (current) drug therapy: Secondary | ICD-10-CM | POA: Diagnosis not present

## 2015-02-14 DIAGNOSIS — Z8546 Personal history of malignant neoplasm of prostate: Secondary | ICD-10-CM | POA: Insufficient documentation

## 2015-02-14 DIAGNOSIS — Z8639 Personal history of other endocrine, nutritional and metabolic disease: Secondary | ICD-10-CM | POA: Diagnosis not present

## 2015-02-14 DIAGNOSIS — I10 Essential (primary) hypertension: Secondary | ICD-10-CM | POA: Insufficient documentation

## 2015-02-14 DIAGNOSIS — M109 Gout, unspecified: Secondary | ICD-10-CM | POA: Diagnosis not present

## 2015-02-14 DIAGNOSIS — G309 Alzheimer's disease, unspecified: Secondary | ICD-10-CM | POA: Insufficient documentation

## 2015-02-14 DIAGNOSIS — K219 Gastro-esophageal reflux disease without esophagitis: Secondary | ICD-10-CM | POA: Diagnosis not present

## 2015-02-14 DIAGNOSIS — S0990XA Unspecified injury of head, initial encounter: Secondary | ICD-10-CM | POA: Diagnosis present

## 2015-02-14 DIAGNOSIS — Z872 Personal history of diseases of the skin and subcutaneous tissue: Secondary | ICD-10-CM | POA: Insufficient documentation

## 2015-02-14 MED ORDER — LORAZEPAM 2 MG/ML IJ SOLN
1.0000 mg | Freq: Once | INTRAMUSCULAR | Status: AC
  Administered 2015-02-14: 1 mg via INTRAVENOUS
  Filled 2015-02-14: qty 1

## 2015-02-14 NOTE — ED Notes (Signed)
Dr. Darl Householder has just stapled his scalp lac. He is quite loud and agitated.

## 2015-02-14 NOTE — ED Notes (Signed)
He tripped and fell at his nursing home([facility] on [address]  He arrives in no distress.

## 2015-02-14 NOTE — Discharge Instructions (Signed)
Continue current meds.   Fall precautions.   Staple removal in 5 days.   See your doctor.   Return to ER if you have another fall, not behaving normally, vomiting.

## 2015-02-14 NOTE — ED Notes (Signed)
Staff at [facility] reported to EMS that they saw pt. "trip over equipment in a hallway while he was walking"; and he did not have l.o.c.

## 2015-02-14 NOTE — ED Provider Notes (Signed)
CSN: [id number]     Arrival date & time 02/14/15  0910 History   First MD Initiated Contact with Patient 02/14/15 0912     Chief Complaint  Patient presents with  . Head Laceration     (Consider location/radiation/quality/duration/timing/severity/associated sxs/prior Treatment) The history is provided by the patient and the EMS personnel.  LORANZO DESHA is a 81 y.o. male hx of dementia, hypertension, high cholesterol, recurrent falls from a nursing home here presenting with fall. He was witnessed by staff there that he was walking and tripped over something and fell and had a laceration of the right scalp. They did not note any other injuries. Patient demented at baseline and can tell me how he fell. He has frequent falls and most recently was about 10 days ago and had CT head that was unremarkable. He is not currently on blood thinners.   Past Medical History  Diagnosis Date  . Hypertension   . Gout   . Hypercholesteremia   . Arthritis   . Acid reflux   . Cancer     Cancer-prostate  . Edema   . Psoriasis   . Alzheimer disease    Past Surgical History  Procedure Laterality Date  . Cataract extraction w/phaco  07/01/2012    Procedure: CATARACT EXTRACTION PHACO AND INTRAOCULAR LENS PLACEMENT (IOC);  Surgeon: Tonny Branch, MD;  Location: [facility];  Service: Ophthalmology;  Laterality: Right;  CDE 19.91  . Cataract extraction w/phaco  09/02/2012    Procedure: CATARACT EXTRACTION PHACO AND INTRAOCULAR LENS PLACEMENT (IOC);  Surgeon: Tonny Branch, MD;  Location: [facility];  Service: Ophthalmology;  Laterality: Left;  CDE 16.23  . Tonsillectomy    . Dilatation of esophageal stricture     Family History  Problem Relation Age of Onset  . Heart attack Mother   . Stroke Father    History  Substance Use Topics  . Smoking status: Never Smoker   . Smokeless tobacco: Never Used  . Alcohol Use: Yes     Comment: 2 glasses of wine daily    Review of Systems  Unable to perform ROS: Dementia       Allergies  Review of patient's allergies indicates no known allergies.  Home Medications   Prior to Admission medications   Medication Sig Start Date End Date Taking? Authorizing Provider  acetaminophen (TYLENOL) 325 MG tablet Take 650 mg by mouth every 6 (six) hours as needed for moderate pain.   Yes Historical Provider, MD  chlorproMAZINE (THORAZINE) 25 MG tablet Take 25 mg by mouth every 6 (six) hours as needed (for hiccups).   Yes Historical Provider, MD  donepezil (ARICEPT) 10 MG tablet Take 10 mg by mouth daily with breakfast.   Yes Historical Provider, MD  ENSURE (ENSURE) Take 237 mLs by mouth daily with breakfast.   Yes Historical Provider, MD  etodolac (LODINE) 400 MG tablet Take 400 mg by mouth daily with breakfast.    Yes Historical Provider, MD  furosemide (LASIX) 20 MG tablet Take 20 mg by mouth daily with breakfast.    Yes Historical Provider, MD  loperamide (IMODIUM) 2 MG capsule Take 2 mg by mouth every 4 (four) hours as needed for diarrhea or loose stools.   Yes Historical Provider, MD  LORazepam (ATIVAN) 0.5 MG tablet TAKE 1 TABLET BY MOUTH EVERY EIGHT HOURS AS NEEDED FOR ANXIETY. Patient taking differently: TAKE 1 TABLET BY MOUTH EVERY SIX HOURS AS NEEDED FOR ANXIETY. 12/04/14  Yes Larey Seat, MD  LORazepam (ATIVAN)  0.5 MG tablet Take 0.5 mg by mouth daily. @@ 4 PM   Yes Historical Provider, MD  Multiple Vitamins-Minerals (ELDERTONIC PO) Take 15 mLs by mouth 3 (three) times daily.   Yes Historical Provider, MD  Multiple Vitamins-Minerals (I-VITE) TABS Take 1 tablet by mouth daily with breakfast.   Yes Historical Provider, MD  Nutritional Supplements (NUTRITIONAL DRINK SHAKE MIX PO) Take 1 Can by mouth 3 (three) times daily.   Yes Historical Provider, MD  omeprazole (PRILOSEC) 20 MG capsule Take 20 mg by mouth daily with breakfast.    Yes Historical Provider, MD  probenecid (BENEMID) 500 MG tablet Take 500 mg by mouth daily with breakfast.    Yes Historical  Provider, MD  ramipril (ALTACE) 10 MG capsule Take 10 mg by mouth daily with breakfast.    Yes Historical Provider, MD  risperiDONE (RISPERDAL) 0.25 MG tablet Take 1 tablet (0.25 mg total) by mouth 2 (two) times daily. 01/02/15  Yes Megan P Millikan, NP   BP 108/63 mmHg  Pulse 75  Resp 18  SpO2 97% Physical Exam  Constitutional:  Demented,   HENT:  Head: Normocephalic.  R posterior scalp laceration 2 cm long, uncomplicated. No obvious scalp hematoma   Eyes: Conjunctivae are normal. Pupils are equal, round, and reactive to light.  Neck: Normal range of motion. Neck supple.  Cardiovascular: Normal rate, regular rhythm and normal heart sounds.   Pulmonary/Chest: Effort normal and breath sounds normal. No respiratory distress. He has no wheezes. He exhibits no tenderness.  Abdominal: Soft. Bowel sounds are normal. He exhibits no distension. There is no tenderness.  Musculoskeletal:  No obvious extremity trauma. Nl ROM bilateral hips. No spinal stepoff   Neurological: He is alert.  Demented, moving all extremities. Agitated (baseline)   Skin: Skin is warm and dry.  Psychiatric:  Unable   Nursing note and vitals reviewed.   ED Course  Procedures (including critical care time)  LACERATION REPAIR Performed by: Shirlyn Goltz Authorized by: Shirlyn Goltz Consent: Verbal consent obtained. Risks and benefits: risks, benefits and alternatives were discussed Consent given by: patient Patient identity confirmed: provided demographic data Prepped and Draped in normal sterile fashion Wound explored  Laceration Location: R scalp  Laceration Length: 2 cm  No Foreign Bodies seen or palpated  Anesthesia: none  Irrigation method: syringe Amount of cleaning: standard  Skin closure: staples  Number of staples: 4  Patient tolerance: Patient tolerated the procedure well with no immediate complications.   Labs Review Labs Reviewed - No data to display  Imaging Review No results  found.   EKG Interpretation None      MDM   Final diagnoses:  None    MAALIK PINN is a 81 y.o. male here with mechanical fall. Just had CT a week ago. Not on blood thinners and mental status at baseline and has nonfocal neuro exam. I stapled laceration. Will not repeat CT. Given ativan for agitation.     Wandra Arthurs, MD 02/14/15 1028

## 2015-02-14 NOTE — ED Notes (Signed)
IV taken out, and its dry,clean, intact.

## 2015-02-14 NOTE — ED Notes (Signed)
Bed: WA07 Expected date:  Expected time:  Means of arrival:  Comments: 47 M  Fall Lac to head

## 2015-03-04 ENCOUNTER — Emergency Department (HOSPITAL_COMMUNITY)
Admission: EM | Admit: 2015-03-04 | Discharge: 2015-03-04 | Disposition: A | Discharge status: Home / Self Care | Payer: Medicare Other | Attending: Emergency Medicine | Admitting: Emergency Medicine

## 2015-03-04 ENCOUNTER — Encounter (HOSPITAL_COMMUNITY): Payer: Self-pay | Admitting: *Deleted

## 2015-03-04 DIAGNOSIS — G309 Alzheimer's disease, unspecified: Secondary | ICD-10-CM | POA: Diagnosis not present

## 2015-03-04 DIAGNOSIS — Z8639 Personal history of other endocrine, nutritional and metabolic disease: Secondary | ICD-10-CM | POA: Diagnosis not present

## 2015-03-04 DIAGNOSIS — M109 Gout, unspecified: Secondary | ICD-10-CM | POA: Diagnosis not present

## 2015-03-04 DIAGNOSIS — S0992XA Unspecified injury of nose, initial encounter: Secondary | ICD-10-CM | POA: Insufficient documentation

## 2015-03-04 DIAGNOSIS — Y939 Activity, unspecified: Secondary | ICD-10-CM | POA: Insufficient documentation

## 2015-03-04 DIAGNOSIS — Z79899 Other long term (current) drug therapy: Secondary | ICD-10-CM | POA: Diagnosis not present

## 2015-03-04 DIAGNOSIS — Y929 Unspecified place or not applicable: Secondary | ICD-10-CM | POA: Insufficient documentation

## 2015-03-04 DIAGNOSIS — W19XXXA Unspecified fall, initial encounter: Secondary | ICD-10-CM | POA: Diagnosis not present

## 2015-03-04 DIAGNOSIS — Y92129 Unspecified place in nursing home as the place of occurrence of the external cause: Secondary | ICD-10-CM

## 2015-03-04 DIAGNOSIS — S0031XA Abrasion of nose, initial encounter: Secondary | ICD-10-CM | POA: Diagnosis not present

## 2015-03-04 DIAGNOSIS — F0280 Dementia in other diseases classified elsewhere without behavioral disturbance: Secondary | ICD-10-CM | POA: Diagnosis not present

## 2015-03-04 DIAGNOSIS — Z872 Personal history of diseases of the skin and subcutaneous tissue: Secondary | ICD-10-CM | POA: Insufficient documentation

## 2015-03-04 DIAGNOSIS — K219 Gastro-esophageal reflux disease without esophagitis: Secondary | ICD-10-CM | POA: Insufficient documentation

## 2015-03-04 DIAGNOSIS — Y998 Other external cause status: Secondary | ICD-10-CM | POA: Diagnosis not present

## 2015-03-04 DIAGNOSIS — Z8546 Personal history of malignant neoplasm of prostate: Secondary | ICD-10-CM | POA: Diagnosis not present

## 2015-03-04 DIAGNOSIS — M1990 Unspecified osteoarthritis, unspecified site: Secondary | ICD-10-CM | POA: Diagnosis not present

## 2015-03-04 NOTE — ED Notes (Signed)
[facility] present in ED to take patient home Patient in NAD upon time of DC home

## 2015-03-04 NOTE — ED Notes (Signed)
Patient from [facility] Patient brought in by EMS for c/o fall Patient in C-collar upon arrival Patient with hx of Alzheimer's, frequent falls and getting up by himself and "wandering around" Patient wears glasses--small abrasion noted to bridge of nose

## 2015-03-04 NOTE — ED Notes (Signed)
[facility] called and made aware of need to transport patient back to nursing home

## 2015-03-04 NOTE — ED Notes (Signed)
MD at bedside. 

## 2015-03-04 NOTE — ED Notes (Signed)
Patient resting in position of comfort with eyes closed RR WNL--even and unlabored with equal rise and fall of chest Patient in NAD Side rails up, call bell in reach  

## 2015-03-04 NOTE — ED Provider Notes (Signed)
CSN: [id number]     Arrival date & time 03/04/15  0508 History   First MD Initiated Contact with Patient 03/04/15 [phone]     Chief Complaint  Patient presents with  . Fall     (Consider location/radiation/quality/duration/timing/severity/associated sxs/prior Treatment) HPI  Level 5 Caveat: dementia. This is an 81 year old male with dementia and a history of frequent falls at his nursing home. He was found this morning just prior to arrival on the floor having apparently fallen. He was wearing his glasses and it appears that when he fell the frame of his glasses was pushed into the bridge of his nose. He has an abrasion, swelling and tenderness to the bridge of his nose. He is awake and alert to his baseline. He is complaining of no pain other than his nose. He was placed in a c-collar prior to arrival and this is causing him to be agitated.  Past Medical History  Diagnosis Date  . Hypertension   . Gout   . Hypercholesteremia   . Arthritis   . Acid reflux   . Cancer     Cancer-prostate  . Edema   . Psoriasis   . Alzheimer disease    Past Surgical History  Procedure Laterality Date  . Cataract extraction w/phaco  07/01/2012    Procedure: CATARACT EXTRACTION PHACO AND INTRAOCULAR LENS PLACEMENT (IOC);  Surgeon: Tonny Branch, MD;  Location: [facility];  Service: Ophthalmology;  Laterality: Right;  CDE 19.91  . Cataract extraction w/phaco  09/02/2012    Procedure: CATARACT EXTRACTION PHACO AND INTRAOCULAR LENS PLACEMENT (IOC);  Surgeon: Tonny Branch, MD;  Location: [facility];  Service: Ophthalmology;  Laterality: Left;  CDE 16.23  . Tonsillectomy    . Dilatation of esophageal stricture     Family History  Problem Relation Age of Onset  . Heart attack Mother   . Stroke Father    History  Substance Use Topics  . Smoking status: Never Smoker   . Smokeless tobacco: Never Used  . Alcohol Use: Yes     Comment: 2 glasses of wine daily    Review of Systems  Unable to perform ROS   Allergies   Review of patient's allergies indicates no known allergies.  Home Medications   Prior to Admission medications   Medication Sig Start Date End Date Taking? Authorizing Provider  acetaminophen (TYLENOL) 325 MG tablet Take 650 mg by mouth every 6 (six) hours as needed for moderate pain.    Historical Provider, MD  chlorproMAZINE (THORAZINE) 25 MG tablet Take 25 mg by mouth every 6 (six) hours as needed (for hiccups).    Historical Provider, MD  donepezil (ARICEPT) 10 MG tablet Take 10 mg by mouth daily with breakfast.    Historical Provider, MD  ENSURE (ENSURE) Take 237 mLs by mouth daily with breakfast.    Historical Provider, MD  etodolac (LODINE) 400 MG tablet Take 400 mg by mouth daily with breakfast.     Historical Provider, MD  furosemide (LASIX) 20 MG tablet Take 20 mg by mouth daily with breakfast.     Historical Provider, MD  loperamide (IMODIUM) 2 MG capsule Take 2 mg by mouth every 4 (four) hours as needed for diarrhea or loose stools.    Historical Provider, MD  LORazepam (ATIVAN) 0.5 MG tablet TAKE 1 TABLET BY MOUTH EVERY EIGHT HOURS AS NEEDED FOR ANXIETY. Patient taking differently: TAKE 1 TABLET BY MOUTH EVERY SIX HOURS AS NEEDED FOR ANXIETY. 12/04/14   Larey Seat, MD  LORazepam (  ATIVAN) 0.5 MG tablet Take 0.5 mg by mouth daily. @@ 4 PM    Historical Provider, MD  Multiple Vitamins-Minerals (ELDERTONIC PO) Take 15 mLs by mouth 3 (three) times daily.    Historical Provider, MD  Multiple Vitamins-Minerals (I-VITE) TABS Take 1 tablet by mouth daily with breakfast.    Historical Provider, MD  Nutritional Supplements (NUTRITIONAL DRINK SHAKE MIX PO) Take 1 Can by mouth 3 (three) times daily.    Historical Provider, MD  omeprazole (PRILOSEC) 20 MG capsule Take 20 mg by mouth daily with breakfast.     Historical Provider, MD  probenecid (BENEMID) 500 MG tablet Take 500 mg by mouth daily with breakfast.     Historical Provider, MD  ramipril (ALTACE) 10 MG capsule Take 10 mg by mouth  daily with breakfast.     Historical Provider, MD  risperiDONE (RISPERDAL) 0.25 MG tablet Take 1 tablet (0.25 mg total) by mouth 2 (two) times daily. 01/02/15   Megan P Millikan, NP   BP 124/56 mmHg  Pulse 54  Temp(Src) 97.3 F (36.3 C) (Oral)  Resp 19  SpO2 100%   Physical Exam General: Well-developed, well-nourished male in no acute distress; appearance consistent with age of record HENT: normocephalic; tenderness, swelling and abrasion to bridge of nose; no epistaxis; no hemotympanum Eyes: pupils equal, round and reactive to light; extraocular muscles intact Neck: supple; nontender; no pain on range of motion; range of motion limited due to chronic-appearing arthritic changes Heart: regular rate and rhythm Lungs: clear to auscultation bilaterally Abdomen: soft; nondistended; nontender Back: No spinal tenderness Extremities: No acute deformity; arthritic changes Neurologic: Awake, alert and oriented to self; motor function intact in all extremities and symmetric; no facial droop; shuffling gait Skin: Warm and dry Psychiatric: Normal mood and affect once c-collar was removed    ED Course  Procedures (including critical care time)   MDM  The patient is at his baseline neurologically. He is not on any anticoagulants. Ears no evidence of head trauma other than to the bridge of his nose. I do not believe that imaging is indicated at this time.   Final diagnoses:  Nasal injury, initial encounter  Fall at nursing home, initial encounter     Shanon Rosser, MD 03/04/15 [phone]

## 2015-03-04 NOTE — ED Notes (Addendum)
Patient oriented to self only  Small abrasion noted to bridge of nose with scant amount of bleeding noted When asked if he fell, patient states "No."

## 2015-03-04 NOTE — ED Notes (Signed)
Patient ambulated well with assistance.  

## 2015-03-14 ENCOUNTER — Emergency Department (HOSPITAL_COMMUNITY): Payer: Medicare Other

## 2015-03-14 ENCOUNTER — Emergency Department (HOSPITAL_COMMUNITY)
Admission: EM | Admit: 2015-03-14 | Discharge: 2015-03-15 | Disposition: A | Discharge status: Home / Self Care | Payer: Medicare Other | Attending: Emergency Medicine | Admitting: Emergency Medicine

## 2015-03-14 ENCOUNTER — Encounter (HOSPITAL_COMMUNITY): Payer: Self-pay

## 2015-03-14 DIAGNOSIS — S12490A Other displaced fracture of fifth cervical vertebra, initial encounter for closed fracture: Secondary | ICD-10-CM | POA: Insufficient documentation

## 2015-03-14 DIAGNOSIS — G309 Alzheimer's disease, unspecified: Secondary | ICD-10-CM | POA: Diagnosis not present

## 2015-03-14 DIAGNOSIS — Z8546 Personal history of malignant neoplasm of prostate: Secondary | ICD-10-CM | POA: Insufficient documentation

## 2015-03-14 DIAGNOSIS — Y9389 Activity, other specified: Secondary | ICD-10-CM | POA: Insufficient documentation

## 2015-03-14 DIAGNOSIS — W1830XA Fall on same level, unspecified, initial encounter: Secondary | ICD-10-CM | POA: Insufficient documentation

## 2015-03-14 DIAGNOSIS — Z79899 Other long term (current) drug therapy: Secondary | ICD-10-CM | POA: Insufficient documentation

## 2015-03-14 DIAGNOSIS — S0081XA Abrasion of other part of head, initial encounter: Secondary | ICD-10-CM | POA: Diagnosis not present

## 2015-03-14 DIAGNOSIS — Z791 Long term (current) use of non-steroidal anti-inflammatories (NSAID): Secondary | ICD-10-CM | POA: Diagnosis not present

## 2015-03-14 DIAGNOSIS — M1990 Unspecified osteoarthritis, unspecified site: Secondary | ICD-10-CM | POA: Insufficient documentation

## 2015-03-14 DIAGNOSIS — Z872 Personal history of diseases of the skin and subcutaneous tissue: Secondary | ICD-10-CM | POA: Insufficient documentation

## 2015-03-14 DIAGNOSIS — S3210XA Unspecified fracture of sacrum, initial encounter for closed fracture: Secondary | ICD-10-CM

## 2015-03-14 DIAGNOSIS — Y9289 Other specified places as the place of occurrence of the external cause: Secondary | ICD-10-CM | POA: Insufficient documentation

## 2015-03-14 DIAGNOSIS — S199XXA Unspecified injury of neck, initial encounter: Secondary | ICD-10-CM | POA: Diagnosis present

## 2015-03-14 DIAGNOSIS — I10 Essential (primary) hypertension: Secondary | ICD-10-CM | POA: Insufficient documentation

## 2015-03-14 DIAGNOSIS — Z8639 Personal history of other endocrine, nutritional and metabolic disease: Secondary | ICD-10-CM | POA: Insufficient documentation

## 2015-03-14 DIAGNOSIS — Y998 Other external cause status: Secondary | ICD-10-CM | POA: Diagnosis not present

## 2015-03-14 DIAGNOSIS — K219 Gastro-esophageal reflux disease without esophagitis: Secondary | ICD-10-CM | POA: Diagnosis not present

## 2015-03-14 DIAGNOSIS — S128XXA Fracture of other parts of neck, initial encounter: Secondary | ICD-10-CM

## 2015-03-14 DIAGNOSIS — S322XXA Fracture of coccyx, initial encounter for closed fracture: Secondary | ICD-10-CM | POA: Diagnosis not present

## 2015-03-14 DIAGNOSIS — W19XXXA Unspecified fall, initial encounter: Secondary | ICD-10-CM

## 2015-03-14 NOTE — ED Notes (Signed)
Patient had an unwitnessed fall at [facility].  Small, deep laceration on chin.  He has frequent falls as well as dementia.  He is not on anticoagulant therapy.

## 2015-03-15 ENCOUNTER — Emergency Department (HOSPITAL_COMMUNITY): Payer: Medicare Other

## 2015-03-15 ENCOUNTER — Encounter (HOSPITAL_COMMUNITY): Payer: Self-pay | Admitting: Emergency Medicine

## 2015-03-15 DIAGNOSIS — S12490A Other displaced fracture of fifth cervical vertebra, initial encounter for closed fracture: Secondary | ICD-10-CM | POA: Diagnosis not present

## 2015-03-15 MED ORDER — LORAZEPAM 2 MG/ML IJ SOLN: 0.5000 mg | Freq: Once | INTRAMUSCULAR | Status: DC

## 2015-03-15 MED ORDER — LORAZEPAM 2 MG/ML IJ SOLN
0.5000 mg | Freq: Once | INTRAMUSCULAR | Status: AC
  Administered 2015-03-15: 0.5 mg via INTRAMUSCULAR
  Filled 2015-03-15: qty 1

## 2015-03-15 MED ORDER — HALOPERIDOL LACTATE 5 MG/ML IJ SOLN
2.0000 mg | Freq: Once | INTRAMUSCULAR | Status: AC
  Administered 2015-03-15: 2 mg via INTRAMUSCULAR
  Filled 2015-03-15: qty 1

## 2015-03-15 NOTE — ED Notes (Signed)
Communications notified of patient being ready to be transported by [facility].

## 2015-03-15 NOTE — ED Provider Notes (Signed)
CSN: [id number]     Arrival date & time 03/14/15  2245 History   First MD Initiated Contact with Patient 03/14/15 2303     Chief Complaint  Patient presents with  . Fall     (Consider location/radiation/quality/duration/timing/severity/associated sxs/prior Treatment) Patient is a 81 y.o. male presenting with fall. The history is provided by medical records. The history is limited by the condition of the patient (level 5 caveat dementia).  Fall This is a new problem. The current episode started 6 to 12 hours ago. The problem occurs constantly. The problem has not changed since onset.Pertinent negatives include no abdominal pain. Nothing aggravates the symptoms. Nothing relieves the symptoms. He has tried nothing for the symptoms. The treatment provided no relief.    Past Medical History  Diagnosis Date  . Hypertension   . Gout   . Hypercholesteremia   . Arthritis   . Acid reflux   . Cancer     Cancer-prostate  . Edema   . Psoriasis   . Alzheimer disease    Past Surgical History  Procedure Laterality Date  . Cataract extraction w/phaco  07/01/2012    Procedure: CATARACT EXTRACTION PHACO AND INTRAOCULAR LENS PLACEMENT (IOC);  Surgeon: Tonny Branch, MD;  Location: [facility];  Service: Ophthalmology;  Laterality: Right;  CDE 19.91  . Cataract extraction w/phaco  09/02/2012    Procedure: CATARACT EXTRACTION PHACO AND INTRAOCULAR LENS PLACEMENT (IOC);  Surgeon: Tonny Branch, MD;  Location: [facility];  Service: Ophthalmology;  Laterality: Left;  CDE 16.23  . Tonsillectomy    . Dilatation of esophageal stricture     Family History  Problem Relation Age of Onset  . Heart attack Mother   . Stroke Father    History  Substance Use Topics  . Smoking status: Never Smoker   . Smokeless tobacco: Never Used  . Alcohol Use: Yes     Comment: 2 glasses of wine daily    Review of Systems  Unable to perform ROS Gastrointestinal: Negative for abdominal pain.      Allergies  Review of patient's  allergies indicates no known allergies.  Home Medications   Prior to Admission medications   Medication Sig Start Date End Date Taking? Authorizing Provider  acetaminophen (TYLENOL) 325 MG tablet Take 650 mg by mouth every 6 (six) hours as needed for moderate pain.   Yes Historical Provider, MD  donepezil (ARICEPT) 10 MG tablet Take 10 mg by mouth daily with breakfast.   Yes Historical Provider, MD  ENSURE (ENSURE) Take 237 mLs by mouth daily with breakfast.   Yes Historical Provider, MD  etodolac (LODINE) 400 MG tablet Take 400 mg by mouth daily with breakfast.    Yes Historical Provider, MD  furosemide (LASIX) 20 MG tablet Take 20 mg by mouth daily with breakfast.    Yes Historical Provider, MD  lisinopril (PRINIVIL,ZESTRIL) 20 MG tablet Take 20 mg by mouth daily.   Yes Historical Provider, MD  loperamide (IMODIUM) 2 MG capsule Take 2 mg by mouth every 4 (four) hours as needed for diarrhea or loose stools.   Yes Historical Provider, MD  LORazepam (ATIVAN) 0.5 MG tablet TAKE 1 TABLET BY MOUTH EVERY EIGHT HOURS AS NEEDED FOR ANXIETY. Patient taking differently: TAKE 1 TABLET BY MOUTH EVERY SIX HOURS AS NEEDED FOR ANXIETY. 12/04/14  Yes Asencion Partridge Dohmeier, MD  LORazepam (ATIVAN) 0.5 MG tablet Take 0.5 mg by mouth daily. @@ 4 PM   Yes Historical Provider, MD  Multiple Vitamins-Minerals (ELDERTONIC PO) Take 15  mLs by mouth 3 (three) times daily.   Yes Historical Provider, MD  Multiple Vitamins-Minerals (I-VITE) TABS Take 1 tablet by mouth daily with breakfast.   Yes Historical Provider, MD  omeprazole (PRILOSEC) 20 MG capsule Take 20 mg by mouth daily with breakfast.    Yes Historical Provider, MD  probenecid (BENEMID) 500 MG tablet Take 500 mg by mouth daily with breakfast.    Yes Historical Provider, MD  risperiDONE (RISPERDAL) 0.25 MG tablet Take 1 tablet (0.25 mg total) by mouth 2 (two) times daily. 01/02/15  Yes Megan Spero Curb, NP  chlorproMAZINE (THORAZINE) 25 MG tablet Take 25 mg by mouth every 6  (six) hours as needed (for hiccups).    Historical Provider, MD   BP 123/55 mmHg  Pulse 55  Temp(Src) 98.7 F (37.1 C) (Axillary)  Resp 18  SpO2 99% Physical Exam  Constitutional: He appears well-developed and well-nourished. No distress.  HENT:  Head: Normocephalic. Head is without raccoon's eyes and without Battle's sign.  Mouth/Throat: Oropharynx is clear and moist.  Abrasion to the chin  Eyes: Conjunctivae are normal. Pupils are equal, round, and reactive to light.  Neck: Normal range of motion. Neck supple.  Cardiovascular: Normal rate, regular rhythm and intact distal pulses.   Pulmonary/Chest: Effort normal and breath sounds normal. No respiratory distress. He has no wheezes. He has no rales.  Abdominal: Soft. Bowel sounds are normal. There is no tenderness. There is no rebound and no guarding.  Musculoskeletal: Normal range of motion. He exhibits no edema or tenderness.  Neurological: He is alert. He has normal reflexes.  Skin: Skin is warm and dry.  Psychiatric: He has a normal mood and affect.    ED Course  Procedures (including critical care time) Labs Review Labs Reviewed - No data to display  Imaging Review No results found.   EKG Interpretation None      MDM   Final diagnoses:  Fall    c5 lamina fracture call placed to neurosurgery,  1256 am Per Dr. Cyndy Freeze, C collar and follow up with neurosurgery as an outpatient.     Will need to see his PMD for sacral fracture and outpatient follow up with Dr. Cyndy Freeze for lamina fracture must wear collar 24/7.    Veatrice Kells, MD 03/15/15 [id number]

## 2015-03-15 NOTE — Discharge Instructions (Signed)
Cervical Spine Fracture, Stable °A cervical spine fracture is a break or crack in one of the bones of the neck. A fracture is stable if the chances of it causing you problems while it is healing are very small. °CAUSES  °· Vehicle accidents. °· Injuries from sports such as diving, football, biking, wrestling, or skiing. °· Occasionally, severe osteoporosis or other bone diseases, such as cancers that spread to bone or metabolic abnormalities. °SYMPTOMS  °· Severe neck pain after an accident or fall. °· Pain down your shoulders or arms. °· Bruising or swelling on the back of your neck. °· Numbness, tingling, muscle spasm, or weakness. °DIAGNOSIS  °Cervical spine fracture is diagnosed with the help of X-ray exams of your neck. Often a CT scan or MRI is used to confirm the diagnosis and help determine how your injury should be treated. Generally, an examination of your neck, arms, and legs, and the history of your injury prompts the health care provider to order these tests.  °TREATMENT  °A stable fracture needs to be treated with a brace or cervical collar. A cervical collar is a two-piece collar designed to keep your neck from moving during the healing process. °HOME CARE INSTRUCTIONS °· Limit physical activity to prevent worsening of the fracture. °· Apply ice to areas of pain 3-4 times a day for 2 days. °¨ Put ice in a bag. °¨ Place a towel between your skin and the bag. °¨ Leave the ice on for 15-20 minutes, 3-4 times a day. °· You may have been given a cervical collar to wear. °¨ Do not remove the collar unless instructed by your health care provider. °¨ If you have long hair, keep it outside of the collar. °¨ Ask your health care provider before making any adjustments to your collar. Minor adjustments may be required over time to improve comfort and reduce pressure on your chin or on the back of your head. °¨ Keep your collar clean by wiping it with mild soap and water and drying it completely. The pads can be  hand washed with soap and water and air dried completely. °¨ If you are allowed to remove the collar for cleaning or bathing, follow your health care provider's instructions on how to do so safely. °¨ If you are allowed to remove the collar for cleaning and bathing, wash and dry the skin of your neck. Check your skin for irritation or sores. If you see any, tell your health care provider. °· Only take over-the-counter or prescription medicines for pain, discomfort, or fever as directed by your health care provider.   °· Keep all follow-up appointments as directed by your health care provider. Not keeping an appointment could result in a chronic or permanent injury, pain, and disability. Additionally, X-rays or an MRI may be repeated 1-3 weeks after your initial appointment. This is to: °¨ Make sure any other breaks or cracks were not missed.   °¨ Help identify stretched or torn ligaments.   °· Get your test results if you did not get them when you were first evaluated. The results will determine whether you need other tests or treatment. It is your responsibility to get the results. °SEEK MEDICAL CARE IF: °You have irritation or sores on your skin from the cervical collar. °SEEK IMMEDIATE MEDICAL CARE IF:  °· You have increasing pain in your neck.   °· You develop difficulties swallowing or breathing. °· You develop swelling in your neck.   °· You have numbness, weakness, burning pain, or movement   problems in the arms or legs.   °· You are unable to control your bowel or bladder (incontinence).   °· You have problems with coordination or difficulty walking. °MAKE SURE YOU:  °· Understand these instructions. °· Will watch your condition. °· Will get help right away if you are not doing well or get worse. °Document Released: 10/04/2004 Document Revised: 11/22/2013 Document Reviewed: 06/13/2013 °[employer]® Patient Information ©2015 [employer]. This information is not intended to replace advice given to you by your  health care provider. Make sure you discuss any questions you have with your health care provider. ° °

## 2015-03-15 NOTE — ED Notes (Signed)
C-collar taped in place per Dr. Lynnae January request, since patient repeatedly pulls it off.

## 2015-03-15 NOTE — ED Notes (Signed)
Patient is now awake, after resting comfortably for several minutes.  Repeatedly pulling at collar, requiring frequent reminders by staff to leave it alone.

## 2015-03-19 ENCOUNTER — Encounter (HOSPITAL_COMMUNITY): Payer: Self-pay | Admitting: Emergency Medicine

## 2015-03-19 ENCOUNTER — Emergency Department (HOSPITAL_COMMUNITY)
Admission: EM | Admit: 2015-03-19 | Discharge: 2015-03-19 | Disposition: A | Discharge status: Home / Self Care | Payer: Medicare Other | Attending: Emergency Medicine | Admitting: Emergency Medicine

## 2015-03-19 ENCOUNTER — Emergency Department (HOSPITAL_COMMUNITY): Payer: Medicare Other

## 2015-03-19 DIAGNOSIS — I10 Essential (primary) hypertension: Secondary | ICD-10-CM | POA: Insufficient documentation

## 2015-03-19 DIAGNOSIS — W1839XA Other fall on same level, initial encounter: Secondary | ICD-10-CM | POA: Insufficient documentation

## 2015-03-19 DIAGNOSIS — F0280 Dementia in other diseases classified elsewhere without behavioral disturbance: Secondary | ICD-10-CM | POA: Diagnosis not present

## 2015-03-19 DIAGNOSIS — Z8546 Personal history of malignant neoplasm of prostate: Secondary | ICD-10-CM | POA: Diagnosis not present

## 2015-03-19 DIAGNOSIS — Z8781 Personal history of (healed) traumatic fracture: Secondary | ICD-10-CM | POA: Diagnosis not present

## 2015-03-19 DIAGNOSIS — S79912A Unspecified injury of left hip, initial encounter: Secondary | ICD-10-CM | POA: Insufficient documentation

## 2015-03-19 DIAGNOSIS — Y9389 Activity, other specified: Secondary | ICD-10-CM | POA: Insufficient documentation

## 2015-03-19 DIAGNOSIS — Y92128 Other place in nursing home as the place of occurrence of the external cause: Secondary | ICD-10-CM | POA: Diagnosis not present

## 2015-03-19 DIAGNOSIS — S79911A Unspecified injury of right hip, initial encounter: Secondary | ICD-10-CM | POA: Diagnosis present

## 2015-03-19 DIAGNOSIS — G309 Alzheimer's disease, unspecified: Secondary | ICD-10-CM | POA: Insufficient documentation

## 2015-03-19 DIAGNOSIS — Z7901 Long term (current) use of anticoagulants: Secondary | ICD-10-CM | POA: Insufficient documentation

## 2015-03-19 DIAGNOSIS — M109 Gout, unspecified: Secondary | ICD-10-CM | POA: Diagnosis not present

## 2015-03-19 DIAGNOSIS — K219 Gastro-esophageal reflux disease without esophagitis: Secondary | ICD-10-CM | POA: Diagnosis not present

## 2015-03-19 DIAGNOSIS — Z872 Personal history of diseases of the skin and subcutaneous tissue: Secondary | ICD-10-CM | POA: Insufficient documentation

## 2015-03-19 DIAGNOSIS — Y998 Other external cause status: Secondary | ICD-10-CM | POA: Diagnosis not present

## 2015-03-19 DIAGNOSIS — Y92129 Unspecified place in nursing home as the place of occurrence of the external cause: Secondary | ICD-10-CM

## 2015-03-19 DIAGNOSIS — M1990 Unspecified osteoarthritis, unspecified site: Secondary | ICD-10-CM | POA: Insufficient documentation

## 2015-03-19 DIAGNOSIS — W19XXXA Unspecified fall, initial encounter: Secondary | ICD-10-CM

## 2015-03-19 DIAGNOSIS — Z79899 Other long term (current) drug therapy: Secondary | ICD-10-CM | POA: Diagnosis not present

## 2015-03-19 MED ORDER — LORAZEPAM 0.5 MG PO TABS
0.5000 mg | ORAL_TABLET | Freq: Once | ORAL | Status: AC
  Administered 2015-03-19: 0.5 mg via ORAL
  Filled 2015-03-19: qty 1

## 2015-03-19 MED ORDER — ACETAMINOPHEN 325 MG PO TABS
650.0000 mg | ORAL_TABLET | Freq: Once | ORAL | Status: DC
  Filled 2015-03-19: qty 2

## 2015-03-19 NOTE — ED Notes (Signed)
Pt ambulated to stretcher.

## 2015-03-19 NOTE — Discharge Instructions (Signed)
Return to the emergency room with worsening of symptoms, new symptoms or with symptoms that are concerning.  Please call your doctor for a followup appointment within 24-48 hours. When you talk to your doctor please let them know that you were seen in the emergency department and have them acquire all of your records so that they can discuss the findings with you and formulate a treatment plan to fully care for your new and ongoing problems.    Fall Prevention and Home Safety Falls cause injuries and can affect all age groups. It is possible to prevent falls.  HOW TO PREVENT FALLS  Wear shoes with rubber soles that do not have an opening for your toes.  Keep the inside and outside of your house well lit.  Use night lights throughout your home.  Remove clutter from floors.  Clean up floor spills.  Remove throw rugs or fasten them to the floor with carpet tape.  Do not place electrical cords across pathways.  Put grab bars by your tub, shower, and toilet. Do not use towel bars as grab bars.  Put handrails on both sides of the stairway. Fix loose handrails.  Do not climb on stools or stepladders, if possible.  Do not wax your floors.  Repair uneven or unsafe sidewalks, walkways, or stairs.  Keep items you use a lot within reach.  Be aware of pets.  Keep emergency numbers next to the telephone.  Put smoke detectors in your home and near bedrooms. Ask your doctor what other things you can do to prevent falls. Document Released: 09/13/2009 Document Revised: 05/18/2012 Document Reviewed: 02/17/2012 [facility] Patient Information 2015 [place], [place]. This information is not intended to replace advice given to you by your health care provider. Make sure you discuss any questions you have with your health care provider.

## 2015-03-19 NOTE — ED Notes (Signed)
Pt is too irritated and confused to ambulate at this time. We will stand the pt up when [facility] gets here to get him onto the stretcher.

## 2015-03-19 NOTE — ED Notes (Signed)
Pt arrived via EMS from [facility] for fall. Pt trying to sit in rocking and missed it and fell landing on rt side. Pt in fully immobilized. (-)LOC, (-)N/V. A/O to self and this is pt's normal per staff.

## 2015-03-19 NOTE — ED Provider Notes (Signed)
CSN: [id number]     Arrival date & time 03/19/15  1421 History   First MD Initiated Contact with Patient 03/19/15 1500     Chief Complaint  Patient presents with  . Fall     (Consider location/radiation/quality/duration/timing/severity/associated sxs/prior Treatment) HPI  William Ramos is a 81 y.o. male with PMH of Alzheimer's disease, prostate cancer presenting with fall. Patient lives at [facility]. Per nursing tech  patient was attempting to sit down and missed the chair and landed on his buttocks and complaining of left arm pain. Fall was witnessed. No head injury or loss of consciousness. No nausea or vomiting. Patient denies any pain at this time. Level V caveat. Patient oriented to self and this is baseline. Patient in c-collar for cervical fracture diagnosed 4 days ago. No anticoagulant use.   Past Medical History  Diagnosis Date  . Hypertension   . Gout   . Hypercholesteremia   . Arthritis   . Acid reflux   . Cancer     Cancer-prostate  . Edema   . Psoriasis   . Alzheimer disease    Past Surgical History  Procedure Laterality Date  . Cataract extraction w/phaco  07/01/2012    Procedure: CATARACT EXTRACTION PHACO AND INTRAOCULAR LENS PLACEMENT (IOC);  Surgeon: Tonny Branch, MD;  Location: [facility];  Service: Ophthalmology;  Laterality: Right;  CDE 19.91  . Cataract extraction w/phaco  09/02/2012    Procedure: CATARACT EXTRACTION PHACO AND INTRAOCULAR LENS PLACEMENT (IOC);  Surgeon: Tonny Branch, MD;  Location: [facility];  Service: Ophthalmology;  Laterality: Left;  CDE 16.23  . Tonsillectomy    . Dilatation of esophageal stricture     Family History  Problem Relation Age of Onset  . Heart attack Mother   . Stroke Father    History  Substance Use Topics  . Smoking status: Never Smoker   . Smokeless tobacco: Never Used  . Alcohol Use: Yes     Comment: 2 glasses of wine daily    Review of Systems  Unable to perform ROS due to  dementia    Allergies  Review of patient's allergies indicates no known allergies.  Home Medications   Prior to Admission medications   Medication Sig Start Date End Date Taking? Authorizing Provider  acetaminophen (TYLENOL) 325 MG tablet Take 650 mg by mouth every 6 (six) hours as needed for moderate pain.   Yes Historical Provider, MD  chlorproMAZINE (THORAZINE) 25 MG tablet Take 25 mg by mouth every 6 (six) hours as needed (for hiccups).   Yes Historical Provider, MD  donepezil (ARICEPT) 10 MG tablet Take 10 mg by mouth daily with breakfast.   Yes Historical Provider, MD  ENSURE (ENSURE) Take 237 mLs by mouth daily with breakfast.   Yes Historical Provider, MD  etodolac (LODINE) 400 MG tablet Take 400 mg by mouth daily with breakfast.    Yes Historical Provider, MD  furosemide (LASIX) 20 MG tablet Take 20 mg by mouth daily with breakfast.    Yes Historical Provider, MD  lisinopril (PRINIVIL,ZESTRIL) 20 MG tablet Take 20 mg by mouth daily after breakfast.    Yes Historical Provider, MD  loperamide (IMODIUM) 2 MG capsule Take 2 mg by mouth every 4 (four) hours as needed for diarrhea or loose stools.   Yes Historical Provider, MD  LORazepam (ATIVAN) 0.5 MG tablet TAKE 1 TABLET BY MOUTH EVERY EIGHT HOURS AS NEEDED FOR ANXIETY. Patient taking differently: TAKE 1 TABLET BY MOUTH EVERY SIX HOURS AS  NEEDED FOR ANXIETY. 12/04/14  Yes Asencion Partridge Dohmeier, MD  LORazepam (ATIVAN) 0.5 MG tablet Take 0.5 mg by mouth daily. @@ 4 PM   Yes Historical Provider, MD  Multiple Vitamins-Minerals (ELDERTONIC PO) Take 15 mLs by mouth 3 (three) times daily.   Yes Historical Provider, MD  Multiple Vitamins-Minerals (I-VITE) TABS Take 1 tablet by mouth daily with breakfast.   Yes Historical Provider, MD  Nutritional Supplements (NUTRI-DRINK PLUS PO) Take 1 Can by mouth 3 (three) times daily. *Great Shakes*   Yes Historical Provider, MD  omeprazole (PRILOSEC) 20 MG capsule Take 20 mg by mouth daily with breakfast.    Yes  Historical Provider, MD  probenecid (BENEMID) 500 MG tablet Take 500 mg by mouth daily with breakfast.    Yes Historical Provider, MD  risperiDONE (RISPERDAL) 0.25 MG tablet Take 1 tablet (0.25 mg total) by mouth 2 (two) times daily. 01/02/15  Yes Megan P Millikan, NP   BP 104/49 mmHg  Pulse 69  Temp(Src) 97.7 F (36.5 C) (Oral)  Resp 18  SpO2 100% Physical Exam  Constitutional: He appears well-developed and well-nourished. No distress.  HENT:  Head: Normocephalic and atraumatic.  Mouth/Throat: Oropharynx is clear and moist. No oropharyngeal exudate.  Eyes: Conjunctivae and EOM are normal. Right eye exhibits no discharge. Left eye exhibits no discharge.  Neck:  Pt in C-collar  Cardiovascular: Normal rate and regular rhythm.   Pulmonary/Chest: Effort normal and breath sounds normal. No respiratory distress. He has no wheezes.  Abdominal: Soft. Bowel sounds are normal. He exhibits no distension. There is no tenderness.  Neurological: He is alert. He exhibits normal muscle tone. Coordination normal.  Pt oriented to person. Not time or place. Pt follows simple commands. Speech fluent. Patient moves all four extremities without any focal neurological deficits and has no facial droop. Shuffling gait.  Skin: Skin is warm and dry. He is not diaphoretic.  Nursing note and vitals reviewed.   ED Course  Procedures (including critical care time) Labs Review Labs Reviewed - No data to display  Imaging Review Dg Hips Bilat With Pelvis 3-4 Views  03/19/2015   CLINICAL DATA:  Fall today with pelvic pain  EXAM: BILATERAL HIP (WITH PELVIS) 3-4 VIEWS  COMPARISON:  None.  FINDINGS: Pelvic ring is intact. Degenerative changes in the lumbar spine are seen. No acute fracture or dislocation is noted. No gross soft tissue abnormality is seen.  IMPRESSION: No acute abnormality noted.   Electronically Signed   By: Inez Catalina M.D.   On: 03/19/2015 16:15     EKG Interpretation None      MDM   Final  diagnoses:  Fall  Fall at nursing home, initial encounter   81 year old male presenting from nursing home after a witnessed fall. No head injury or loss of consciousness. No focal neurological deficits. Patient not complaining of any pain at this time. X-ray without acute abnormality. Patient stable for discharge to nursing facility with PCP follow-up. Pt seen by Dr. Regenia Skeeter who agrees with plan.     Al Corpus, PA-C 03/19/15 [place], MD 03/24/15 [phone]

## 2015-03-19 NOTE — ED Notes (Signed)
Bed: [facility] Expected date:  Expected time:  Means of arrival:  Comments: EMS- 81yo M, missed chair and fell, no complaints

## 2015-03-21 ENCOUNTER — Ambulatory Visit: Payer: Medicare Other | Admitting: Adult Health

## 2015-03-26 ENCOUNTER — Emergency Department (HOSPITAL_COMMUNITY)
Admission: EM | Admit: 2015-03-26 | Discharge: 2015-03-27 | Disposition: A | Discharge status: Home / Self Care | Payer: Medicare Other | Attending: Emergency Medicine | Admitting: Emergency Medicine

## 2015-03-26 ENCOUNTER — Emergency Department (HOSPITAL_COMMUNITY): Payer: Medicare Other

## 2015-03-26 ENCOUNTER — Encounter (HOSPITAL_COMMUNITY): Payer: Self-pay | Admitting: Emergency Medicine

## 2015-03-26 DIAGNOSIS — K219 Gastro-esophageal reflux disease without esophagitis: Secondary | ICD-10-CM | POA: Insufficient documentation

## 2015-03-26 DIAGNOSIS — Z8546 Personal history of malignant neoplasm of prostate: Secondary | ICD-10-CM | POA: Insufficient documentation

## 2015-03-26 DIAGNOSIS — F0280 Dementia in other diseases classified elsewhere without behavioral disturbance: Secondary | ICD-10-CM | POA: Insufficient documentation

## 2015-03-26 DIAGNOSIS — Z791 Long term (current) use of non-steroidal anti-inflammatories (NSAID): Secondary | ICD-10-CM | POA: Insufficient documentation

## 2015-03-26 DIAGNOSIS — M109 Gout, unspecified: Secondary | ICD-10-CM | POA: Insufficient documentation

## 2015-03-26 DIAGNOSIS — W19XXXA Unspecified fall, initial encounter: Secondary | ICD-10-CM

## 2015-03-26 DIAGNOSIS — Y998 Other external cause status: Secondary | ICD-10-CM | POA: Insufficient documentation

## 2015-03-26 DIAGNOSIS — I10 Essential (primary) hypertension: Secondary | ICD-10-CM | POA: Insufficient documentation

## 2015-03-26 DIAGNOSIS — Z79899 Other long term (current) drug therapy: Secondary | ICD-10-CM | POA: Insufficient documentation

## 2015-03-26 DIAGNOSIS — Z8639 Personal history of other endocrine, nutritional and metabolic disease: Secondary | ICD-10-CM | POA: Insufficient documentation

## 2015-03-26 DIAGNOSIS — Y9389 Activity, other specified: Secondary | ICD-10-CM | POA: Diagnosis not present

## 2015-03-26 DIAGNOSIS — M1990 Unspecified osteoarthritis, unspecified site: Secondary | ICD-10-CM | POA: Insufficient documentation

## 2015-03-26 DIAGNOSIS — Z043 Encounter for examination and observation following other accident: Secondary | ICD-10-CM | POA: Insufficient documentation

## 2015-03-26 DIAGNOSIS — Y92128 Other place in nursing home as the place of occurrence of the external cause: Secondary | ICD-10-CM | POA: Diagnosis not present

## 2015-03-26 DIAGNOSIS — Z872 Personal history of diseases of the skin and subcutaneous tissue: Secondary | ICD-10-CM | POA: Diagnosis not present

## 2015-03-26 DIAGNOSIS — W1839XA Other fall on same level, initial encounter: Secondary | ICD-10-CM | POA: Diagnosis not present

## 2015-03-26 DIAGNOSIS — G309 Alzheimer's disease, unspecified: Secondary | ICD-10-CM | POA: Insufficient documentation

## 2015-03-26 LAB — BASIC METABOLIC PANEL
Anion gap: 8 (ref 5–15)
BUN: 30 mg/dL — ABNORMAL HIGH (ref 6–23)
CO2: 25 mmol/L (ref 19–32)
Calcium: 8.8 mg/dL (ref 8.4–10.5)
Chloride: 106 mmol/L (ref 96–112)
Creatinine, Ser: 0.98 mg/dL (ref 0.50–1.35)
GFR calc Af Amer: 85 mL/min — ABNORMAL LOW (ref >90)
GFR calc non Af Amer: 73 mL/min — ABNORMAL LOW (ref >90)
GLUCOSE: 104 mg/dL — AB (ref 70–99)
POTASSIUM: 4.2 mmol/L (ref 3.5–5.1)
Sodium: 139 mmol/L (ref 135–145)

## 2015-03-26 LAB — CBC WITH DIFFERENTIAL/PLATELET
BASOS PCT: 0 % (ref 0–1)
Basophils Absolute: 0 10*3/uL (ref 0.0–0.1)
Eosinophils Absolute: 0.1 10*3/uL (ref 0.0–0.7)
Eosinophils Relative: 1 % (ref 0–5)
HCT: 30 % — ABNORMAL LOW (ref 39.0–52.0)
Hemoglobin: 9.7 g/dL — ABNORMAL LOW (ref 13.0–17.0)
LYMPHS PCT: 13 % (ref 12–46)
Lymphs Abs: 0.8 10*3/uL (ref 0.7–4.0)
MCH: 32.2 pg (ref 26.0–34.0)
MCHC: 32.3 g/dL (ref 30.0–36.0)
MCV: 99.7 fL (ref 78.0–100.0)
MONOS PCT: 11 % (ref 3–12)
Monocytes Absolute: 0.7 10*3/uL (ref 0.1–1.0)
NEUTROS ABS: 4.5 10*3/uL (ref 1.7–7.7)
Neutrophils Relative %: 75 % (ref 43–77)
Platelets: 246 10*3/uL (ref 150–400)
RBC: 3.01 MIL/uL — ABNORMAL LOW (ref 4.22–5.81)
RDW: 14.1 % (ref 11.5–15.5)
WBC: 6 10*3/uL (ref 4.0–10.5)

## 2015-03-26 MED ORDER — GI COCKTAIL ~~LOC~~: 30.0000 mL | Freq: Once | ORAL | Status: DC

## 2015-03-26 NOTE — ED Provider Notes (Signed)
CSN: [id number]     Arrival date & time 03/26/15  2103 History   First MD Initiated Contact with Patient 03/26/15 2152     Chief Complaint  Patient presents with  . Fall  . Dementia     (Consider location/radiation/quality/duration/timing/severity/associated sxs/prior Treatment) Patient is a 81 y.o. male presenting with fall. The history is provided by the EMS personnel. No language interpreter was used.  Fall  William Ramos is an 81 y.o male with a history of dementia, HTN, and hyperlipidemia who presents today by EMS for an unwitnessed fall while at [facility] facility.  The nursing staff found him laying on the floor. He has been see multiple times for falls in the ED. He has a towel wrapped around his neck with tape which was placed by EMS.  He is unable to verbalize what happened.  He speaks in single words.  He is agitated and tries to get up from the bed at times and other times he is in deep sleep. He denies any pain.    Past Medical History  Diagnosis Date  . Hypertension   . Gout   . Hypercholesteremia   . Arthritis   . Acid reflux   . Cancer     Cancer-prostate  . Edema   . Psoriasis   . Alzheimer disease    Past Surgical History  Procedure Laterality Date  . Cataract extraction w/phaco  07/01/2012    Procedure: CATARACT EXTRACTION PHACO AND INTRAOCULAR LENS PLACEMENT (IOC);  Surgeon: Tonny Branch, MD;  Location: [facility];  Service: Ophthalmology;  Laterality: Right;  CDE 19.91  . Cataract extraction w/phaco  09/02/2012    Procedure: CATARACT EXTRACTION PHACO AND INTRAOCULAR LENS PLACEMENT (IOC);  Surgeon: Tonny Branch, MD;  Location: [facility];  Service: Ophthalmology;  Laterality: Left;  CDE 16.23  . Tonsillectomy    . Dilatation of esophageal stricture     Family History  Problem Relation Age of Onset  . Heart attack Mother   . Stroke Father    History  Substance Use Topics  . Smoking status: Never Smoker   . Smokeless tobacco: Never Used  . Alcohol Use: Yes      Comment: 2 glasses of wine daily    Review of Systems  Unable to perform ROS     Allergies  Review of patient's allergies indicates no known allergies.  Home Medications   Prior to Admission medications   Medication Sig Start Date End Date Taking? Authorizing Provider  acetaminophen (TYLENOL) 325 MG tablet Take 650 mg by mouth every 6 (six) hours as needed for moderate pain (pain).    Yes Historical Provider, MD  donepezil (ARICEPT) 10 MG tablet Take 10 mg by mouth daily with breakfast.   Yes Historical Provider, MD  ENSURE (ENSURE) Take 237 mLs by mouth daily with breakfast.   Yes Historical Provider, MD  etodolac (LODINE) 400 MG tablet Take 400 mg by mouth daily with breakfast.    Yes Historical Provider, MD  furosemide (LASIX) 20 MG tablet Take 20 mg by mouth daily with breakfast.    Yes Historical Provider, MD  lisinopril (PRINIVIL,ZESTRIL) 20 MG tablet Take 20 mg by mouth daily after breakfast.    Yes Historical Provider, MD  loperamide (IMODIUM) 2 MG capsule Take 2 mg by mouth every 4 (four) hours as needed for diarrhea or loose stools (diarrhea).    Yes Historical Provider, MD  LORazepam (ATIVAN) 0.5 MG tablet TAKE 1 TABLET BY MOUTH EVERY EIGHT HOURS AS  NEEDED FOR ANXIETY. 12/04/14  Yes Asencion Partridge Dohmeier, MD  LORazepam (ATIVAN) 0.5 MG tablet Take 0.5 mg by mouth daily. @@ 4 PM   Yes Historical Provider, MD  Multiple Vitamins-Minerals (ELDERTONIC PO) Take 15 mLs by mouth 3 (three) times daily.   Yes Historical Provider, MD  Multiple Vitamins-Minerals (I-VITE) TABS Take 1 tablet by mouth daily with breakfast.   Yes Historical Provider, MD  omeprazole (PRILOSEC) 20 MG capsule Take 20 mg by mouth daily with breakfast.    Yes Historical Provider, MD  probenecid (BENEMID) 500 MG tablet Take 500 mg by mouth daily with breakfast.    Yes Historical Provider, MD  risperiDONE (RISPERDAL) 0.25 MG tablet Take 1 tablet (0.25 mg total) by mouth 2 (two) times daily. 01/02/15  Yes Megan Spero Curb, NP   chlorproMAZINE (THORAZINE) 25 MG tablet Take 25 mg by mouth every 6 (six) hours as needed (for hiccups).    Historical Provider, MD   BP 100/53 mmHg  Pulse 52  Temp(Src) 98 F (36.7 C) (Oral)  Resp 18  SpO2 98% Physical Exam  Constitutional: He is oriented to person, place, and time. He appears well-developed and well-nourished.  HENT:  Head: Normocephalic and atraumatic.  Old 1cm healing scab on the back of the head.  Eyes: Conjunctivae are normal.  Neck: Normal range of motion. Neck supple.  No midline tenderness along the cervical, thoracic or lumbar spine.   Cardiovascular: Normal rate, regular rhythm and normal heart sounds.   Pulmonary/Chest: Effort normal and breath sounds normal. No respiratory distress.  Abdominal: Soft. Normal appearance. There is no tenderness.  Musculoskeletal: Normal range of motion.  Neurological: He is alert and oriented to person, place, and time.  Skin: Skin is warm and dry.  2cm bleeding scab over the lateral left olecranon. No laceration or abrasion.   He has multiple healing scabs on the arms and legs.  No obvious deformity noted.    Nursing note and vitals reviewed.   ED Course  Procedures (including critical care time) Labs Review Labs Reviewed  CBC WITH DIFFERENTIAL/PLATELET - Abnormal; Notable for the following:    RBC 3.01 (*)    Hemoglobin 9.7 (*)    HCT 30.0 (*)    All other components within normal limits  BASIC METABOLIC PANEL - Abnormal; Notable for the following:    Glucose, Bld 104 (*)    BUN 30 (*)    GFR calc non Af Amer 73 (*)    GFR calc Af Amer 85 (*)    All other components within normal limits  URINALYSIS, ROUTINE W REFLEX MICROSCOPIC    Imaging Review Ct Head Wo Contrast  03/27/2015   CLINICAL DATA:  Unwitnessed fall, found down on nursing home floor. Dementia. History of prostate cancer and Alzheimer's.  EXAM: CT HEAD WITHOUT CONTRAST  TECHNIQUE: Contiguous axial images were obtained from the base of the  skull through the vertex without intravenous contrast.  COMPARISON:  CT of head and cervical spine March 14, 2015  FINDINGS: Motion degraded examination; patient scanned multiple times with minimal image quality improvement.  The ventricles and sulci are normal for age. No intraparenchymal hemorrhage, mass effect nor midline shift. Patchy supratentorial white matter hypodensities are within normal range for patient's age and though non-specific suggest sequelae of chronic small vessel ischemic disease. No acute large vascular territory infarcts. Remote LEFT cerebellar infarct was present previously. RIGHT mesial occipital lobe encephalomalacia again noted.  No abnormal extra-axial fluid collections. Basal cisterns are patent. Moderate calcific  atherosclerosis of the carotid siphons.  No skull fracture. The included ocular globes and orbital contents are non-suspicious. The mastoid aircells and included paranasal sinuses are well-aerated.  IMPRESSION: No definite acute intracranial process though motion degrades sensitivity.  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 At least moderate white matter changes suggest chronic small vessel ischemic disease.   Electronically Signed   By: Elon Alas   On: 03/27/2015 00:11     EKG Interpretation None      MDM   Final diagnoses:  Fall, initial encounter  Patient presents for unwitnessed fall while at [facility] facility.  He has no obvious deformity. CT scan is negative for acute bleed or infarct.  Patient has anemia with hGB of 9.7 today but it has low previously with a hGB of 9.5 one month ago. Labs are otherwise okay. I discussed this patient with Dr. Johnney Killian who agrees with the plan.  His urine is pending. The plan is to have him transferred back to the living facility if UA is normal.   I discussed this patient with Antonietta Breach, PA-C.     Ottie Glazier, PA-C 03/27/15 0105  Charlesetta Shanks, MD 03/31/15 [phone]

## 2015-03-26 NOTE — ED Notes (Signed)
Pt ambulatory without difficulty. 

## 2015-03-26 NOTE — ED Notes (Signed)
Patient unable to urinate at this time. 

## 2015-03-26 NOTE — ED Notes (Signed)
Bed: [facility] Expected date:  Expected time:  Means of arrival:  Comments: EMS/81yo dementia

## 2015-03-26 NOTE — ED Notes (Addendum)
Per EMS, Pt from [facility] [place] presents to ED after unwitnessed fall. Pt was lying supine on floor. Pt has hx of dementia, A&O to baseline, with hx of many falls. No obvious deformity or injury noted.

## 2015-03-27 DIAGNOSIS — Z043 Encounter for examination and observation following other accident: Secondary | ICD-10-CM | POA: Diagnosis not present

## 2015-03-27 LAB — URINALYSIS, ROUTINE W REFLEX MICROSCOPIC
Glucose, UA: NEGATIVE mg/dL
HGB URINE DIPSTICK: NEGATIVE
Ketones, ur: NEGATIVE mg/dL
Leukocytes, UA: NEGATIVE
Nitrite: NEGATIVE
PROTEIN: NEGATIVE mg/dL
Specific Gravity, Urine: 1.011 (ref 1.005–1.030)
UROBILINOGEN UA: 0.2 mg/dL (ref 0.0–1.0)
pH: 6.5 (ref 5.0–8.0)

## 2015-03-27 NOTE — ED Notes (Signed)
Patient is alert and oriented x3.  He was given DC instructions and follow up visit instructions.  Patient gave verbal understanding.  He was DC ambulatory under his own power to home.  V/S stable.  He was not showing any signs of distress on DC 

## 2015-03-27 NOTE — ED Notes (Signed)
[facility] called  

## 2015-03-27 NOTE — Discharge Instructions (Signed)
Fall Prevention and Home Safety No acute CT findings.  Normal labs. Follow up with pcp.  Falls cause injuries and can affect all age groups. It is possible to use preventive measures to significantly decrease the likelihood of falls. There are many simple measures which can make your home safer and prevent falls. OUTDOORS  Repair cracks and edges of walkways and driveways.  Remove high doorway thresholds.  Trim shrubbery on the main path into your home.  Have good outside lighting.  Clear walkways of tools, rocks, debris, and clutter.  Check that handrails are not broken and are securely fastened. Both sides of steps should have handrails.  Have leaves, snow, and ice cleared regularly.  Use sand or salt on walkways during winter months.  In the garage, clean up grease or oil spills. BATHROOM  Install night lights.  Install grab bars by the toilet and in the tub and shower.  Use non-skid mats or decals in the tub or shower.  Place a plastic non-slip stool in the shower to sit on, if needed.  Keep floors dry and clean up all water on the floor immediately.  Remove soap buildup in the tub or shower on a regular basis.  Secure bath mats with non-slip, double-sided rug tape.  Remove throw rugs and tripping hazards from the floors. BEDROOMS  Install night lights.  Make sure a bedside light is easy to reach.  Do not use oversized bedding.  Keep a telephone by your bedside.  Have a firm chair with side arms to use for getting dressed.  Remove throw rugs and tripping hazards from the floor. KITCHEN  Keep handles on pots and pans turned toward the center of the stove. Use back burners when possible.  Clean up spills quickly and allow time for drying.  Avoid walking on wet floors.  Avoid hot utensils and knives.  Position shelves so they are not too high or low.  Place commonly used objects within easy reach.  If necessary, use a sturdy step stool with a grab  bar when reaching.  Keep electrical cables out of the way.  Do not use floor polish or wax that makes floors slippery. If you must use wax, use non-skid floor wax.  Remove throw rugs and tripping hazards from the floor. STAIRWAYS  Never leave objects on stairs.  Place handrails on both sides of stairways and use them. Fix any loose handrails. Make sure handrails on both sides of the stairways are as long as the stairs.  Check carpeting to make sure it is firmly attached along stairs. Make repairs to worn or loose carpet promptly.  Avoid placing throw rugs at the top or bottom of stairways, or properly secure the rug with carpet tape to prevent slippage. Get rid of throw rugs, if possible.  Have an electrician put in a light switch at the top and bottom of the stairs. OTHER FALL PREVENTION TIPS  Wear low-heel or rubber-soled shoes that are supportive and fit well. Wear closed toe shoes.  When using a stepladder, make sure it is fully opened and both spreaders are firmly locked. Do not climb a closed stepladder.  Add color or contrast paint or tape to grab bars and handrails in your home. Place contrasting color strips on first and last steps.  Learn and use mobility aids as needed. Install an electrical emergency response system.  Turn on lights to avoid dark areas. Replace light bulbs that burn out immediately. Get light switches that glow.  Arrange furniture to create clear pathways. Keep furniture in the same place.  Firmly attach carpet with non-skid or double-sided tape.  Eliminate uneven floor surfaces.  Select a carpet pattern that does not visually hide the edge of steps.  Be aware of all pets. OTHER HOME SAFETY TIPS  Set the water temperature for 120 F (48.8 C).  Keep emergency numbers on or near the telephone.  Keep smoke detectors on every level of the home and near sleeping areas. Document Released: 11/07/2002 Document Revised: 05/18/2012 Document Reviewed:  02/06/2012 [facility] Patient Information 2015 [place], [place]. This information is not intended to replace advice given to you by your health care provider. Make sure you discuss any questions you have with your health care provider.

## 2015-03-27 NOTE — ED Provider Notes (Signed)
0130 - Patient care assumed from [facility], PA-C at shift change. Patient presenting for further evaluation of fall. He has been seen multiple times in the past for same. Urinalysis pending at shift change. Plan discussed with Patel-Mills, PA-C which includes discharge if urinalysis is negative. Findings reviewed which show no evidence of UTI. Patient stable for discharge at this time. Return precautions given.  Filed Vitals:   03/26/15 2104 03/26/15 2114  BP:  100/53  Pulse:  52  Temp:  98 F (36.7 C)  TempSrc:  Oral  Resp:  18  SpO2: 96% 98%     Antonietta Breach, PA-C 03/27/15 0133  Debby Freiberg, MD 03/27/15 [phone]

## 2015-03-28 ENCOUNTER — Encounter (HOSPITAL_COMMUNITY): Payer: Self-pay

## 2015-03-28 ENCOUNTER — Emergency Department (HOSPITAL_COMMUNITY)
Admission: EM | Admit: 2015-03-28 | Discharge: 2015-03-28 | Disposition: A | Discharge status: Home / Self Care | Payer: Medicare Other | Attending: Emergency Medicine | Admitting: Emergency Medicine

## 2015-03-28 ENCOUNTER — Emergency Department (HOSPITAL_COMMUNITY): Payer: Medicare Other

## 2015-03-28 DIAGNOSIS — S29092A Other injury of muscle and tendon of back wall of thorax, initial encounter: Secondary | ICD-10-CM | POA: Diagnosis not present

## 2015-03-28 DIAGNOSIS — Z79899 Other long term (current) drug therapy: Secondary | ICD-10-CM | POA: Insufficient documentation

## 2015-03-28 DIAGNOSIS — I10 Essential (primary) hypertension: Secondary | ICD-10-CM | POA: Diagnosis not present

## 2015-03-28 DIAGNOSIS — S3992XA Unspecified injury of lower back, initial encounter: Secondary | ICD-10-CM | POA: Insufficient documentation

## 2015-03-28 DIAGNOSIS — S0121XA Laceration without foreign body of nose, initial encounter: Secondary | ICD-10-CM

## 2015-03-28 DIAGNOSIS — Z872 Personal history of diseases of the skin and subcutaneous tissue: Secondary | ICD-10-CM | POA: Diagnosis not present

## 2015-03-28 DIAGNOSIS — Z8546 Personal history of malignant neoplasm of prostate: Secondary | ICD-10-CM | POA: Diagnosis not present

## 2015-03-28 DIAGNOSIS — F0280 Dementia in other diseases classified elsewhere without behavioral disturbance: Secondary | ICD-10-CM | POA: Diagnosis not present

## 2015-03-28 DIAGNOSIS — Z8639 Personal history of other endocrine, nutritional and metabolic disease: Secondary | ICD-10-CM | POA: Diagnosis not present

## 2015-03-28 DIAGNOSIS — Y9389 Activity, other specified: Secondary | ICD-10-CM | POA: Insufficient documentation

## 2015-03-28 DIAGNOSIS — S199XXA Unspecified injury of neck, initial encounter: Secondary | ICD-10-CM | POA: Insufficient documentation

## 2015-03-28 DIAGNOSIS — Y9289 Other specified places as the place of occurrence of the external cause: Secondary | ICD-10-CM | POA: Insufficient documentation

## 2015-03-28 DIAGNOSIS — K219 Gastro-esophageal reflux disease without esophagitis: Secondary | ICD-10-CM | POA: Insufficient documentation

## 2015-03-28 DIAGNOSIS — M1990 Unspecified osteoarthritis, unspecified site: Secondary | ICD-10-CM | POA: Insufficient documentation

## 2015-03-28 DIAGNOSIS — Y998 Other external cause status: Secondary | ICD-10-CM | POA: Diagnosis not present

## 2015-03-28 DIAGNOSIS — Z791 Long term (current) use of non-steroidal anti-inflammatories (NSAID): Secondary | ICD-10-CM | POA: Insufficient documentation

## 2015-03-28 DIAGNOSIS — G309 Alzheimer's disease, unspecified: Secondary | ICD-10-CM | POA: Insufficient documentation

## 2015-03-28 DIAGNOSIS — W19XXXA Unspecified fall, initial encounter: Secondary | ICD-10-CM | POA: Insufficient documentation

## 2015-03-28 DIAGNOSIS — S0992XA Unspecified injury of nose, initial encounter: Secondary | ICD-10-CM | POA: Diagnosis present

## 2015-03-28 LAB — BASIC METABOLIC PANEL
Anion gap: 5 (ref 5–15)
BUN: 27 mg/dL — AB (ref 6–23)
CO2: 26 mmol/L (ref 19–32)
CREATININE: 0.86 mg/dL (ref 0.50–1.35)
Calcium: 8.8 mg/dL (ref 8.4–10.5)
Chloride: 107 mmol/L (ref 96–112)
GFR, EST AFRICAN AMERICAN: 90 mL/min — AB (ref >90)
GFR, EST NON AFRICAN AMERICAN: 77 mL/min — AB (ref >90)
Glucose, Bld: 93 mg/dL (ref 70–99)
Potassium: 3.6 mmol/L (ref 3.5–5.1)
Sodium: 138 mmol/L (ref 135–145)

## 2015-03-28 LAB — CBC WITH DIFFERENTIAL/PLATELET
BASOS ABS: 0 10*3/uL (ref 0.0–0.1)
BASOS PCT: 0 % (ref 0–1)
EOS ABS: 0 10*3/uL (ref 0.0–0.7)
Eosinophils Relative: 1 % (ref 0–5)
HCT: 34.4 % — ABNORMAL LOW (ref 39.0–52.0)
Hemoglobin: 10.9 g/dL — ABNORMAL LOW (ref 13.0–17.0)
Lymphocytes Relative: 8 % — ABNORMAL LOW (ref 12–46)
Lymphs Abs: 0.6 10*3/uL — ABNORMAL LOW (ref 0.7–4.0)
MCH: 31.8 pg (ref 26.0–34.0)
MCHC: 31.7 g/dL (ref 30.0–36.0)
MCV: 100.3 fL — ABNORMAL HIGH (ref 78.0–100.0)
Monocytes Absolute: 0.8 10*3/uL (ref 0.1–1.0)
Monocytes Relative: 10 % (ref 3–12)
NEUTROS PCT: 81 % — AB (ref 43–77)
Neutro Abs: 6.7 10*3/uL (ref 1.7–7.7)
PLATELETS: 283 10*3/uL (ref 150–400)
RBC: 3.43 MIL/uL — ABNORMAL LOW (ref 4.22–5.81)
RDW: 14 % (ref 11.5–15.5)
WBC: 8.2 10*3/uL (ref 4.0–10.5)

## 2015-03-28 NOTE — ED Provider Notes (Addendum)
CSN: [id number]     Arrival date & time 03/28/15  5035 History   First MD Initiated Contact with Patient 03/28/15 [id number]     Chief Complaint  Patient presents with  . Fall    Patient is a 81 y.o. male presenting with fall. The history is provided by the patient and the nursing home. History limited by: pt's dementia and confusion.  Fall This is a recurrent problem. Episode onset: pt was found this morning at the nursing facility on the ground.   He has a laceration on his nose and was complaining of back pain. The problem occurs constantly. The problem has not changed since onset.Pertinent negatives include no chest pain, no abdominal pain, no headaches and no shortness of breath. Associated symptoms comments: He may have lost consciousness. The symptoms are aggravated by walking. Nothing relieves the symptoms.   the patient has been seen multiple times in the emergency room for similar symptoms. The patient is recurrently falling.  This is his 11th visit to the emergency room instead January for fall.  Past Medical History  Diagnosis Date  . Hypertension   . Gout   . Hypercholesteremia   . Arthritis   . Acid reflux   . Cancer     Cancer-prostate  . Edema   . Psoriasis   . Alzheimer disease    Past Surgical History  Procedure Laterality Date  . Cataract extraction w/phaco  07/01/2012    Procedure: CATARACT EXTRACTION PHACO AND INTRAOCULAR LENS PLACEMENT (IOC);  Surgeon: Tonny Branch, MD;  Location: [facility];  Service: Ophthalmology;  Laterality: Right;  CDE 19.91  . Cataract extraction w/phaco  09/02/2012    Procedure: CATARACT EXTRACTION PHACO AND INTRAOCULAR LENS PLACEMENT (IOC);  Surgeon: Tonny Branch, MD;  Location: [facility];  Service: Ophthalmology;  Laterality: Left;  CDE 16.23  . Tonsillectomy    . Dilatation of esophageal stricture     Family History  Problem Relation Age of Onset  . Heart attack Mother   . Stroke Father    History  Substance Use Topics  . Smoking status: Never  Smoker   . Smokeless tobacco: Never Used  . Alcohol Use: Yes     Comment: 2 glasses of wine daily    Review of Systems  Respiratory: Negative for shortness of breath.   Cardiovascular: Negative for chest pain.  Gastrointestinal: Negative for abdominal pain.  Neurological: Negative for headaches.  All other systems reviewed and are negative.     Allergies  Review of patient's allergies indicates no known allergies.  Home Medications   Prior to Admission medications   Medication Sig Start Date End Date Taking? Authorizing Provider  acetaminophen (TYLENOL) 325 MG tablet Take 650 mg by mouth every 6 (six) hours as needed for moderate pain (pain).    Yes Historical Provider, MD  chlorproMAZINE (THORAZINE) 25 MG tablet Take 25 mg by mouth every 6 (six) hours as needed (for hiccups).   Yes Historical Provider, MD  donepezil (ARICEPT) 10 MG tablet Take 10 mg by mouth daily with breakfast.   Yes Historical Provider, MD  ENSURE (ENSURE) Take 237 mLs by mouth daily with breakfast.   Yes Historical Provider, MD  etodolac (LODINE) 400 MG tablet Take 400 mg by mouth daily with breakfast.    Yes Historical Provider, MD  furosemide (LASIX) 20 MG tablet Take 20 mg by mouth daily with breakfast.    Yes Historical Provider, MD  lisinopril (PRINIVIL,ZESTRIL) 20 MG tablet Take 20 mg by mouth  daily after breakfast.    Yes Historical Provider, MD  loperamide (IMODIUM) 2 MG capsule Take 2 mg by mouth every 4 (four) hours as needed for diarrhea or loose stools (diarrhea).    Yes Historical Provider, MD  LORazepam (ATIVAN) 0.5 MG tablet TAKE 1 TABLET BY MOUTH EVERY EIGHT HOURS AS NEEDED FOR ANXIETY. Patient taking differently: TAKE 1 TABLET BY MOUTH EVERY six HOURS AS NEEDED FOR ANXIETY 12/04/14  Yes Asencion Partridge Dohmeier, MD  LORazepam (ATIVAN) 0.5 MG tablet Take 0.5 mg by mouth daily. @@ 4 PM   Yes Historical Provider, MD  Multiple Vitamins-Minerals (ELDERTONIC PO) Take 15 mLs by mouth 3 (three) times daily.   Yes  Historical Provider, MD  Multiple Vitamins-Minerals (I-VITE) TABS Take 1 tablet by mouth daily with breakfast.   Yes Historical Provider, MD  Nutritional Supplements (NUTRITIONAL SHAKE) LIQD Take 1 Can by mouth 3 (three) times daily. *Great Shakes*   Yes Historical Provider, MD  omeprazole (PRILOSEC) 20 MG capsule Take 20 mg by mouth daily with breakfast.    Yes Historical Provider, MD  probenecid (BENEMID) 500 MG tablet Take 500 mg by mouth daily with breakfast.    Yes Historical Provider, MD  risperiDONE (RISPERDAL) 0.25 MG tablet Take 1 tablet (0.25 mg total) by mouth 2 (two) times daily. 01/02/15  Yes Megan P Millikan, NP   BP 111/46 mmHg  Pulse 49  Temp(Src) 98.1 F (36.7 C) (Oral)  Resp 11  SpO2 100% Physical Exam  Constitutional: No distress.  Elderly, frail  HENT:  Head: Normocephalic. Head is without raccoon's eyes and without Battle's sign.  Right Ear: External ear normal.  Left Ear: External ear normal.  Less than 1 cm laceration the bridge of the nose with mild active bleeding  Eyes: Conjunctivae are normal. Right eye exhibits no discharge. Left eye exhibits no discharge. No scleral icterus.  Neck: Neck supple. No tracheal deviation present.  Cardiovascular: Normal rate, regular rhythm and intact distal pulses.   Pulmonary/Chest: Effort normal and breath sounds normal. No stridor. No respiratory distress. He has no wheezes. He has no rales.  Abdominal: Soft. Bowel sounds are normal. He exhibits no distension. There is no tenderness. There is no rebound and no guarding.  Musculoskeletal: He exhibits no edema.       Cervical back: He exhibits tenderness and bony tenderness.       Thoracic back: He exhibits tenderness and bony tenderness.       Lumbar back: He exhibits tenderness and bony tenderness.  Neurological: He is alert. He has normal strength. He is disoriented. No cranial nerve deficit (no facial droop, extraocular movements intact, no slurred speech) or sensory deficit.  He exhibits normal muscle tone. He displays no seizure activity. GCS eye subscore is 4. GCS verbal subscore is 4. GCS motor subscore is 6.  Patient is alert and awake, he is able to move all 4 extremities, he has some difficulty following commands and does take me asking him several times to comply,  Skin: Skin is warm and dry. No rash noted.  Psychiatric: He has a normal mood and affect.  Nursing note and vitals reviewed.   ED Course  LACERATION REPAIR Date/Time: 03/28/2015 8:00 AM Performed by: Dorie Rank Authorized by: Dorie Rank Consent: Verbal consent obtained. Body area: head/neck Location details: nose Laceration length: 1 cm Foreign bodies: no foreign bodies Tendon involvement: none Nerve involvement: none Patient sedated: no Irrigation solution: saline Amount of cleaning: standard Debridement: none Skin closure: glue Approximation: close Approximation  difficulty: simple Dressing: steri strips.   (including critical care time) Labs Review Labs Reviewed  CBC WITH DIFFERENTIAL/PLATELET - Abnormal; Notable for the following:    RBC 3.43 (*)    Hemoglobin 10.9 (*)    HCT 34.4 (*)    MCV 100.3 (*)    Neutrophils Relative % 81 (*)    Lymphocytes Relative 8 (*)    Lymphs Abs 0.6 (*)    All other components within normal limits  BASIC METABOLIC PANEL - Abnormal; Notable for the following:    BUN 27 (*)    GFR calc non Af Amer 77 (*)    GFR calc Af Amer 90 (*)    All other components within normal limits    Imaging Review Dg Thoracic Spine 2 View  03/28/2015   CLINICAL DATA:  Multiple recent falls.  Generalized back pain.  EXAM: THORACIC SPINE - 2 VIEW  COMPARISON:  Multiple exams, including 01/09/2015  FINDINGS: T12 and T6 compression fractures appear similar to the prior exam of 01/09/2015, each with about 40-50% loss of vertebral height. No new compression fracture or new subluxation is identified in the thoracic spine. Thoracic spondylosis noted.  IMPRESSION: 1.  Stable appearance of prior compression fractures at T6 and T12. No new fracture or progressive fracture.   Electronically Signed   By: Van Clines M.D.   On: 03/28/2015 09:01   Dg Lumbar Spine Complete  03/28/2015   CLINICAL DATA:  Multiple recent falls.  Initial evaluation.  EXAM: LUMBAR SPINE - COMPLETE 4+ VIEW  COMPARISON:  CT 03/15/2015 .  FINDINGS: Lumbar scoliosis. Multilevel diffuse severe degenerative changes are present. Diffuse osteopenia. T12 compression fracture, age undetermined, noted sacral fractures better demonstrated by prior CT of 03/15/2015.  IMPRESSION: 1. T12 compression fracture, age undetermined. 2. Sacral fractures better demonstrated by prior CT of 03/15/2015. 3. Diffuse severe degenerative change.   Electronically Signed   By: [place]   On: 03/28/2015 09:03   Ct Head Wo Contrast  03/28/2015   CLINICAL DATA:  Pain following fall.  Alzheimer's dementia  EXAM: CT HEAD WITHOUT CONTRAST  CT MAXILLOFACIAL WITHOUT CONTRAST  CT CERVICAL SPINE WITHOUT CONTRAST  TECHNIQUE: Multidetector CT imaging of the head, cervical spine, and maxillofacial structures were performed using the standard protocol without intravenous contrast. Multiplanar CT image reconstructions of the cervical spine and maxillofacial structures were also generated.  COMPARISON:  Head CT March 26, 2015; cervical spine CT March 14, 2015  FINDINGS: CT HEAD FINDINGS  There is moderate diffuse atrophy. There is no intracranial mass, hemorrhage, extra-axial fluid collection, or midline shift. There is evidence of a prior infarct in the mid cerebellum on the left, stable. There is evidence of a prior infarct in the medial right occipital lobe, stable. There is patchy small vessel disease in the centra semiovale bilaterally, stable. There is no new gray-white compartment lesion. No acute infarct apparent. Bony calvarium appears intact. The mastoid air cells are clear.  CT MAXILLOFACIAL FINDINGS  There is an old  fracture of the distal nasal bone with remodeling. No acute fracture is apparent on this study. No dislocation. The orbits appear symmetric and normal bilaterally. Paranasal sinuses are clear. Ostiomeatal unit complexes are patent bilaterally. There is a concha bullosa on the right, an anatomic variant. There is mild rightward deviation of the nasal septum. Salivary glands appear symmetric and normal bilaterally. No adenopathy appreciable. There is degenerative change in each temporomandibular joint, more severe on the left than on the right.  CT CERVICAL SPINE FINDINGS  The recent nondisplaced fractures of the right C5 lamina and C5 spinous process remain without change. No new fracture is apparent. There is no spondylolisthesis. Prevertebral soft tissues and predental space regions are normal. There is moderately severe disc space narrowing at C5-6 and C6-7. There is milder narrowing at C3-4, C4-5, and C7-T1, stable. There is facet hypertrophy at essentially all levels bilaterally. No disc extrusion or stenosis.  IMPRESSION: CT head: Atrophy with prior infarcts and periventricular small vessel disease. No intracranial mass, hemorrhage, or extra-axial fluid collection. No acute infarct apparent.  CT maxillofacial: Old healed nasal fracture. No acute fracture or dislocation. Paranasal sinuses clear. Ostiomeatal unit complexes patent. No intraorbital lesions. Mild nasal septal deviation.  CT cervical spine: Stable nondisplaced fractures of the right C5 lamina and C5 spinous process. No new fracture. Multilevel arthropathy, stable. No spondylolisthesis.  These results were called by telephone at the time of interpretation on 03/28/2015 at 8:58 am to Dr. Dorie Rank , who verbally acknowledged these results.   Electronically Signed   By: Lowella Grip III M.D.   On: 03/28/2015 08:58   Ct Head Wo Contrast  03/27/2015   CLINICAL DATA:  Unwitnessed fall, found down on nursing home floor. Dementia. History of prostate  cancer and Alzheimer's.  EXAM: CT HEAD WITHOUT CONTRAST  TECHNIQUE: Contiguous axial images were obtained from the base of the skull through the vertex without intravenous contrast.  COMPARISON:  CT of head and cervical spine March 14, 2015  FINDINGS: Motion degraded examination; patient scanned multiple times with minimal image quality improvement.  The ventricles and sulci are normal for age. No intraparenchymal hemorrhage, mass effect nor midline shift. Patchy supratentorial white matter hypodensities are within normal range for patient's age and though non-specific suggest sequelae of chronic small vessel ischemic disease. No acute large vascular territory infarcts. Remote LEFT cerebellar infarct was present previously. RIGHT mesial occipital lobe encephalomalacia again noted.  No abnormal extra-axial fluid collections. Basal cisterns are patent. Moderate calcific atherosclerosis of the carotid siphons.  No skull fracture. The included ocular globes and orbital contents are non-suspicious. The mastoid aircells and included paranasal sinuses are well-aerated.  IMPRESSION: No definite acute intracranial process though motion degrades sensitivity.  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 At least moderate white matter changes suggest chronic small vessel ischemic disease.   Electronically Signed   By: Elon Alas   On: 03/27/2015 00:11   Ct Cervical Spine Wo Contrast  03/28/2015   CLINICAL DATA:  Pain following fall.  Alzheimer's dementia  EXAM: CT HEAD WITHOUT CONTRAST  CT MAXILLOFACIAL WITHOUT CONTRAST  CT CERVICAL SPINE WITHOUT CONTRAST  TECHNIQUE: Multidetector CT imaging of the head, cervical spine, and maxillofacial structures were performed using the standard protocol without intravenous contrast. Multiplanar CT image reconstructions of the cervical spine and maxillofacial structures were also generated.  COMPARISON:  Head CT March 26, 2015; cervical  spine CT March 14, 2015  FINDINGS: CT HEAD FINDINGS  There is moderate diffuse atrophy. There is no intracranial mass, hemorrhage, extra-axial fluid collection, or midline shift. There is evidence of a prior infarct in the mid cerebellum on the left, stable. There is evidence of a prior infarct in the medial right occipital lobe, stable. There is patchy small vessel disease in the centra semiovale bilaterally, stable. There is no new gray-white compartment lesion. No acute infarct apparent. Bony calvarium appears intact. The mastoid air cells are clear.  CT MAXILLOFACIAL FINDINGS  There is an old fracture of the distal nasal bone with remodeling. No acute fracture is apparent on this study. No dislocation. The orbits appear symmetric and normal bilaterally. Paranasal sinuses are clear. Ostiomeatal unit complexes are patent bilaterally. There is a concha bullosa on the right, an anatomic variant. There is mild rightward deviation of the nasal septum. Salivary glands appear symmetric and normal bilaterally. No adenopathy appreciable. There is degenerative change in each temporomandibular joint, more severe on the left than on the right.  CT CERVICAL SPINE FINDINGS  The recent nondisplaced fractures of the right C5 lamina and C5 spinous process remain without change. No new fracture is apparent. There is no spondylolisthesis. Prevertebral soft tissues and predental space regions are normal. There is moderately severe disc space narrowing at C5-6 and C6-7. There is milder narrowing at C3-4, C4-5, and C7-T1, stable. There is facet hypertrophy at essentially all levels bilaterally. No disc extrusion or stenosis.  IMPRESSION: CT head: Atrophy with prior infarcts and periventricular small vessel disease. No intracranial mass, hemorrhage, or extra-axial fluid collection. No acute infarct apparent.  CT maxillofacial: Old healed nasal fracture. No acute fracture or dislocation. Paranasal sinuses clear. Ostiomeatal unit  complexes patent. No intraorbital lesions. Mild nasal septal deviation.  CT cervical spine: Stable nondisplaced fractures of the right C5 lamina and C5 spinous process. No new fracture. Multilevel arthropathy, stable. No spondylolisthesis.  These results were called by telephone at the time of interpretation on 03/28/2015 at 8:58 am to Dr. Dorie Rank , who verbally acknowledged these results.   Electronically Signed   By: Lowella Grip III M.D.   On: 03/28/2015 08:58   Ct Maxillofacial Wo Cm  03/28/2015   CLINICAL DATA:  Pain following fall.  Alzheimer's dementia  EXAM: CT HEAD WITHOUT CONTRAST  CT MAXILLOFACIAL WITHOUT CONTRAST  CT CERVICAL SPINE WITHOUT CONTRAST  TECHNIQUE: Multidetector CT imaging of the head, cervical spine, and maxillofacial structures were performed using the standard protocol without intravenous contrast. Multiplanar CT image reconstructions of the cervical spine and maxillofacial structures were also generated.  COMPARISON:  Head CT March 26, 2015; cervical spine CT March 14, 2015  FINDINGS: CT HEAD FINDINGS  There is moderate diffuse atrophy. There is no intracranial mass, hemorrhage, extra-axial fluid collection, or midline shift. There is evidence of a prior infarct in the mid cerebellum on the left, stable. There is evidence of a prior infarct in the medial right occipital lobe, stable. There is patchy small vessel disease in the centra semiovale bilaterally, stable. There is no new gray-white compartment lesion. No acute infarct apparent. Bony calvarium appears intact. The mastoid air cells are clear.  CT MAXILLOFACIAL FINDINGS  There is an old fracture of the distal nasal bone with remodeling. No acute fracture is apparent on this study. No dislocation. The orbits appear symmetric and normal bilaterally. Paranasal sinuses are clear. Ostiomeatal unit complexes are patent bilaterally. There is a concha bullosa on the right, an anatomic variant. There is mild rightward deviation of  the nasal septum. Salivary glands appear symmetric and normal bilaterally. No adenopathy appreciable. There is degenerative change in each temporomandibular joint, more severe on the left than on the right.  CT CERVICAL SPINE FINDINGS  The recent nondisplaced fractures of the right C5 lamina and C5 spinous process remain without change. No new fracture is apparent. There is no spondylolisthesis. Prevertebral soft tissues and predental space regions are normal. There is moderately severe disc space narrowing at C5-6 and C6-7. There is milder narrowing at  C3-4, C4-5, and C7-T1, stable. There is facet hypertrophy at essentially all levels bilaterally. No disc extrusion or stenosis.  IMPRESSION: CT head: Atrophy with prior infarcts and periventricular small vessel disease. No intracranial mass, hemorrhage, or extra-axial fluid collection. No acute infarct apparent.  CT maxillofacial: Old healed nasal fracture. No acute fracture or dislocation. Paranasal sinuses clear. Ostiomeatal unit complexes patent. No intraorbital lesions. Mild nasal septal deviation.  CT cervical spine: Stable nondisplaced fractures of the right C5 lamina and C5 spinous process. No new fracture. Multilevel arthropathy, stable. No spondylolisthesis.  These results were called by telephone at the time of interpretation on 03/28/2015 at 8:58 am to Dr. Dorie Rank , who verbally acknowledged these results.   Electronically Signed   By: Lowella Grip III M.D.   On: 03/28/2015 08:58    MDM   Final diagnoses:  Nasal laceration, initial encounter    Patient's x-rays do not show any acute fractures. The patient does have known compression fractures in the spine, sacral fractures as well as a C5 lamina and C5 spinous process fracture. The patient was discharged home with a cervical spine collar couple weeks ago. Patient did not arrive with a cervical spine collar in place. We contacted the patient's nursing facility and they indicated that his  neurosurgeon discontinue the cervical spine collar because of skin breakdown.  The patient is stable to be discharged back to his nursing facility. I'm concerned about the number of falls that he has been having. I contacted social worker to see if we could possibly get some follow-up to see if the patient needs any additional care or monitoring at his living facility.    Dorie Rank, MD 03/28/15 1004  Dorie Rank, MD 04/11/15 [phone]

## 2015-03-28 NOTE — ED Notes (Addendum)
D/C report relayed to [employer]; Film/video editor at [facility]. Transport service notified to facilitate transport to [facility].

## 2015-03-28 NOTE — Discharge Instructions (Signed)
Facial Laceration ° A facial laceration is a cut on the face. These injuries can be painful and cause bleeding. Lacerations usually heal quickly, but they need special care to reduce scarring. °DIAGNOSIS  °Your health care provider will take a medical history, ask for details about how the injury occurred, and examine the wound to determine how deep the cut is. °TREATMENT  °Some facial lacerations may not require closure. Others may not be able to be closed because of an increased risk of infection. The risk of infection and the chance for successful closure will depend on various factors, including the amount of time since the injury occurred. °The wound may be cleaned to help prevent infection. If closure is appropriate, pain medicines may be given if needed. Your health care provider will use stitches (sutures), wound glue (adhesive), or skin adhesive strips to repair the laceration. These tools bring the skin edges together to allow for faster healing and a better cosmetic outcome. If needed, you may also be given a tetanus shot. °HOME CARE INSTRUCTIONS °· Only take over-the-counter or prescription medicines as directed by your health care provider. °· Follow your health care provider's instructions for wound care. These instructions will vary depending on the technique used for closing the wound. °For Sutures: °· Keep the wound clean and dry.   °· If you were given a bandage (dressing), you should change it at least once a day. Also change the dressing if it becomes wet or dirty, or as directed by your health care provider.   °· Wash the wound with soap and water 2 times a day. Rinse the wound off with water to remove all soap. Pat the wound dry with a clean towel.   °· After cleaning, apply a thin layer of the antibiotic ointment recommended by your health care provider. This will help prevent infection and keep the dressing from sticking.   °· You may shower as usual after the first 24 hours. Do not soak the  wound in water until the sutures are removed.   °· Get your sutures removed as directed by your health care provider. With facial lacerations, sutures should usually be taken out after 4-5 days to avoid stitch marks.   °· Wait a few days after your sutures are removed before applying any makeup. °For Skin Adhesive Strips: °· Keep the wound clean and dry.   °· Do not get the skin adhesive strips wet. You may bathe carefully, using caution to keep the wound dry.   °· If the wound gets wet, pat it dry with a clean towel.   °· Skin adhesive strips will fall off on their own. You may trim the strips as the wound heals. Do not remove skin adhesive strips that are still stuck to the wound. They will fall off in time.   °For Wound Adhesive: °· You may briefly wet your wound in the shower or bath. Do not soak or scrub the wound. Do not swim. Avoid periods of heavy sweating until the skin adhesive has fallen off on its own. After showering or bathing, gently pat the wound dry with a clean towel.   °· Do not apply liquid medicine, cream medicine, ointment medicine, or makeup to your wound while the skin adhesive is in place. This may loosen the film before your wound is healed.   °· If a dressing is placed over the wound, be careful not to apply tape directly over the skin adhesive. This may cause the adhesive to be pulled off before the wound is healed.   °· Avoid   prolonged exposure to sunlight or tanning lamps while the skin adhesive is in place. °· The skin adhesive will usually remain in place for 5-10 days, then naturally fall off the skin. Do not pick at the adhesive film.   °After Healing: °Once the wound has healed, cover the wound with sunscreen during the day for 1 full year. This can help minimize scarring. Exposure to ultraviolet light in the first year will darken the scar. It can take 1-2 years for the scar to lose its redness and to heal completely.  °SEEK IMMEDIATE MEDICAL CARE IF: °· You have redness, pain, or  swelling around the wound.   °· You see a yellowish-white fluid (pus) coming from the wound.   °· You have chills or a fever.   °MAKE SURE YOU: °· Understand these instructions. °· Will watch your condition. °· Will get help right away if you are not doing well or get worse. °Document Released: 12/25/2004 Document Revised: 09/07/2013 Document Reviewed: 06/30/2013 °[employer]® Patient Information ©2015 [employer]. This information is not intended to replace advice given to you by your health care provider. Make sure you discuss any questions you have with your health care provider. °Tissue Adhesive Wound Care °Some cuts, wounds, lacerations, and incisions can be repaired by using tissue adhesive. Tissue adhesive is like glue. It holds the skin together, allowing for faster healing. It forms a strong bond on the skin in about 1 minute and reaches its full strength in about 2 or 3 minutes. The adhesive disappears naturally while the wound is healing. It is important to take proper care of your wound at home while it heals.  °HOME CARE INSTRUCTIONS  °Showers are allowed. Do not soak the area containing the tissue adhesive. Do not take baths, swim, or use hot tubs. Do not use any soaps or ointments on the wound. Certain ointments can weaken the glue. °If a bandage (dressing) has been applied, follow your health care provider's instructions for how often to change the dressing.   °Keep the dressing dry if one has been applied.   °Do not scratch, pick, or rub the adhesive.   °Do not place tape over the adhesive. The adhesive could come off when pulling the tape off.   °Protect the wound from further injury until it is healed.   °Protect the wound from sun and tanning bed exposure while it is healing and for several weeks after healing.   °Only take over-the-counter or prescription medicines as directed by your health care provider.   °Keep all follow-up appointments as directed by your health care provider. °SEEK  IMMEDIATE MEDICAL CARE IF:  °Your wound becomes red, swollen, hot, or tender.   °You develop a rash after the glue is applied. °You have increasing pain in the wound.   °You have a red streak that goes away from the wound.   °You have pus coming from the wound.   °You have increased bleeding. °You have a fever. °You have shaking chills.   °You notice a bad smell coming from the wound.   °Your wound or adhesive breaks open.   °MAKE SURE YOU:  °Understand these instructions. °Will watch your condition. °Will get help right away if you are not doing well or get worse. °Document Released: 05/13/2001 Document Revised: 09/07/2013 Document Reviewed: 06/08/2013 °Exit[employer]® Patient Information ©2015 [employer]. This information is not intended to replace advice given to you by your health care provider. Make sure you discuss any questions you have with your health care provider. ° °

## 2015-03-28 NOTE — ED Notes (Signed)
Bed: WA20 Expected date: 03/28/15 Expected time: 7:05 AM Means of arrival: Ambulance Comments: [place]

## 2015-03-28 NOTE — Progress Notes (Addendum)
CSW received consult regarding patient with multiple falls and multiple ed visits related. Per chart review, patient is a resident at [facility]. Per facility, patient slid out of the couch in the living room. Per caregiver, patient fell asleep in the couch and just slipped out. CSW left message with resident care coordiantor, Hermina Staggers. Her contact information is [phone].  CSW attempted to assess patient at bedside, however currently in a test.    Belia Heman, [place] Work  [employer] [phone]  Addendum: Per facility, they plan to talk with family further about increasing pt level of care.   Belia Heman, [place] Work  [employer] [phone]

## 2015-03-28 NOTE — ED Notes (Signed)
Fall, found on ground, c/o back pain. No grimacing upon palpation by EMS

## 2015-03-28 NOTE — ED Notes (Signed)
MD at bedside. 

## 2015-03-30 ENCOUNTER — Emergency Department (HOSPITAL_COMMUNITY): Payer: Medicare Other

## 2015-03-30 ENCOUNTER — Encounter (HOSPITAL_COMMUNITY): Payer: Self-pay | Admitting: Emergency Medicine

## 2015-03-30 ENCOUNTER — Emergency Department (HOSPITAL_COMMUNITY)
Admission: EM | Admit: 2015-03-30 | Discharge: 2015-03-31 | Disposition: A | Discharge status: Home / Self Care | Payer: Medicare Other | Attending: Emergency Medicine | Admitting: Emergency Medicine

## 2015-03-30 DIAGNOSIS — G309 Alzheimer's disease, unspecified: Secondary | ICD-10-CM | POA: Diagnosis not present

## 2015-03-30 DIAGNOSIS — M109 Gout, unspecified: Secondary | ICD-10-CM | POA: Insufficient documentation

## 2015-03-30 DIAGNOSIS — Z79899 Other long term (current) drug therapy: Secondary | ICD-10-CM | POA: Insufficient documentation

## 2015-03-30 DIAGNOSIS — M1990 Unspecified osteoarthritis, unspecified site: Secondary | ICD-10-CM | POA: Diagnosis not present

## 2015-03-30 DIAGNOSIS — W19XXXA Unspecified fall, initial encounter: Secondary | ICD-10-CM | POA: Diagnosis not present

## 2015-03-30 DIAGNOSIS — Z8639 Personal history of other endocrine, nutritional and metabolic disease: Secondary | ICD-10-CM | POA: Diagnosis not present

## 2015-03-30 DIAGNOSIS — Y998 Other external cause status: Secondary | ICD-10-CM | POA: Insufficient documentation

## 2015-03-30 DIAGNOSIS — I10 Essential (primary) hypertension: Secondary | ICD-10-CM | POA: Insufficient documentation

## 2015-03-30 DIAGNOSIS — S0083XA Contusion of other part of head, initial encounter: Secondary | ICD-10-CM | POA: Insufficient documentation

## 2015-03-30 DIAGNOSIS — F0280 Dementia in other diseases classified elsewhere without behavioral disturbance: Secondary | ICD-10-CM | POA: Diagnosis not present

## 2015-03-30 DIAGNOSIS — Z872 Personal history of diseases of the skin and subcutaneous tissue: Secondary | ICD-10-CM | POA: Diagnosis not present

## 2015-03-30 DIAGNOSIS — Z8546 Personal history of malignant neoplasm of prostate: Secondary | ICD-10-CM | POA: Insufficient documentation

## 2015-03-30 DIAGNOSIS — S0993XA Unspecified injury of face, initial encounter: Secondary | ICD-10-CM | POA: Diagnosis present

## 2015-03-30 DIAGNOSIS — Y92128 Other place in nursing home as the place of occurrence of the external cause: Secondary | ICD-10-CM | POA: Diagnosis not present

## 2015-03-30 DIAGNOSIS — Y9389 Activity, other specified: Secondary | ICD-10-CM | POA: Insufficient documentation

## 2015-03-30 LAB — I-STAT CHEM 8, ED
BUN: 38 mg/dL — AB (ref 6–23)
CALCIUM ION: 1.2 mmol/L (ref 1.13–1.30)
Chloride: 108 mmol/L (ref 96–112)
Creatinine, Ser: 1 mg/dL (ref 0.50–1.35)
Glucose, Bld: 94 mg/dL (ref 70–99)
HEMATOCRIT: 29 % — AB (ref 39.0–52.0)
HEMOGLOBIN: 9.9 g/dL — AB (ref 13.0–17.0)
Potassium: 4.2 mmol/L (ref 3.5–5.1)
SODIUM: 144 mmol/L (ref 135–145)
TCO2: 24 mmol/L (ref 0–100)

## 2015-03-30 NOTE — ED Notes (Signed)
Pt transported from [facility] after being found in floor in another residents room on the floor. Pt had another recent fall with C5 fracture, bruising and abrasions noted to face per EMS these are from previous fall. Pt confused, yelling attempting to pull off collar.

## 2015-03-30 NOTE — ED Provider Notes (Signed)
CSN: [id number]     Arrival date & time 03/30/15  2108 History   First MD Initiated Contact with Patient 03/30/15 2329     Chief Complaint  Patient presents with  . Fall     (Consider location/radiation/quality/duration/timing/severity/associated sxs/prior Treatment) HPI William Ramos is a 81 y.o. male with past medical history of Alzheimer's, hypertension, hyperlipidemia presenting today after a fall. Patient is multiple complaints for falls. He is unable to provide his own history. Per nursing and EMS notes patient was found on the floor by another resident. He recently had a C5 fracture. He also has bruising in his face that was seen during his last fall and did not show any acute injuries at that time. Patient is able to tell me his name but cannot tell me if he is in pain anywhere.   Past Medical History  Diagnosis Date  . Hypertension   . Gout   . Hypercholesteremia   . Arthritis   . Acid reflux   . Cancer     Cancer-prostate  . Edema   . Psoriasis   . Alzheimer disease    Past Surgical History  Procedure Laterality Date  . Cataract extraction w/phaco  07/01/2012    Procedure: CATARACT EXTRACTION PHACO AND INTRAOCULAR LENS PLACEMENT (IOC);  Surgeon: Tonny Branch, MD;  Location: [facility];  Service: Ophthalmology;  Laterality: Right;  CDE 19.91  . Cataract extraction w/phaco  09/02/2012    Procedure: CATARACT EXTRACTION PHACO AND INTRAOCULAR LENS PLACEMENT (IOC);  Surgeon: Tonny Branch, MD;  Location: [facility];  Service: Ophthalmology;  Laterality: Left;  CDE 16.23  . Tonsillectomy    . Dilatation of esophageal stricture     Family History  Problem Relation Age of Onset  . Heart attack Mother   . Stroke Father    History  Substance Use Topics  . Smoking status: Never Smoker   . Smokeless tobacco: Never Used  . Alcohol Use: Yes     Comment: 2 glasses of wine daily    Review of Systems  Unable to perform ROS: Dementia      Allergies  Review of patient's allergies  indicates no known allergies.  Home Medications   Prior to Admission medications   Medication Sig Start Date End Date Taking? Authorizing Provider  donepezil (ARICEPT) 10 MG tablet Take 10 mg by mouth daily with breakfast.   Yes Historical Provider, MD  ENSURE (ENSURE) Take 237 mLs by mouth daily with breakfast.   Yes Historical Provider, MD  etodolac (LODINE) 400 MG tablet Take 400 mg by mouth daily with breakfast.    Yes Historical Provider, MD  furosemide (LASIX) 20 MG tablet Take 20 mg by mouth daily with breakfast.    Yes Historical Provider, MD  lisinopril (PRINIVIL,ZESTRIL) 20 MG tablet Take 20 mg by mouth daily after breakfast.    Yes Historical Provider, MD  LORazepam (ATIVAN) 0.5 MG tablet Take 0.5 mg by mouth daily.    Yes Historical Provider, MD  Multiple Vitamins-Minerals (ELDERTONIC PO) Take 15 mLs by mouth 3 (three) times daily.   Yes Historical Provider, MD  Multiple Vitamins-Minerals (I-VITE) TABS Take 1 tablet by mouth daily with breakfast.   Yes Historical Provider, MD  Nutritional Supplements (NUTRITIONAL SHAKE) LIQD Take 1 Can by mouth 3 (three) times daily. *Great Shakes*   Yes Historical Provider, MD  omeprazole (PRILOSEC) 20 MG capsule Take 20 mg by mouth daily with breakfast.    Yes Historical Provider, MD  probenecid (BENEMID) 500 MG  tablet Take 500 mg by mouth daily with breakfast.    Yes Historical Provider, MD  risperiDONE (RISPERDAL) 0.25 MG tablet Take 1 tablet (0.25 mg total) by mouth 2 (two) times daily. 01/02/15  Yes Megan Spero Curb, NP  acetaminophen (TYLENOL) 325 MG tablet Take 650 mg by mouth every 6 (six) hours as needed for moderate pain (pain).     Historical Provider, MD  chlorproMAZINE (THORAZINE) 25 MG tablet Take 25 mg by mouth every 6 (six) hours as needed (for hiccups).    Historical Provider, MD  loperamide (IMODIUM) 2 MG capsule Take 2 mg by mouth every 4 (four) hours as needed for diarrhea or loose stools (diarrhea).     Historical Provider, MD   LORazepam (ATIVAN) 0.5 MG tablet TAKE 1 TABLET BY MOUTH EVERY EIGHT HOURS AS NEEDED FOR ANXIETY. Patient taking differently: TAKE 1 TABLET BY MOUTH EVERY six HOURS AS NEEDED FOR ANXIETY 12/04/14   Asencion Partridge Dohmeier, MD   BP 111/75 mmHg  Pulse 149  Temp(Src) 98 F (36.7 C) (Axillary)  Resp 21  SpO2 98% Physical Exam  Constitutional: Vital signs are normal. He appears well-developed and well-nourished.  Non-toxic appearance. He does not appear ill. No distress.  HENT:  Head: Normocephalic.  Nose: Nose normal.  Mouth/Throat: Oropharynx is clear and moist. No oropharyngeal exudate.  Multiple areas of bruising throughout his face. All appears old.  Eyes: Conjunctivae and EOM are normal. Pupils are equal, round, and reactive to light. No scleral icterus.  Neck: Normal range of motion. Neck supple. No tracheal deviation, no edema, no erythema and normal range of motion present. No thyroid mass and no thyromegaly present.  c-collar in place.  Cardiovascular: Normal rate, regular rhythm, S1 normal, S2 normal, normal heart sounds, intact distal pulses and normal pulses.  Exam reveals no gallop and no friction rub.   No murmur heard. Pulses:      Radial pulses are 2+ on the right side, and 2+ on the left side.       Dorsalis pedis pulses are 2+ on the right side, and 2+ on the left side.  Pulmonary/Chest: Effort normal and breath sounds normal. No respiratory distress. He has no wheezes. He has no rhonchi. He has no rales.  Abdominal: Soft. Normal appearance and bowel sounds are normal. He exhibits no distension, no ascites and no mass. There is no hepatosplenomegaly. There is no tenderness. There is no rebound, no guarding and no CVA tenderness.  Musculoskeletal: Normal range of motion. He exhibits no edema or tenderness.  Lymphadenopathy:    He has no cervical adenopathy.  Neurological: He is alert. He has normal strength. No sensory deficit.  Intermittently follows commands. Was seen moving all 4  extremities. Patient does respond to pain.  Skin: Skin is warm, dry and intact. No petechiae and no rash noted. He is not diaphoretic. No erythema. No pallor.  Nursing note and vitals reviewed.   ED Course  Procedures (including critical care time) Labs Review Labs Reviewed  CBC WITH DIFFERENTIAL/PLATELET - Abnormal; Notable for the following:    RBC 3.07 (*)    Hemoglobin 9.5 (*)    HCT 30.8 (*)    MCV 100.3 (*)    Neutrophils Relative % 83 (*)    Lymphocytes Relative 9 (*)    All other components within normal limits  URINALYSIS, ROUTINE W REFLEX MICROSCOPIC - Abnormal; Notable for the following:    Bilirubin Urine SMALL (*)    All other components within normal limits  I-STAT CHEM 8, ED - Abnormal; Notable for the following:    BUN 38 (*)    Hemoglobin 9.9 (*)    HCT 29.0 (*)    All other components within normal limits    Imaging Review Dg Chest 2 View  03/31/2015   CLINICAL DATA:  Dementia, confusion, agitation. Found down on floor, unwitnessed fall. Recent C5 fracture. Facial bruising from prior fall.  EXAM: CHEST  2 VIEW  COMPARISON:  Chest radiograph January 09, 2015  FINDINGS: Heart size is mildly enlarged, mediastinal silhouette is unremarkable. Low inspiratory portable examination without pleural effusion or focal consolidation. Subcentimeter nodular density projecting in RIGHT upper lobe. No pneumothorax.  Progressed approximate T12 compression moderate to severe. Approximate T 6 moderate compression fractures unchanged.  IMPRESSION: Mild cardiomegaly, no acute pulmonary process.  Subcentimeter nodular density RIGHT lung apex would be better characterized on CT of the chest as clinically indicated.  Progressed approximate T12, moderate to severe compression fracture.   Electronically Signed   By: Elon Alas   On: 03/31/2015 00:38   Ct Head Wo Contrast  03/31/2015   CLINICAL DATA:  Found on floor. Unwitnessed fall. Confusion. Concern for cervical spine injury.  EXAM:  CT HEAD WITHOUT CONTRAST  CT CERVICAL SPINE WITHOUT CONTRAST  TECHNIQUE: Multidetector CT imaging of the head and cervical spine was performed following the standard protocol without intravenous contrast. Multiplanar CT image reconstructions of the cervical spine were also generated.  COMPARISON:  CT of the head and cervical spine performed 03/28/2015  FINDINGS: CT HEAD FINDINGS  There is no evidence of acute infarction, mass lesion, or intra- or extra-axial hemorrhage on CT.  Prominence of the ventricles and sulci reflects moderate cortical volume loss. Cerebellar atrophy is noted. Scattered periventricular and subcortical white matter change likely reflects small vessel ischemic microangiopathy.  The brainstem and fourth ventricle are within normal limits. The basal ganglia are unremarkable in appearance. The cerebral hemispheres demonstrate grossly normal gray-white differentiation. No mass effect or midline shift is seen.  There is no evidence of fracture; visualized osseous structures are unremarkable in appearance. The visualized portions of the orbits are within normal limits. The paranasal sinuses and mastoid air cells are well-aerated. No significant soft tissue abnormalities are seen.  CT CERVICAL SPINE FINDINGS  The fracture of the right C5 lamina and spinous process is grossly unchanged in appearance. No new fractures are seen. There is minimal grade 1 anterolisthesis of C2 on C3, of C3 on C4 and of C4 on C5. There is also minimal grade 1 anterolisthesis of C7 on T1. Underlying multilevel disc space narrowing is noted, with scattered small anterior and posterior disc osteophyte complexes. Facet disease is noted along the upper cervical spine.  The visualized portions of the thyroid gland are unremarkable in appearance. The visualized lung apices are clear. No significant soft tissue abnormalities are seen.  IMPRESSION: 1. No evidence of acute traumatic intracranial injury or fracture. 2. Fracture of the  right C5 lamina and spinous process is grossly unchanged in appearance from prior studies. 3. Moderate cortical volume loss and scattered small vessel ischemic microangiopathy. 4. Mild diffuse degenerative change along the cervical spine.   Electronically Signed   By: Garald Balding M.D.   On: 03/31/2015 01:54   Ct Cervical Spine Wo Contrast  03/31/2015   CLINICAL DATA:  Found on floor. Unwitnessed fall. Confusion. Concern for cervical spine injury.  EXAM: CT HEAD WITHOUT CONTRAST  CT CERVICAL SPINE WITHOUT CONTRAST  TECHNIQUE: Multidetector CT imaging  of the head and cervical spine was performed following the standard protocol without intravenous contrast. Multiplanar CT image reconstructions of the cervical spine were also generated.  COMPARISON:  CT of the head and cervical spine performed 03/28/2015  FINDINGS: CT HEAD FINDINGS  There is no evidence of acute infarction, mass lesion, or intra- or extra-axial hemorrhage on CT.  Prominence of the ventricles and sulci reflects moderate cortical volume loss. Cerebellar atrophy is noted. Scattered periventricular and subcortical white matter change likely reflects small vessel ischemic microangiopathy.  The brainstem and fourth ventricle are within normal limits. The basal ganglia are unremarkable in appearance. The cerebral hemispheres demonstrate grossly normal gray-white differentiation. No mass effect or midline shift is seen.  There is no evidence of fracture; visualized osseous structures are unremarkable in appearance. The visualized portions of the orbits are within normal limits. The paranasal sinuses and mastoid air cells are well-aerated. No significant soft tissue abnormalities are seen.  CT CERVICAL SPINE FINDINGS  The fracture of the right C5 lamina and spinous process is grossly unchanged in appearance. No new fractures are seen. There is minimal grade 1 anterolisthesis of C2 on C3, of C3 on C4 and of C4 on C5. There is also minimal grade 1  anterolisthesis of C7 on T1. Underlying multilevel disc space narrowing is noted, with scattered small anterior and posterior disc osteophyte complexes. Facet disease is noted along the upper cervical spine.  The visualized portions of the thyroid gland are unremarkable in appearance. The visualized lung apices are clear. No significant soft tissue abnormalities are seen.  IMPRESSION: 1. No evidence of acute traumatic intracranial injury or fracture. 2. Fracture of the right C5 lamina and spinous process is grossly unchanged in appearance from prior studies. 3. Moderate cortical volume loss and scattered small vessel ischemic microangiopathy. 4. Mild diffuse degenerative change along the cervical spine.   Electronically Signed   By: Garald Balding M.D.   On: 03/31/2015 01:54     EKG Interpretation   Date/Time:  Saturday March 31 2015 00:30:14 EDT Ventricular Rate:  61 PR Interval:  233 QRS Duration: 97 QT Interval:  378 QTC Calculation: 381 R Axis:   -14 Text Interpretation:  Sinus rhythm Prolonged PR interval Low voltage,  extremity leads No significant change since last tracing Confirmed by Glynn Octave [phone]) on 03/31/2015 12:48:08 AM      MDM   Final diagnoses:  Fall  Fall  Fall   Patient presents emergency department after suspected fall. He has multiple presentations for this. Will obtain CT scan of head and C-spine to evaluate for any injury. All fascial  Wounds appear old and CT scan from 2 days ago only shows an old nasal bone fracture in the face. Also obtain basic labs and infectious workup to ensure this is not the etiology of his falls.  Laboratory studies unremarkable and at the patient's baseline. CT scan does not show any significant injury. EKG does not show an etiology of his fall. At this time patient's vital signs were within his normal limits, tachycardia has resolved on its own. Patient safe for discharge.   Everlene Balls, MD 03/31/15 [phone]

## 2015-03-30 NOTE — ED Notes (Signed)
Safety sitter at bedside, posey stretcher alarm in place. Pt continues to attempt to get up, yelling out. Pulling at collar and cords

## 2015-03-31 DIAGNOSIS — S0083XA Contusion of other part of head, initial encounter: Secondary | ICD-10-CM | POA: Diagnosis not present

## 2015-03-31 LAB — CBC WITH DIFFERENTIAL/PLATELET
Basophils Absolute: 0 10*3/uL (ref 0.0–0.1)
Basophils Relative: 0 % (ref 0–1)
EOS ABS: 0 10*3/uL (ref 0.0–0.7)
Eosinophils Relative: 0 % (ref 0–5)
HCT: 30.8 % — ABNORMAL LOW (ref 39.0–52.0)
Hemoglobin: 9.5 g/dL — ABNORMAL LOW (ref 13.0–17.0)
LYMPHS ABS: 0.8 10*3/uL (ref 0.7–4.0)
LYMPHS PCT: 9 % — AB (ref 12–46)
MCH: 30.9 pg (ref 26.0–34.0)
MCHC: 30.8 g/dL (ref 30.0–36.0)
MCV: 100.3 fL — ABNORMAL HIGH (ref 78.0–100.0)
MONO ABS: 0.8 10*3/uL (ref 0.1–1.0)
Monocytes Relative: 8 % (ref 3–12)
NEUTROS PCT: 83 % — AB (ref 43–77)
Neutro Abs: 7.6 10*3/uL (ref 1.7–7.7)
Platelets: 274 10*3/uL (ref 150–400)
RBC: 3.07 MIL/uL — ABNORMAL LOW (ref 4.22–5.81)
RDW: 13.9 % (ref 11.5–15.5)
WBC: 9.2 10*3/uL (ref 4.0–10.5)

## 2015-03-31 LAB — URINALYSIS, ROUTINE W REFLEX MICROSCOPIC
GLUCOSE, UA: NEGATIVE mg/dL
HGB URINE DIPSTICK: NEGATIVE
KETONES UR: NEGATIVE mg/dL
Leukocytes, UA: NEGATIVE
Nitrite: NEGATIVE
PROTEIN: NEGATIVE mg/dL
SPECIFIC GRAVITY, URINE: 1.018 (ref 1.005–1.030)
UROBILINOGEN UA: 0.2 mg/dL (ref 0.0–1.0)
pH: 7 (ref 5.0–8.0)

## 2015-03-31 NOTE — Discharge Instructions (Signed)
Fall Prevention and Home Safety Mr. Dugdale, Your chest xray results are below.  You need a follow up CT scan in 6 months to look at a possible mass in your lungs.  Follow-up with orthopedic surgery regarding the compression fracture in your back. The CT scan of your head did not show injury.  Return to emergency department for any worsening. Thank you.  IMPRESSION: Mild cardiomegaly, no acute pulmonary process.  Subcentimeter nodular density RIGHT lung apex would be better characterized on CT of the chest as clinically indicated.  Progressed approximate T12, moderate to severe compression fracture. Falls cause injuries and can affect all age groups. It is possible to prevent falls.  HOW TO PREVENT FALLS  Wear shoes with rubber soles that do not have an opening for your toes.  Keep the inside and outside of your house well lit.  Use night lights throughout your home.  Remove clutter from floors.  Clean up floor spills.  Remove throw rugs or fasten them to the floor with carpet tape.  Do not place electrical cords across pathways.  Put grab bars by your tub, shower, and toilet. Do not use towel bars as grab bars.  Put handrails on both sides of the stairway. Fix loose handrails.  Do not climb on stools or stepladders, if possible.  Do not wax your floors.  Repair uneven or unsafe sidewalks, walkways, or stairs.  Keep items you use a lot within reach.  Be aware of pets.  Keep emergency numbers next to the telephone.  Put smoke detectors in your home and near bedrooms. Ask your doctor what other things you can do to prevent falls. Document Released: 09/13/2009 Document Revised: 05/18/2012 Document Reviewed: 02/17/2012 [facility] Patient Information 2015 [place], [place]. This information is not intended to replace advice given to you by your health care provider. Make sure you discuss any questions you have with your health care provider.

## 2015-03-31 NOTE — ED Notes (Signed)
[facility] called and made aware of need to transport patient back to facility

## 2015-04-02 ENCOUNTER — Emergency Department (HOSPITAL_COMMUNITY)
Admission: EM | Admit: 2015-04-02 | Discharge: 2015-04-02 | Disposition: A | Discharge status: Home / Self Care | Payer: Medicare Other | Attending: Emergency Medicine | Admitting: Emergency Medicine

## 2015-04-02 ENCOUNTER — Emergency Department (HOSPITAL_COMMUNITY): Payer: Medicare Other

## 2015-04-02 ENCOUNTER — Encounter (HOSPITAL_COMMUNITY): Payer: Self-pay | Admitting: *Deleted

## 2015-04-02 DIAGNOSIS — Z872 Personal history of diseases of the skin and subcutaneous tissue: Secondary | ICD-10-CM | POA: Diagnosis not present

## 2015-04-02 DIAGNOSIS — I10 Essential (primary) hypertension: Secondary | ICD-10-CM | POA: Diagnosis not present

## 2015-04-02 DIAGNOSIS — F0280 Dementia in other diseases classified elsewhere without behavioral disturbance: Secondary | ICD-10-CM | POA: Diagnosis not present

## 2015-04-02 DIAGNOSIS — Z79899 Other long term (current) drug therapy: Secondary | ICD-10-CM | POA: Diagnosis not present

## 2015-04-02 DIAGNOSIS — S50311D Abrasion of right elbow, subsequent encounter: Secondary | ICD-10-CM | POA: Diagnosis not present

## 2015-04-02 DIAGNOSIS — Z8546 Personal history of malignant neoplasm of prostate: Secondary | ICD-10-CM | POA: Diagnosis not present

## 2015-04-02 DIAGNOSIS — W1839XD Other fall on same level, subsequent encounter: Secondary | ICD-10-CM | POA: Diagnosis not present

## 2015-04-02 DIAGNOSIS — M1990 Unspecified osteoarthritis, unspecified site: Secondary | ICD-10-CM | POA: Diagnosis not present

## 2015-04-02 DIAGNOSIS — E780 Pure hypercholesterolemia: Secondary | ICD-10-CM | POA: Diagnosis not present

## 2015-04-02 DIAGNOSIS — G309 Alzheimer's disease, unspecified: Secondary | ICD-10-CM | POA: Diagnosis not present

## 2015-04-02 DIAGNOSIS — Y92129 Unspecified place in nursing home as the place of occurrence of the external cause: Secondary | ICD-10-CM

## 2015-04-02 DIAGNOSIS — W19XXXD Unspecified fall, subsequent encounter: Secondary | ICD-10-CM

## 2015-04-02 DIAGNOSIS — S0081XD Abrasion of other part of head, subsequent encounter: Secondary | ICD-10-CM | POA: Diagnosis not present

## 2015-04-02 DIAGNOSIS — S0990XD Unspecified injury of head, subsequent encounter: Secondary | ICD-10-CM | POA: Diagnosis present

## 2015-04-02 DIAGNOSIS — S0081XA Abrasion of other part of head, initial encounter: Secondary | ICD-10-CM

## 2015-04-02 DIAGNOSIS — K219 Gastro-esophageal reflux disease without esophagitis: Secondary | ICD-10-CM | POA: Insufficient documentation

## 2015-04-02 NOTE — ED Notes (Signed)
Bed: [facility] Expected date:  Expected time:  Means of arrival:  Comments: 

## 2015-04-02 NOTE — Discharge Instructions (Signed)
No acute renal injury was noted on today's CAT scan.  He has the cervical spine fracture noted from before.  We have placed him in a more comfortable collar.  If he does not need to wear this, please include documentation with him when he returns to the emergency department for future falls.    Fall Prevention in Hospitals As a hospital patient, your condition and the treatments you receive can increase your risk for falls. Some additional risk factors for falls in a hospital include:  Being in an unfamiliar environment.  Being on bed rest.  Your surgery.  Taking certain medicines.  Your tubing requirements, such as intravenous (IV) therapy or catheters. It is important that you learn how to decrease fall risks while at the hospital. Below are important tips that can help prevent falls. SAFETY TIPS FOR PREVENTING FALLS Talk about your risk of falling.  Ask your caregiver why you are at risk for falling. Is it your medicine, illness, tubing placement, or something else?  Make a plan with your caregiver to keep you safe from falls.  Ask your caregiver or pharmacist about side effect of your medicines. Some medicines can make you dizzy or affect your coordination. Ask for help.  Ask for help before getting out of bed. You may need to press your call button.  Ask for assistance in getting you safely to the toilet.  Ask for a walker or cane to be put at your bedside. Ask that most of the side rails on your bed be placed up before your caregiver leaves the room.  Ask family or friends to sit with you.  Ask for things that are out of your reach, such as your glasses, hearing aids, telephone, bedside table, or call button. Follow these tips to avoid falling:  Stay lying or seated, rather than standing, while waiting for help.  Wear rubber-soled slippers or shoes whenever you walk in the hospital.  Avoid quick, sudden movements.  Change positions slowly.  Sit on the side of your  bed before standing.  Stand up slowly and wait before you start to walk.  Let your caregiver know if there is a spill on the floor.  Pay careful attention to the medical equipment, electrical cords, and tubes around you.  When you need help, use your call button by your bed or in the bathroom. Wait for one of your caregivers to help you.  If you feel dizzy or unsure of your footing, return to bed and wait for assistance.  Avoid being distracted by the TV, telephone, or another person in your room.  Do not lean or support yourself on rolling objects, such as IV poles or bedside tables. Document Released: 11/14/2000 Document Revised: 11/03/2012 Document Reviewed: 07/25/2012 [facility] Patient Information 2015 [place], [place]. This information is not intended to replace advice given to you by your health care provider. Make sure you discuss any questions you have with your health care provider.  Abrasion An abrasion is a cut or scrape of the skin. Abrasions do not extend through all layers of the skin and most heal within 10 days. It is important to care for your abrasion properly to prevent infection. CAUSES  Most abrasions are caused by falling on, or gliding across, the ground or other surface. When your skin rubs on something, the outer and inner layer of skin rubs off, causing an abrasion. DIAGNOSIS  Your caregiver will be able to diagnose an abrasion during a physical exam.  TREATMENT  Your treatment depends on how large and deep the abrasion is. Generally, your abrasion will be cleaned with water and a mild soap to remove any dirt or debris. An antibiotic ointment may be put over the abrasion to prevent an infection. A bandage (dressing) may be wrapped around the abrasion to keep it from getting dirty.  You may need a tetanus shot if:  You cannot remember when you had your last tetanus shot.  You have never had a tetanus shot.  The injury broke your skin. If you get a tetanus shot,  your arm may swell, get red, and feel warm to the touch. This is common and not a problem. If you need a tetanus shot and you choose not to have one, there is a rare chance of getting tetanus. Sickness from tetanus can be serious.  HOME CARE INSTRUCTIONS   If a dressing was applied, change it at least once a day or as directed by your caregiver. If the bandage sticks, soak it off with warm water.   Wash the area with water and a mild soap to remove all the ointment 2 times a day. Rinse off the soap and pat the area dry with a clean towel.   Reapply any ointment as directed by your caregiver. This will help prevent infection and keep the bandage from sticking. Use gauze over the wound and under the dressing to help keep the bandage from sticking.   Change your dressing right away if it becomes wet or dirty.   Only take over-the-counter or prescription medicines for pain, discomfort, or fever as directed by your caregiver.   Follow up with your caregiver within 24-48 hours for a wound check, or as directed. If you were not given a wound-check appointment, look closely at your abrasion for redness, swelling, or pus. These are signs of infection. SEEK IMMEDIATE MEDICAL CARE IF:   You have increasing pain in the wound.   You have redness, swelling, or tenderness around the wound.   You have pus coming from the wound.   You have a fever or persistent symptoms for more than 2-3 days.  You have a fever and your symptoms suddenly get worse.  You have a bad smell coming from the wound or dressing.  MAKE SURE YOU:   Understand these instructions.  Will watch your condition.  Will get help right away if you are not doing well or get worse. Document Released: 08/27/2005 Document Revised: 11/03/2012 Document Reviewed: 10/21/2011 [facility] Patient Information 2015 [place], [place]. This information is not intended to replace advice given to you by your health care provider. Make sure you  discuss any questions you have with your health care provider.

## 2015-04-02 NOTE — ED Notes (Signed)
Pt in CT.

## 2015-04-02 NOTE — ED Provider Notes (Signed)
CSN: [id number]     Arrival date & time 04/02/15  0136 History   First MD Initiated Contact with Patient 04/02/15 [phone]     Chief Complaint  Patient presents with  . Fall     (Consider location/radiation/quality/duration/timing/severity/associated sxs/prior Treatment) HPI 81 year old male presents to the emergency department from his nursing facility via EMS after a fall.  Patient was found on the floor.  He has abrasion to forehead and right elbow.  This is the patient's 14th visit to the emergency department in the last 6 months, all for falls.  Patient has history of dementia.  He has known history of compression fractures and right C5 lamina and spinous process.  Patient was seen and had CAT scans on April 30 for similar fall.  Patient cannot give history due to Alzheimer's.  Patient presents in an Aspen collar that is ill fitting. Past Medical History  Diagnosis Date  . Hypertension   . Gout   . Hypercholesteremia   . Arthritis   . Acid reflux   . Cancer     Cancer-prostate  . Edema   . Psoriasis   . Alzheimer disease    Past Surgical History  Procedure Laterality Date  . Cataract extraction w/phaco  07/01/2012    Procedure: CATARACT EXTRACTION PHACO AND INTRAOCULAR LENS PLACEMENT (IOC);  Surgeon: Tonny Branch, MD;  Location: [facility];  Service: Ophthalmology;  Laterality: Right;  CDE 19.91  . Cataract extraction w/phaco  09/02/2012    Procedure: CATARACT EXTRACTION PHACO AND INTRAOCULAR LENS PLACEMENT (IOC);  Surgeon: Tonny Branch, MD;  Location: [facility];  Service: Ophthalmology;  Laterality: Left;  CDE 16.23  . Tonsillectomy    . Dilatation of esophageal stricture     Family History  Problem Relation Age of Onset  . Heart attack Mother   . Stroke Father    History  Substance Use Topics  . Smoking status: Never Smoker   . Smokeless tobacco: Never Used  . Alcohol Use: Yes     Comment: 2 glasses of wine daily    Review of Systems Level V caveat, dementia   Allergies   Review of patient's allergies indicates no known allergies.  Home Medications   Prior to Admission medications   Medication Sig Start Date End Date Taking? Authorizing Provider  acetaminophen (TYLENOL) 325 MG tablet Take 650 mg by mouth every 6 (six) hours as needed for moderate pain (pain).     Historical Provider, MD  chlorproMAZINE (THORAZINE) 25 MG tablet Take 25 mg by mouth every 6 (six) hours as needed (for hiccups).    Historical Provider, MD  donepezil (ARICEPT) 10 MG tablet Take 10 mg by mouth daily with breakfast.    Historical Provider, MD  ENSURE (ENSURE) Take 237 mLs by mouth daily with breakfast.    Historical Provider, MD  etodolac (LODINE) 400 MG tablet Take 400 mg by mouth daily with breakfast.     Historical Provider, MD  furosemide (LASIX) 20 MG tablet Take 20 mg by mouth daily with breakfast.     Historical Provider, MD  lisinopril (PRINIVIL,ZESTRIL) 20 MG tablet Take 20 mg by mouth daily after breakfast.     Historical Provider, MD  loperamide (IMODIUM) 2 MG capsule Take 2 mg by mouth every 4 (four) hours as needed for diarrhea or loose stools (diarrhea).     Historical Provider, MD  LORazepam (ATIVAN) 0.5 MG tablet TAKE 1 TABLET BY MOUTH EVERY EIGHT HOURS AS NEEDED FOR ANXIETY. Patient taking differently: TAKE 1  TABLET BY MOUTH EVERY six HOURS AS NEEDED FOR ANXIETY 12/04/14   Asencion Partridge Dohmeier, MD  LORazepam (ATIVAN) 0.5 MG tablet Take 0.5 mg by mouth daily.     Historical Provider, MD  Multiple Vitamins-Minerals (ELDERTONIC PO) Take 15 mLs by mouth 3 (three) times daily.    Historical Provider, MD  Multiple Vitamins-Minerals (I-VITE) TABS Take 1 tablet by mouth daily with breakfast.    Historical Provider, MD  Nutritional Supplements (NUTRITIONAL SHAKE) LIQD Take 1 Can by mouth 3 (three) times daily. *Great Shakes*    Historical Provider, MD  omeprazole (PRILOSEC) 20 MG capsule Take 20 mg by mouth daily with breakfast.     Historical Provider, MD  probenecid (BENEMID) 500 MG  tablet Take 500 mg by mouth daily with breakfast.     Historical Provider, MD  risperiDONE (RISPERDAL) 0.25 MG tablet Take 1 tablet (0.25 mg total) by mouth 2 (two) times daily. 01/02/15   Megan P Millikan, NP   BP 136/96 mmHg  Temp(Src) 98.4 F (36.9 C) (Oral)  Resp 18  SpO2 100% Physical Exam  Constitutional: He appears well-developed and well-nourished.  HENT:  Head: Normocephalic and atraumatic.  Nose: Nose normal.  Mouth/Throat: Oropharynx is clear and moist.  Patient has large abrasion to left forehead  Eyes: Conjunctivae and EOM are normal. Pupils are equal, round, and reactive to light.  Neck: Normal range of motion. Neck supple. No JVD present. No tracheal deviation present. No thyromegaly present.  Cardiovascular: Normal rate, regular rhythm, normal heart sounds and intact distal pulses.  Exam reveals no gallop and no friction rub.   No murmur heard. Pulmonary/Chest: Effort normal and breath sounds normal. No stridor. No respiratory distress. He has no wheezes. He has no rales. He exhibits no tenderness.  Abdominal: Soft. Bowel sounds are normal. He exhibits no distension and no mass. There is no tenderness. There is no rebound and no guarding.  Musculoskeletal: Normal range of motion. He exhibits no edema or tenderness.  Patient has has small abrasion right elbow  Lymphadenopathy:    He has no cervical adenopathy.  Neurological: He is alert. He displays normal reflexes. He exhibits normal muscle tone. Coordination normal.  Skin: Skin is warm and dry. No rash noted. No erythema. No pallor.  Psychiatric: He has a normal mood and affect. His behavior is normal. Judgment and thought content normal.  Nursing note and vitals reviewed.   ED Course  Procedures (including critical care time) Labs Review Labs Reviewed - No data to display  Imaging Review Ct Head Wo Contrast  04/02/2015   CLINICAL DATA:  Frequent falls.  Found on floor tonight.  EXAM: CT HEAD WITHOUT CONTRAST  CT  CERVICAL SPINE WITHOUT CONTRAST  TECHNIQUE: Multidetector CT imaging of the head and cervical spine was performed following the standard protocol without intravenous contrast. Multiplanar CT image reconstructions of the cervical spine were also generated. The patient was unable to remain motionless for the scan. Multiple repeated attempts scanning the cervical spine failed to produce motion-free images.  COMPARISON:  03/31/2015  FINDINGS: CT HEAD FINDINGS  There is no intracranial hemorrhage or extra-axial fluid collection. There is moderate generalized atrophy and hemispheric white matter hypodensity which likely represents chronic small vessel disease. There is encephalomalacia due to prior right occipital infarction. These findings are all unchanged from 03/31/2015. Calvarium and skullbase appear intact. The brain images are mildly degraded by motion artifact.  CT CERVICAL SPINE FINDINGS  The cervical spine images are severely degraded by motion artifact. The  patient was agitated during the scan and could not remain motionless. Consequently, even the known C5 spinous process and laminar fractures are not visible on this examination. No obvious acute fracture is evident.  IMPRESSION: *Negative for acute intracranial traumatic injury. *Chronic atrophy and small vessel disease throughout the brain. 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 *The cervical spine CT is essentially nondiagnostic due to severe motion artifact.   Electronically Signed   By: Andreas Newport M.D.   On: 04/02/2015 02:47   Ct Cervical Spine Wo Contrast  04/02/2015   CLINICAL DATA:  Frequent falls.  Found on floor tonight.  EXAM: CT HEAD WITHOUT CONTRAST  CT CERVICAL SPINE WITHOUT CONTRAST  TECHNIQUE: Multidetector CT imaging of the head and cervical spine was performed following the standard protocol without intravenous contrast. Multiplanar CT image reconstructions of the cervical spine were also generated. The patient was unable to remain  motionless for the scan. Multiple repeated attempts scanning the cervical spine failed to produce motion-free images.  COMPARISON:  03/31/2015  FINDINGS: CT HEAD FINDINGS  There is no intracranial hemorrhage or extra-axial fluid collection. There is moderate generalized atrophy and hemispheric white matter hypodensity which likely represents chronic small vessel disease. There is encephalomalacia due to prior right occipital infarction. These findings are all unchanged from 03/31/2015. Calvarium and skullbase appear intact. The brain images are mildly degraded by motion artifact.  CT CERVICAL SPINE FINDINGS  The cervical spine images are severely degraded by motion artifact. The patient was agitated during the scan and could not remain motionless. Consequently, even the known C5 spinous process and laminar fractures are not visible on this examination. No obvious acute fracture is evident.  IMPRESSION: *Negative for acute intracranial traumatic injury. *Chronic atrophy and small vessel disease throughout the brain. 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 *The cervical spine CT is essentially nondiagnostic due to severe motion artifact.   Electronically Signed   By: Andreas Newport M.D.   On: 04/02/2015 02:47     EKG Interpretation None      MDM   Final diagnoses:  Fall at nursing home, subsequent encounter  Forehead abrasion, initial encounter    81 year old male with another fall at his nursing facility.  Clinical social work has been involved in the past regarding his multiple visits for falls.  He has history of C5 fracture.  Given new trauma to the head, and patient with severe dementia who cannot reliability clear Nexus, plan for CT again.    Linton Flemings, MD 04/02/15 [phone]

## 2015-04-02 NOTE — ED Notes (Signed)
EMS called to [facility].  Patient found on floor in hall.  Patient has a chronic issue with falls.

## 2015-04-09 ENCOUNTER — Emergency Department (HOSPITAL_COMMUNITY)
Admission: EM | Admit: 2015-04-09 | Discharge: 2015-04-09 | Disposition: A | Discharge status: Home / Self Care | Payer: Medicare Other | Attending: Emergency Medicine | Admitting: Emergency Medicine

## 2015-04-09 ENCOUNTER — Encounter (HOSPITAL_COMMUNITY): Payer: Self-pay | Admitting: Emergency Medicine

## 2015-04-09 ENCOUNTER — Emergency Department (HOSPITAL_COMMUNITY): Payer: Medicare Other

## 2015-04-09 DIAGNOSIS — S0181XA Laceration without foreign body of other part of head, initial encounter: Secondary | ICD-10-CM

## 2015-04-09 DIAGNOSIS — Z8546 Personal history of malignant neoplasm of prostate: Secondary | ICD-10-CM | POA: Insufficient documentation

## 2015-04-09 DIAGNOSIS — G309 Alzheimer's disease, unspecified: Secondary | ICD-10-CM | POA: Diagnosis not present

## 2015-04-09 DIAGNOSIS — Y929 Unspecified place or not applicable: Secondary | ICD-10-CM | POA: Diagnosis not present

## 2015-04-09 DIAGNOSIS — M109 Gout, unspecified: Secondary | ICD-10-CM | POA: Insufficient documentation

## 2015-04-09 DIAGNOSIS — Z872 Personal history of diseases of the skin and subcutaneous tissue: Secondary | ICD-10-CM | POA: Diagnosis not present

## 2015-04-09 DIAGNOSIS — F0280 Dementia in other diseases classified elsewhere without behavioral disturbance: Secondary | ICD-10-CM | POA: Insufficient documentation

## 2015-04-09 DIAGNOSIS — T148 Other injury of unspecified body region: Secondary | ICD-10-CM | POA: Insufficient documentation

## 2015-04-09 DIAGNOSIS — K219 Gastro-esophageal reflux disease without esophagitis: Secondary | ICD-10-CM | POA: Insufficient documentation

## 2015-04-09 DIAGNOSIS — M1990 Unspecified osteoarthritis, unspecified site: Secondary | ICD-10-CM | POA: Diagnosis not present

## 2015-04-09 DIAGNOSIS — W19XXXA Unspecified fall, initial encounter: Secondary | ICD-10-CM | POA: Diagnosis not present

## 2015-04-09 DIAGNOSIS — I10 Essential (primary) hypertension: Secondary | ICD-10-CM | POA: Diagnosis not present

## 2015-04-09 DIAGNOSIS — Y998 Other external cause status: Secondary | ICD-10-CM | POA: Insufficient documentation

## 2015-04-09 DIAGNOSIS — Z791 Long term (current) use of non-steroidal anti-inflammatories (NSAID): Secondary | ICD-10-CM | POA: Diagnosis not present

## 2015-04-09 DIAGNOSIS — Z79899 Other long term (current) drug therapy: Secondary | ICD-10-CM | POA: Diagnosis not present

## 2015-04-09 DIAGNOSIS — Y939 Activity, unspecified: Secondary | ICD-10-CM | POA: Insufficient documentation

## 2015-04-09 LAB — URINALYSIS, ROUTINE W REFLEX MICROSCOPIC
Glucose, UA: NEGATIVE mg/dL
Hgb urine dipstick: NEGATIVE
Ketones, ur: NEGATIVE mg/dL
Leukocytes, UA: NEGATIVE
Nitrite: NEGATIVE
Protein, ur: NEGATIVE mg/dL
Specific Gravity, Urine: 1.021 (ref 1.005–1.030)
Urobilinogen, UA: 0.2 mg/dL (ref 0.0–1.0)
pH: 5 (ref 5.0–8.0)

## 2015-04-09 MED ORDER — LIDOCAINE-EPINEPHRINE 1 %-1:100000 IJ SOLN
10.0000 mL | Freq: Once | INTRAMUSCULAR | Status: AC
  Administered 2015-04-09: 10 mL
  Filled 2015-04-09: qty 1

## 2015-04-09 NOTE — ED Provider Notes (Signed)
CSN: [id number]     Arrival date & time 04/09/15  1448 History   First MD Initiated Contact with Patient 04/09/15 1501     Chief Complaint  Patient presents with  . Fall  . Facial Laceration     (Consider location/radiation/quality/duration/timing/severity/associated sxs/prior Treatment) HPI   87yM presenting after fall. Pt is poor historian and cannot provide me with specifics of how/why he fell. Unwitnessed. Pt c/o facial pain. Evidence of acute facial trauma. Abrasions/lac to L side of face. Denies pain anywhere else. No n/v. Denies acute visual changes.  No blood thinners per med list.   Past Medical History  Diagnosis Date  . Hypertension   . Gout   . Hypercholesteremia   . Arthritis   . Acid reflux   . Cancer     Cancer-prostate  . Edema   . Psoriasis   . Alzheimer disease    Past Surgical History  Procedure Laterality Date  . Cataract extraction w/phaco  07/01/2012    Procedure: CATARACT EXTRACTION PHACO AND INTRAOCULAR LENS PLACEMENT (IOC);  Surgeon: Tonny Branch, MD;  Location: [facility];  Service: Ophthalmology;  Laterality: Right;  CDE 19.91  . Cataract extraction w/phaco  09/02/2012    Procedure: CATARACT EXTRACTION PHACO AND INTRAOCULAR LENS PLACEMENT (IOC);  Surgeon: Tonny Branch, MD;  Location: [facility];  Service: Ophthalmology;  Laterality: Left;  CDE 16.23  . Tonsillectomy    . Dilatation of esophageal stricture     Family History  Problem Relation Age of Onset  . Heart attack Mother   . Stroke Father    History  Substance Use Topics  . Smoking status: Never Smoker   . Smokeless tobacco: Never Used  . Alcohol Use: Yes     Comment: 2 glasses of wine daily    Review of Systems  Level 5 caveat because pt has dementia.    Allergies  Review of patient's allergies indicates no known allergies.  Home Medications   Prior to Admission medications   Medication Sig Start Date End Date Taking? Authorizing Provider  acetaminophen (TYLENOL) 325 MG tablet Take 650  mg by mouth every 6 (six) hours as needed for moderate pain (pain).    Yes Historical Provider, MD  chlorproMAZINE (THORAZINE) 25 MG tablet Take 25 mg by mouth every 6 (six) hours as needed (for hiccups).   Yes Historical Provider, MD  donepezil (ARICEPT) 10 MG tablet Take 10 mg by mouth daily with breakfast.   Yes Historical Provider, MD  ENSURE (ENSURE) Take 237 mLs by mouth daily with breakfast.   Yes Historical Provider, MD  etodolac (LODINE) 400 MG tablet Take 400 mg by mouth daily with breakfast.    Yes Historical Provider, MD  furosemide (LASIX) 20 MG tablet Take 20 mg by mouth daily with breakfast.    Yes Historical Provider, MD  lisinopril (PRINIVIL,ZESTRIL) 20 MG tablet Take 20 mg by mouth daily after breakfast.    Yes Historical Provider, MD  loperamide (IMODIUM) 2 MG capsule Take 2 mg by mouth every 4 (four) hours as needed for diarrhea or loose stools (diarrhea).    Yes Historical Provider, MD  LORazepam (ATIVAN) 0.5 MG tablet TAKE 1 TABLET BY MOUTH EVERY EIGHT HOURS AS NEEDED FOR ANXIETY. Patient taking differently: TAKE 1 TABLET BY MOUTH EVERY six HOURS AS NEEDED FOR ANXIETY 12/04/14  Yes Asencion Partridge Dohmeier, MD  LORazepam (ATIVAN) 0.5 MG tablet Take 0.5 mg by mouth daily.    Yes Historical Provider, MD  Multiple Vitamins-Minerals (ELDERTONIC PO) Take  15 mLs by mouth 3 (three) times daily.   Yes Historical Provider, MD  Multiple Vitamins-Minerals (I-VITE) TABS Take 1 tablet by mouth daily with breakfast.   Yes Historical Provider, MD  omeprazole (PRILOSEC) 20 MG capsule Take 20 mg by mouth daily with breakfast.    Yes Historical Provider, MD  probenecid (BENEMID) 500 MG tablet Take 500 mg by mouth daily with breakfast.    Yes Historical Provider, MD  risperiDONE (RISPERDAL) 0.25 MG tablet Take 1 tablet (0.25 mg total) by mouth 2 (two) times daily. 01/02/15  Yes Megan P Millikan, NP   BP 109/81 mmHg  Pulse 63  Temp(Src) 98 F (36.7 C)  Resp 16  Ht 5\' 7"  (1.702 m)  Wt 170 lb (77.111 kg)   BMI 26.62 kg/m2  SpO2 99% Physical Exam  Constitutional: He appears well-developed and well-nourished. No distress.  HENT:  Head: Normocephalic.    Abrasions/laceration to forehead and L face in depicted area. Minimal bleeding.   Eyes: Conjunctivae are normal. Right eye exhibits no discharge. Left eye exhibits no discharge.  Neck: Neck supple.  Cardiovascular: Normal rate, regular rhythm and normal heart sounds.  Exam reveals no gallop and no friction rub.   No murmur heard. Pulmonary/Chest: Effort normal and breath sounds normal. No respiratory distress.  Abdominal: Soft. He exhibits no distension. There is no tenderness.  Musculoskeletal: He exhibits no edema or tenderness.  Scattered abrasions and ecchymosis to all extremities which appear to be subacute. No significant bony tenderness of extremities or apparent pain with ROM or large joints. No midline spinal tenderness.   Neurological: He is alert.  Pt oriented to self only. Follows basic commands. No CN deficit. Strength normal in b/l u/l extremities.   Skin: Skin is warm and dry.  Nursing note and vitals reviewed.   ED Course  Procedures (including critical care time)  LACERATION REPAIR Performed by: Virgel Manifold Authorized by: Virgel Manifold Consent: Verbal consent obtained. Risks and benefits: risks, benefits and alternatives were discussed Consent given by: patient Patient identity confirmed: provided demographic data Prepped and Draped in normal sterile fashion Wound explored  Laceration Location: L face  Laceration Length: 5 cm  No Foreign Bodies seen or palpated  Anesthesia: local infiltration  Local anesthetic: lidocaine 1% w epinephrine  Anesthetic total: 3 ml  Irrigation method: syringe Amount of cleaning: standard  Skin closure: 5-0 prolene  Number of sutures: 6  Technique: simple interupted  Patient tolerance: Patient tolerated the procedure well with no immediate complications.  Labs  Review Labs Reviewed  URINALYSIS, ROUTINE W REFLEX MICROSCOPIC - Abnormal; Notable for the following:    Bilirubin Urine MODERATE (*)    All other components within normal limits    Imaging Review Ct Head Wo Contrast  04/09/2015   CLINICAL DATA:  Facial laceration, multiple abrasions and confusion following a fall at a senior living facility around 1400 hours today. Previous C5 fracture.  EXAM: CT HEAD WITHOUT CONTRAST  CT CERVICAL SPINE WITHOUT CONTRAST  TECHNIQUE: Multidetector CT imaging of the head and cervical spine was performed following the standard protocol without intravenous contrast. Multiplanar CT image reconstructions of the cervical spine were also generated.  COMPARISON:  04/02/2015 and 03/30/2015.  FINDINGS: CT HEAD FINDINGS  Diffusely enlarged ventricles and subarachnoid spaces. Patchy white matter low density in both cerebral hemispheres. 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 Mild left frontal scalp hematoma and soft tissue irregularity. No skull fracture, intracranial hemorrhage or paranasal sinus air-fluid levels.  CT CERVICAL SPINE FINDINGS  Multilevel degenerative changes. The previously demonstrated sensate nondisplaced right C5 lamina fracture is unchanged. No prevertebral soft tissue swelling, acute fractures or subluxations.  IMPRESSION: 1. No skull fracture or intracranial hemorrhage. 2. 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 3. Stable nondisplaced right C5 lamina fracture with no acute fractures or subluxations. 4. Multilevel cervical spine degenerative changes.   Electronically Signed   By: Claudie Revering M.D.   On: 04/09/2015 16:05   Ct Cervical Spine Wo Contrast  04/09/2015   CLINICAL DATA:  Facial laceration, multiple abrasions and confusion following a fall at a senior living facility around 1400 hours today. Previous C5 fracture.  EXAM: CT HEAD WITHOUT CONTRAST  CT CERVICAL SPINE WITHOUT CONTRAST   TECHNIQUE: Multidetector CT imaging of the head and cervical spine was performed following the standard protocol without intravenous contrast. Multiplanar CT image reconstructions of the cervical spine were also generated.  COMPARISON:  04/02/2015 and 03/30/2015.  FINDINGS: CT HEAD FINDINGS  Diffusely enlarged ventricles and subarachnoid spaces. Patchy white matter low density in both cerebral hemispheres. 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 Mild left frontal scalp hematoma and soft tissue irregularity. No skull fracture, intracranial hemorrhage or paranasal sinus air-fluid levels.  CT CERVICAL SPINE FINDINGS  Multilevel degenerative changes. The previously demonstrated sensate nondisplaced right C5 lamina fracture is unchanged. No prevertebral soft tissue swelling, acute fractures or subluxations.  IMPRESSION: 1. No skull fracture or intracranial hemorrhage. 2. 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 3. Stable nondisplaced right C5 lamina fracture with no acute fractures or subluxations. 4. Multilevel cervical spine degenerative changes.   Electronically Signed   By: Claudie Revering M.D.   On: 04/09/2015 16:05     EKG Interpretation None       EKG:  Rhythm: sinus bradycardia Vent. rate 48 BPM PR interval 215 ms QRS duration 100 ms QT/QTc 447/399 ms ST segments: NS ST changes   MDM   Final diagnoses:  Fall, initial encounter  Facial laceration, initial encounter    87yM presenting after fall. Hx of dementia. No reported acute change in mental status. Imaging neg. Nonfocal neuro exam aside from mental status. Bradycardia on EKG but has been noted on previous evaluations. BP fine. Afebrile. Nontoxic. Suspect noncontributory.  Irregular lacerations/abrasions to L periorbital region with macerated wound margins. Re-approximated as able. Continued wound care. DC back to facility.     Virgel Manifold, MD 04/09/15  [phone]

## 2015-04-09 NOTE — ED Notes (Signed)
Dr Wilson Singer at bedside to suture laceration

## 2015-04-09 NOTE — ED Notes (Signed)
Bed: WA01 Expected date:  Expected time:  Means of arrival:  Comments: EMS- 81yo, Fall

## 2015-04-09 NOTE — ED Notes (Addendum)
Pt presents to ED via EMS from [facility]. Unwitnessed fall around 1400 and facility heard pt yelling from room. Pt has facial laceration that was bandaged up by EMS. EMS also placed in C-collar for precaution but does state pt has past hx of C-5 fracture. Pt in NAD at this time. No LOC, no blood thinners

## 2015-04-09 NOTE — ED Notes (Signed)
Pt placed on bed alarm as he continues to scream and attempt to get out of bed.

## 2015-04-09 NOTE — Discharge Instructions (Signed)
Facial Laceration ° A facial laceration is a cut on the face. These injuries can be painful and cause bleeding. Lacerations usually heal quickly, but they need special care to reduce scarring. °DIAGNOSIS  °Your health care provider will take a medical history, ask for details about how the injury occurred, and examine the wound to determine how deep the cut is. °TREATMENT  °Some facial lacerations may not require closure. Others may not be able to be closed because of an increased risk of infection. The risk of infection and the chance for successful closure will depend on various factors, including the amount of time since the injury occurred. °The wound may be cleaned to help prevent infection. If closure is appropriate, pain medicines may be given if needed. Your health care provider will use stitches (sutures), wound glue (adhesive), or skin adhesive strips to repair the laceration. These tools bring the skin edges together to allow for faster healing and a better cosmetic outcome. If needed, you may also be given a tetanus shot. °HOME CARE INSTRUCTIONS °· Only take over-the-counter or prescription medicines as directed by your health care provider. °· Follow your health care provider's instructions for wound care. These instructions will vary depending on the technique used for closing the wound. °For Sutures: °· Keep the wound clean and dry.   °· If you were given a bandage (dressing), you should change it at least once a day. Also change the dressing if it becomes wet or dirty, or as directed by your health care provider.   °· Wash the wound with soap and water 2 times a day. Rinse the wound off with water to remove all soap. Pat the wound dry with a clean towel.   °· After cleaning, apply a thin layer of the antibiotic ointment recommended by your health care provider. This will help prevent infection and keep the dressing from sticking.   °· You may shower as usual after the first 24 hours. Do not soak the  wound in water until the sutures are removed.   °· Get your sutures removed as directed by your health care provider. With facial lacerations, sutures should usually be taken out after 4-5 days to avoid stitch marks.   °· Wait a few days after your sutures are removed before applying any makeup. °For Skin Adhesive Strips: °· Keep the wound clean and dry.   °· Do not get the skin adhesive strips wet. You may bathe carefully, using caution to keep the wound dry.   °· If the wound gets wet, pat it dry with a clean towel.   °· Skin adhesive strips will fall off on their own. You may trim the strips as the wound heals. Do not remove skin adhesive strips that are still stuck to the wound. They will fall off in time.   °For Wound Adhesive: °· You may briefly wet your wound in the shower or bath. Do not soak or scrub the wound. Do not swim. Avoid periods of heavy sweating until the skin adhesive has fallen off on its own. After showering or bathing, gently pat the wound dry with a clean towel.   °· Do not apply liquid medicine, cream medicine, ointment medicine, or makeup to your wound while the skin adhesive is in place. This may loosen the film before your wound is healed.   °· If a dressing is placed over the wound, be careful not to apply tape directly over the skin adhesive. This may cause the adhesive to be pulled off before the wound is healed.   °· Avoid   prolonged exposure to sunlight or tanning lamps while the skin adhesive is in place.  The skin adhesive will usually remain in place for 5-10 days, then naturally fall off the skin. Do not pick at the adhesive film.  After Healing: Once the wound has healed, cover the wound with sunscreen during the day for 1 full year. This can help minimize scarring. Exposure to ultraviolet light in the first year will darken the scar. It can take 1-2 years for the scar to lose its redness and to heal completely.  SEEK IMMEDIATE MEDICAL CARE IF:  You have redness, pain, or  swelling around the wound.   You see ayellowish-white fluid (pus) coming from the wound.   You have chills or a fever.  MAKE SURE YOU:  Understand these instructions.  Will watch your condition.  Will get help right away if you are not doing well or get worse. Document Released: 12/25/2004 Document Revised: 09/07/2013 Document Reviewed: 06/30/2013 [facility] Patient Information 2015 [place], [place]. This information is not intended to replace advice given to you by your health care provider. Make sure you discuss any questions you have with your health care provider.  Fall Prevention and Home Safety Falls cause injuries and can affect all age groups. It is possible to prevent falls.  HOW TO PREVENT FALLS  Wear shoes with rubber soles that do not have an opening for your toes.  Keep the inside and outside of your house well lit.  Use night lights throughout your home.  Remove clutter from floors.  Clean up floor spills.  Remove throw rugs or fasten them to the floor with carpet tape.  Do not place electrical cords across pathways.  Put grab bars by your tub, shower, and toilet. Do not use towel bars as grab bars.  Put handrails on both sides of the stairway. Fix loose handrails.  Do not climb on stools or stepladders, if possible.  Do not wax your floors.  Repair uneven or unsafe sidewalks, walkways, or stairs.  Keep items you use a lot within reach.  Be aware of pets.  Keep emergency numbers next to the telephone.  Put smoke detectors in your home and near bedrooms. Ask your doctor what other things you can do to prevent falls. Document Released: 09/13/2009 Document Revised: 05/18/2012 Document Reviewed: 02/17/2012 [facility] Patient Information 2015 [place], [place]. This information is not intended to replace advice given to you by your health care provider. Make sure you discuss any questions you have with your health care provider.

## 2015-04-12 ENCOUNTER — Emergency Department (HOSPITAL_COMMUNITY): Payer: Medicare Other

## 2015-04-12 ENCOUNTER — Encounter (HOSPITAL_COMMUNITY): Payer: Self-pay | Admitting: Emergency Medicine

## 2015-04-12 ENCOUNTER — Emergency Department (HOSPITAL_COMMUNITY)
Admission: EM | Admit: 2015-04-12 | Discharge: 2015-04-12 | Disposition: A | Discharge status: Home / Self Care | Payer: Medicare Other | Attending: Emergency Medicine | Admitting: Emergency Medicine

## 2015-04-12 DIAGNOSIS — I10 Essential (primary) hypertension: Secondary | ICD-10-CM | POA: Insufficient documentation

## 2015-04-12 DIAGNOSIS — M109 Gout, unspecified: Secondary | ICD-10-CM | POA: Insufficient documentation

## 2015-04-12 DIAGNOSIS — W1830XA Fall on same level, unspecified, initial encounter: Secondary | ICD-10-CM | POA: Diagnosis not present

## 2015-04-12 DIAGNOSIS — T68XXXA Hypothermia, initial encounter: Secondary | ICD-10-CM | POA: Diagnosis not present

## 2015-04-12 DIAGNOSIS — M1990 Unspecified osteoarthritis, unspecified site: Secondary | ICD-10-CM | POA: Insufficient documentation

## 2015-04-12 DIAGNOSIS — Z791 Long term (current) use of non-steroidal anti-inflammatories (NSAID): Secondary | ICD-10-CM | POA: Insufficient documentation

## 2015-04-12 DIAGNOSIS — Z8639 Personal history of other endocrine, nutritional and metabolic disease: Secondary | ICD-10-CM | POA: Insufficient documentation

## 2015-04-12 DIAGNOSIS — Z8546 Personal history of malignant neoplasm of prostate: Secondary | ICD-10-CM | POA: Diagnosis not present

## 2015-04-12 DIAGNOSIS — G309 Alzheimer's disease, unspecified: Secondary | ICD-10-CM | POA: Insufficient documentation

## 2015-04-12 DIAGNOSIS — Z79899 Other long term (current) drug therapy: Secondary | ICD-10-CM | POA: Diagnosis not present

## 2015-04-12 DIAGNOSIS — X58XXXA Exposure to other specified factors, initial encounter: Secondary | ICD-10-CM | POA: Diagnosis not present

## 2015-04-12 DIAGNOSIS — S022XXA Fracture of nasal bones, initial encounter for closed fracture: Secondary | ICD-10-CM | POA: Diagnosis not present

## 2015-04-12 DIAGNOSIS — R001 Bradycardia, unspecified: Secondary | ICD-10-CM | POA: Diagnosis not present

## 2015-04-12 DIAGNOSIS — Y929 Unspecified place or not applicable: Secondary | ICD-10-CM | POA: Diagnosis not present

## 2015-04-12 DIAGNOSIS — S0993XA Unspecified injury of face, initial encounter: Secondary | ICD-10-CM | POA: Diagnosis present

## 2015-04-12 DIAGNOSIS — F0280 Dementia in other diseases classified elsewhere without behavioral disturbance: Secondary | ICD-10-CM | POA: Diagnosis not present

## 2015-04-12 DIAGNOSIS — Y939 Activity, unspecified: Secondary | ICD-10-CM | POA: Insufficient documentation

## 2015-04-12 DIAGNOSIS — Z872 Personal history of diseases of the skin and subcutaneous tissue: Secondary | ICD-10-CM | POA: Insufficient documentation

## 2015-04-12 DIAGNOSIS — Y999 Unspecified external cause status: Secondary | ICD-10-CM | POA: Diagnosis not present

## 2015-04-12 DIAGNOSIS — W19XXXA Unspecified fall, initial encounter: Secondary | ICD-10-CM

## 2015-04-12 DIAGNOSIS — K219 Gastro-esophageal reflux disease without esophagitis: Secondary | ICD-10-CM | POA: Diagnosis not present

## 2015-04-12 DIAGNOSIS — IMO0002 Reserved for concepts with insufficient information to code with codable children: Secondary | ICD-10-CM

## 2015-04-12 DIAGNOSIS — Y92129 Unspecified place in nursing home as the place of occurrence of the external cause: Secondary | ICD-10-CM

## 2015-04-12 LAB — COMPREHENSIVE METABOLIC PANEL
ALT: 37 U/L (ref 17–63)
AST: 38 U/L (ref 15–41)
Albumin: 2.9 g/dL — ABNORMAL LOW (ref 3.5–5.0)
Alkaline Phosphatase: 105 U/L (ref 38–126)
Anion gap: 7 (ref 5–15)
BUN: 24 mg/dL — AB (ref 6–20)
CALCIUM: 8.9 mg/dL (ref 8.9–10.3)
CHLORIDE: 109 mmol/L (ref 101–111)
CO2: 25 mmol/L (ref 22–32)
Creatinine, Ser: 0.99 mg/dL (ref 0.61–1.24)
Glucose, Bld: 93 mg/dL (ref 65–99)
POTASSIUM: 3.7 mmol/L (ref 3.5–5.1)
Sodium: 141 mmol/L (ref 135–145)
Total Bilirubin: 0.5 mg/dL (ref 0.3–1.2)
Total Protein: 5.8 g/dL — ABNORMAL LOW (ref 6.5–8.1)

## 2015-04-12 LAB — CBC WITH DIFFERENTIAL/PLATELET
BASOS ABS: 0 10*3/uL (ref 0.0–0.1)
Basophils Relative: 0 % (ref 0–1)
Eosinophils Absolute: 0.1 10*3/uL (ref 0.0–0.7)
Eosinophils Relative: 1 % (ref 0–5)
HEMATOCRIT: 32.6 % — AB (ref 39.0–52.0)
HEMOGLOBIN: 10.4 g/dL — AB (ref 13.0–17.0)
LYMPHS ABS: 0.7 10*3/uL (ref 0.7–4.0)
LYMPHS PCT: 10 % — AB (ref 12–46)
MCH: 31.5 pg (ref 26.0–34.0)
MCHC: 31.9 g/dL (ref 30.0–36.0)
MCV: 98.8 fL (ref 78.0–100.0)
MONO ABS: 0.6 10*3/uL (ref 0.1–1.0)
Monocytes Relative: 9 % (ref 3–12)
NEUTROS ABS: 5.6 10*3/uL (ref 1.7–7.7)
Neutrophils Relative %: 80 % — ABNORMAL HIGH (ref 43–77)
Platelets: 237 10*3/uL (ref 150–400)
RBC: 3.3 MIL/uL — ABNORMAL LOW (ref 4.22–5.81)
RDW: 14.1 % (ref 11.5–15.5)
WBC: 7.1 10*3/uL (ref 4.0–10.5)

## 2015-04-12 LAB — I-STAT CG4 LACTIC ACID, ED: LACTIC ACID, VENOUS: 1.22 mmol/L (ref 0.5–2.0)

## 2015-04-12 MED ORDER — CEPHALEXIN 500 MG PO CAPS: 500.0000 mg | ORAL_CAPSULE | Freq: Four times a day (QID) | ORAL | Status: DC

## 2015-04-12 NOTE — ED Notes (Signed)
Provider at bedside to remove old sutures around the left eye

## 2015-04-12 NOTE — ED Notes (Signed)
Pt brought to ED by [facility] from [facility] after unwitnessed fall, pt present with multiple lacerations on his face and arm. Pt having multiple hx of falling and on ED every other day for falling.

## 2015-04-12 NOTE — Discharge Instructions (Signed)
Suture removal in 7 days. Antibiotic as prescribed.  Fall Prevention and Home Safety Falls cause injuries and can affect all age groups. It is possible to use preventive measures to significantly decrease the likelihood of falls. There are many simple measures which can make your home safer and prevent falls. OUTDOORS  Repair cracks and edges of walkways and driveways.  Remove high doorway thresholds.  Trim shrubbery on the main path into your home.  Have good outside lighting.  Clear walkways of tools, rocks, debris, and clutter.  Check that handrails are not broken and are securely fastened. Both sides of steps should have handrails.  Have leaves, snow, and ice cleared regularly.  Use sand or salt on walkways during winter months.  In the garage, clean up grease or oil spills. BATHROOM  Install night lights.  Install grab bars by the toilet and in the tub and shower.  Use non-skid mats or decals in the tub or shower.  Place a plastic non-slip stool in the shower to sit on, if needed.  Keep floors dry and clean up all water on the floor immediately.  Remove soap buildup in the tub or shower on a regular basis.  Secure bath mats with non-slip, double-sided rug tape.  Remove throw rugs and tripping hazards from the floors. BEDROOMS  Install night lights.  Make sure a bedside light is easy to reach.  Do not use oversized bedding.  Keep a telephone by your bedside.  Have a firm chair with side arms to use for getting dressed.  Remove throw rugs and tripping hazards from the floor. KITCHEN  Keep handles on pots and pans turned toward the center of the stove. Use back burners when possible.  Clean up spills quickly and allow time for drying.  Avoid walking on wet floors.  Avoid hot utensils and knives.  Position shelves so they are not too high or low.  Place commonly used objects within easy reach.  If necessary, use a sturdy step stool with a grab bar  when reaching.  Keep electrical cables out of the way.  Do not use floor polish or wax that makes floors slippery. If you must use wax, use non-skid floor wax.  Remove throw rugs and tripping hazards from the floor. STAIRWAYS  Never leave objects on stairs.  Place handrails on both sides of stairways and use them. Fix any loose handrails. Make sure handrails on both sides of the stairways are as long as the stairs.  Check carpeting to make sure it is firmly attached along stairs. Make repairs to worn or loose carpet promptly.  Avoid placing throw rugs at the top or bottom of stairways, or properly secure the rug with carpet tape to prevent slippage. Get rid of throw rugs, if possible.  Have an electrician put in a light switch at the top and bottom of the stairs. OTHER FALL PREVENTION TIPS  Wear low-heel or rubber-soled shoes that are supportive and fit well. Wear closed toe shoes.  When using a stepladder, make sure it is fully opened and both spreaders are firmly locked. Do not climb a closed stepladder.  Add color or contrast paint or tape to grab bars and handrails in your home. Place contrasting color strips on first and last steps.  Learn and use mobility aids as needed. Install an electrical emergency response system.  Turn on lights to avoid dark areas. Replace light bulbs that burn out immediately. Get light switches that glow.  Arrange furniture to  create clear pathways. Keep furniture in the same place.  Firmly attach carpet with non-skid or double-sided tape.  Eliminate uneven floor surfaces.  Select a carpet pattern that does not visually hide the edge of steps.  Be aware of all pets. OTHER HOME SAFETY TIPS  Set the water temperature for 120 F (48.8 C).  Keep emergency numbers on or near the telephone.  Keep smoke detectors on every level of the home and near sleeping areas. Document Released: 11/07/2002 Document Revised: 05/18/2012 Document Reviewed:  02/06/2012 [facility] Patient Information 2015 [place], [place]. This information is not intended to replace advice given to you by your health care provider. Make sure you discuss any questions you have with your health care provider.  Laceration Care, Adult A laceration is a cut or lesion that goes through all layers of the skin and into the tissue just beneath the skin. TREATMENT  Some lacerations may not require closure. Some lacerations may not be able to be closed due to an increased risk of infection. It is important to see your caregiver as soon as possible after an injury to minimize the risk of infection and maximize the opportunity for successful closure. If closure is appropriate, pain medicines may be given, if needed. The wound will be cleaned to help prevent infection. Your caregiver will use stitches (sutures), staples, wound glue (adhesive), or skin adhesive strips to repair the laceration. These tools bring the skin edges together to allow for faster healing and a better cosmetic outcome. However, all wounds will heal with a scar. Once the wound has healed, scarring can be minimized by covering the wound with sunscreen during the day for 1 full year. HOME CARE INSTRUCTIONS  For sutures or staples:  Keep the wound clean and dry.  If you were given a bandage (dressing), you should change it at least once a day. Also, change the dressing if it becomes wet or dirty, or as directed by your caregiver.  Wash the wound with soap and water 2 times a day. Rinse the wound off with water to remove all soap. Pat the wound dry with a clean towel.  After cleaning, apply a thin layer of the antibiotic ointment as recommended by your caregiver. This will help prevent infection and keep the dressing from sticking.  You may shower as usual after the first 24 hours. Do not soak the wound in water until the sutures are removed.  Only take over-the-counter or prescription medicines for pain,  discomfort, or fever as directed by your caregiver.  Get your sutures or staples removed as directed by your caregiver. For skin adhesive strips:  Keep the wound clean and dry.  Do not get the skin adhesive strips wet. You may bathe carefully, using caution to keep the wound dry.  If the wound gets wet, pat it dry with a clean towel.  Skin adhesive strips will fall off on their own. You may trim the strips as the wound heals. Do not remove skin adhesive strips that are still stuck to the wound. They will fall off in time. For wound adhesive:  You may briefly wet your wound in the shower or bath. Do not soak or scrub the wound. Do not swim. Avoid periods of heavy perspiration until the skin adhesive has fallen off on its own. After showering or bathing, gently pat the wound dry with a clean towel.  Do not apply liquid medicine, cream medicine, or ointment medicine to your wound while the skin adhesive is  in place. This may loosen the film before your wound is healed.  If a dressing is placed over the wound, be careful not to apply tape directly over the skin adhesive. This may cause the adhesive to be pulled off before the wound is healed.  Avoid prolonged exposure to sunlight or tanning lamps while the skin adhesive is in place. Exposure to ultraviolet light in the first year will darken the scar.  The skin adhesive will usually remain in place for 5 to 10 days, then naturally fall off the skin. Do not pick at the adhesive film. You may need a tetanus shot if:  You cannot remember when you had your last tetanus shot.  You have never had a tetanus shot. If you get a tetanus shot, your arm may swell, get red, and feel warm to the touch. This is common and not a problem. If you need a tetanus shot and you choose not to have one, there is a rare chance of getting tetanus. Sickness from tetanus can be serious. SEEK MEDICAL CARE IF:   You have redness, swelling, or increasing pain in the  wound.  You see a red line that goes away from the wound.  You have yellowish-white fluid (pus) coming from the wound.  You have a fever.  You notice a bad smell coming from the wound or dressing.  Your wound breaks open before or after sutures have been removed.  You notice something coming out of the wound such as wood or glass.  Your wound is on your hand or foot and you cannot move a finger or toe. SEEK IMMEDIATE MEDICAL CARE IF:   Your pain is not controlled with prescribed medicine.  You have severe swelling around the wound causing pain and numbness or a change in color in your arm, hand, leg, or foot.  Your wound splits open and starts bleeding.  You have worsening numbness, weakness, or loss of function of any joint around or beyond the wound.  You develop painful lumps near the wound or on the skin anywhere on your body. MAKE SURE YOU:   Understand these instructions.  Will watch your condition.  Will get help right away if you are not doing well or get worse. Document Released: 11/17/2005 Document Revised: 02/09/2012 Document Reviewed: 05/13/2011 [facility] Patient Information 2015 [place], [place]. This information is not intended to replace advice given to you by your health care provider. Make sure you discuss any questions you have with your health care provider.  Nasal Fracture A nasal fracture is a break or crack in the bones of the nose. A minor break usually heals in a month. You often will receive black eyes from a nasal fracture. This is not a cause for concern. The black eyes will go away over 1 to 2 weeks.  DIAGNOSIS  Your caregiver may want to examine you if you are concerned about a fracture of the nose. X-rays of the nose may not show a nasal fracture even when one is present. Sometimes your caregiver must wait 1 to 5 days after the injury to re-check the nose for alignment and to take additional X-rays. Sometimes the caregiver must wait until the  swelling has gone down. TREATMENT Minor fractures that have caused no deformity often do not require treatment. More serious fractures where bones are displaced may require surgery. This will take place after the swelling is gone. Surgery will stabilize and align the fracture. HOME CARE INSTRUCTIONS   Put ice  on the injured area.  Put ice in a plastic bag.  Place a towel between your skin and the bag.  Leave the ice on for 15-20 minutes, 03-04 times a day.  Take medications as directed by your caregiver.  Only take over-the-counter or prescription medicines for pain, discomfort, or fever as directed by your caregiver.  If your nose starts bleeding, squeeze the soft parts of the nose against the center wall while you are sitting in an upright position for 10 minutes.  Contact sports should be avoided for at least 3 to 4 weeks or as directed by your caregiver. SEEK MEDICAL CARE IF:  Your pain increases or becomes severe.  You continue to have nosebleeds.  The shape of your nose does not return to normal within 5 days.  You have pus draining from the nose. SEEK IMMEDIATE MEDICAL CARE IF:   You have bleeding from your nose that does not stop after 20 minutes of pinching the nostrils closed and keeping ice on the nose.  You have clear fluid draining from your nose.  You notice a grape-like swelling on the dividing wall between the nostrils (septum). This is a collection of blood (hematoma) that must be drained to help prevent infection.  You have difficulty moving your eyes.  You have recurrent vomiting. Document Released: 11/14/2000 Document Revised: 02/09/2012 Document Reviewed: 03/03/2011 [facility] Patient Information 2015 [place], [place]. This information is not intended to replace advice given to you by your health care provider. Make sure you discuss any questions you have with your health care provider.

## 2015-04-12 NOTE — ED Provider Notes (Signed)
CSN: [id number]     Arrival date & time 04/12/15  9476 History   First MD Initiated Contact with Patient 04/12/15 0559     Chief Complaint  Patient presents with  . Fall     (Consider location/radiation/quality/duration/timing/severity/associated sxs/prior Treatment) Patient is a 81 y.o. male presenting with fall. The history is provided by the nursing home. The history is limited by the condition of the patient (Dementia).  Fall  He had an unwitnessed fall at the nursing home where he lives. He is unable to give any history.  Past Medical History  Diagnosis Date  . Hypertension   . Gout   . Hypercholesteremia   . Arthritis   . Acid reflux   . Cancer     Cancer-prostate  . Edema   . Psoriasis   . Alzheimer disease    Past Surgical History  Procedure Laterality Date  . Cataract extraction w/phaco  07/01/2012    Procedure: CATARACT EXTRACTION PHACO AND INTRAOCULAR LENS PLACEMENT (IOC);  Surgeon: Tonny Branch, MD;  Location: [facility];  Service: Ophthalmology;  Laterality: Right;  CDE 19.91  . Cataract extraction w/phaco  09/02/2012    Procedure: CATARACT EXTRACTION PHACO AND INTRAOCULAR LENS PLACEMENT (IOC);  Surgeon: Tonny Branch, MD;  Location: [facility];  Service: Ophthalmology;  Laterality: Left;  CDE 16.23  . Tonsillectomy    . Dilatation of esophageal stricture     Family History  Problem Relation Age of Onset  . Heart attack Mother   . Stroke Father    History  Substance Use Topics  . Smoking status: Never Smoker   . Smokeless tobacco: Never Used  . Alcohol Use: Yes     Comment: 2 glasses of wine daily    Review of Systems  Unable to perform ROS: Dementia      Allergies  Review of patient's allergies indicates no known allergies.  Home Medications   Prior to Admission medications   Medication Sig Start Date End Date Taking? Authorizing Provider  acetaminophen (TYLENOL) 325 MG tablet Take 650 mg by mouth every 6 (six) hours as needed for moderate pain (pain).      Historical Provider, MD  chlorproMAZINE (THORAZINE) 25 MG tablet Take 25 mg by mouth every 6 (six) hours as needed (for hiccups).    Historical Provider, MD  donepezil (ARICEPT) 10 MG tablet Take 10 mg by mouth daily with breakfast.    Historical Provider, MD  ENSURE (ENSURE) Take 237 mLs by mouth daily with breakfast.    Historical Provider, MD  etodolac (LODINE) 400 MG tablet Take 400 mg by mouth daily with breakfast.     Historical Provider, MD  furosemide (LASIX) 20 MG tablet Take 20 mg by mouth daily with breakfast.     Historical Provider, MD  lisinopril (PRINIVIL,ZESTRIL) 20 MG tablet Take 20 mg by mouth daily after breakfast.     Historical Provider, MD  loperamide (IMODIUM) 2 MG capsule Take 2 mg by mouth every 4 (four) hours as needed for diarrhea or loose stools (diarrhea).     Historical Provider, MD  LORazepam (ATIVAN) 0.5 MG tablet TAKE 1 TABLET BY MOUTH EVERY EIGHT HOURS AS NEEDED FOR ANXIETY. Patient taking differently: TAKE 1 TABLET BY MOUTH EVERY six HOURS AS NEEDED FOR ANXIETY 12/04/14   Asencion Partridge Dohmeier, MD  LORazepam (ATIVAN) 0.5 MG tablet Take 0.5 mg by mouth daily.     Historical Provider, MD  Multiple Vitamins-Minerals (ELDERTONIC PO) Take 15 mLs by mouth 3 (three) times daily.  Historical Provider, MD  Multiple Vitamins-Minerals (I-VITE) TABS Take 1 tablet by mouth daily with breakfast.    Historical Provider, MD  omeprazole (PRILOSEC) 20 MG capsule Take 20 mg by mouth daily with breakfast.     Historical Provider, MD  probenecid (BENEMID) 500 MG tablet Take 500 mg by mouth daily with breakfast.     Historical Provider, MD  risperiDONE (RISPERDAL) 0.25 MG tablet Take 1 tablet (0.25 mg total) by mouth 2 (two) times daily. 01/02/15   Ward Givens, NP   BP 119/53 mmHg  Pulse 42  Temp(Src) 96.5 F (35.8 C) (Rectal)  Resp 13  SpO2 100% Physical Exam  Nursing note and vitals reviewed.  81 year old male, resting comfortably and in no acute distress. Vital signs are  significant for bradycardia and hypothermia. Oxygen saturation is 100%, which is normal. Head is normocephalic. There is a laceration on the bridge of the nose, abrasion to the left side of the forehead and abrasion of the left malar area. PERRLA, EOMI. Oropharynx is clear. Neck is immobilized in a stiff c-collar and is nontender. Back is nontender and there is no CVA tenderness. Lungs are clear without rales, wheezes, or rhonchi. Chest is nontender. Heart has regular rate and rhythm without murmur. Abdomen is soft, flat, nontender without masses or hepatosplenomegaly and peristalsis is normoactive. Extremities have no cyanosis or edema, full range of motion is present. Skin tear is present over the left elbow. Skin is warm and dry without rash. Neurologic: He is awake and will answer questions and follow commands. He is oriented to person but not place or time, cranial nerves are intact, there are no motor or sensory deficits.  ED Course  Procedures (including critical care time) Labs Review Results for orders placed or performed during the hospital encounter of 04/12/15  Comprehensive metabolic panel  Result Value Ref Range   Sodium 141 135 - 145 mmol/L   Potassium 3.7 3.5 - 5.1 mmol/L   Chloride 109 101 - 111 mmol/L   CO2 25 22 - 32 mmol/L   Glucose, Bld 93 65 - 99 mg/dL   BUN 24 (H) 6 - 20 mg/dL   Creatinine, Ser 0.99 0.61 - 1.24 mg/dL   Calcium 8.9 8.9 - 10.3 mg/dL   Total Protein 5.8 (L) 6.5 - 8.1 g/dL   Albumin 2.9 (L) 3.5 - 5.0 g/dL   AST 38 15 - 41 U/L   ALT 37 17 - 63 U/L   Alkaline Phosphatase 105 38 - 126 U/L   Total Bilirubin 0.5 0.3 - 1.2 mg/dL   GFR calc non Af Amer >60 >60 mL/min   GFR calc Af Amer >60 >60 mL/min   Anion gap 7 5 - 15  CBC with Differential  Result Value Ref Range   WBC 7.1 4.0 - 10.5 K/uL   RBC 3.30 (L) 4.22 - 5.81 MIL/uL   Hemoglobin 10.4 (L) 13.0 - 17.0 g/dL   HCT 32.6 (L) 39.0 - 52.0 %   MCV 98.8 78.0 - 100.0 fL   MCH 31.5 26.0 - 34.0 pg    MCHC 31.9 30.0 - 36.0 g/dL   RDW 14.1 11.5 - 15.5 %   Platelets 237 150 - 400 K/uL   Neutrophils Relative % 80 (H) 43 - 77 %   Neutro Abs 5.6 1.7 - 7.7 K/uL   Lymphocytes Relative 10 (L) 12 - 46 %   Lymphs Abs 0.7 0.7 - 4.0 K/uL   Monocytes Relative 9 3 - 12 %  Monocytes Absolute 0.6 0.1 - 1.0 K/uL   Eosinophils Relative 1 0 - 5 %   Eosinophils Absolute 0.1 0.0 - 0.7 K/uL   Basophils Relative 0 0 - 1 %   Basophils Absolute 0.0 0.0 - 0.1 K/uL  I-Stat CG4 Lactic Acid, ED  Result Value Ref Range   Lactic Acid, Venous 1.22 0.5 - 2.0 mmol/L    Imaging Review Ct Head Wo Contrast  04/12/2015   CLINICAL DATA:  Fall at nursing home  EXAM: CT HEAD WITHOUT CONTRAST  TECHNIQUE: Contiguous axial images were obtained from the base of the skull through the vertex without intravenous contrast.  COMPARISON:  04/09/2015  FINDINGS: Moderately large left frontal scalp hematoma.  Depressed nasal bone fracture appearing acute and not present on the recent study. Depressed nasal septum fracture has progressed since the prior study.  Advanced atrophy with diffuse cerebral volume loss. Chronic microvascular ischemic change throughout the white matter. Chronic right occipital infarct. Small chronic left occipital infarct  Negative for acute infarct. Negative for intracranial hemorrhage or mass.  IMPRESSION: Depressed nasal bone fracture and nasal septal fracture which appear acute  Left frontal scalp hematoma  Advanced atrophy with chronic ischemia. No acute intracranial abnormality.   Electronically Signed   By: Franchot Gallo M.D.   On: 04/12/2015 06:59   Ct Cervical Spine Wo Contrast  04/12/2015   CLINICAL DATA:  Unwitnessed fall at nursing home, facial lacerations, history hypertension, Alzheimer's, initial encounter  EXAM: CT MAXILLOFACIAL WITHOUT CONTRAST  CT CERVICAL SPINE WITHOUT CONTRAST  TECHNIQUE: Multidetector CT imaging of the cervical spine and maxillofacial structures were performed using the  standard protocol without intravenous contrast. Multiplanar CT image reconstructions of the cervical spine and maxillofacial structures were also generated. Right side of face marked with BB. Patient has severe dementia, with scattered motion artifacts identified.  COMPARISON:  CT cervical spine 04/09/2015, CT maxillofacial 03/28/2015  FINDINGS: CT MAXILLOFACIAL FINDINGS  Visualized intracranial structures significant for generalized atrophy and old RIGHT occipital infarct.  LEFT frontal scalp hematoma.  Intraorbital soft tissue planes clear.  Paranasal sinuses, mastoid air cells and middle ear cavities clear bilaterally.  Mildly displaced anterior nasal septum and BILATERAL nasal bone fractures.  Scattered beam hardening artifacts of dental origin.  Nasal septal deviation to the RIGHT.  No additional facial bone fractures identified.  CT CERVICAL SPINE FINDINGS  Motion artifacts, for which repeat imaging was performed.  Prevertebral soft tissues normal thickness.  Diffuse osseous demineralization.  Multilevel disc space narrowing and endplate spur formation greatest at C5-C6 and C6-C7.  Visualized skullbase intact.  Diffuse BILATERAL facet degenerative changes throughout cervical spine.  Vertebral body heights maintained without fracture or subluxation.  No significant soft tissue abnormalities.  Question mild wall thickening of the cervical and upper thoracic esophagus.  Lung apices clear.  IMPRESSION: Mildly displaced fractures involving the anterior nasal septum and BILATERAL nasal bones.  No additional facial bone abnormalities.  Generalized cerebral atrophy with old RIGHT occipital infarct.  Multilevel degenerative disc and facet disease changes of the cervical spine.  No acute cervical spine abnormalities.   Electronically Signed   By: Lavonia Dana M.D.   On: 04/12/2015 09:50   Ct Maxillofacial Wo Cm  04/12/2015   CLINICAL DATA:  Unwitnessed fall at nursing home, facial lacerations, history hypertension,  Alzheimer's, initial encounter  EXAM: CT MAXILLOFACIAL WITHOUT CONTRAST  CT CERVICAL SPINE WITHOUT CONTRAST  TECHNIQUE: Multidetector CT imaging of the cervical spine and maxillofacial structures were performed using the standard protocol  without intravenous contrast. Multiplanar CT image reconstructions of the cervical spine and maxillofacial structures were also generated. Right side of face marked with BB. Patient has severe dementia, with scattered motion artifacts identified.  COMPARISON:  CT cervical spine 04/09/2015, CT maxillofacial 03/28/2015  FINDINGS: CT MAXILLOFACIAL FINDINGS  Visualized intracranial structures significant for generalized atrophy and old RIGHT occipital infarct.  LEFT frontal scalp hematoma.  Intraorbital soft tissue planes clear.  Paranasal sinuses, mastoid air cells and middle ear cavities clear bilaterally.  Mildly displaced anterior nasal septum and BILATERAL nasal bone fractures.  Scattered beam hardening artifacts of dental origin.  Nasal septal deviation to the RIGHT.  No additional facial bone fractures identified.  CT CERVICAL SPINE FINDINGS  Motion artifacts, for which repeat imaging was performed.  Prevertebral soft tissues normal thickness.  Diffuse osseous demineralization.  Multilevel disc space narrowing and endplate spur formation greatest at C5-C6 and C6-C7.  Visualized skullbase intact.  Diffuse BILATERAL facet degenerative changes throughout cervical spine.  Vertebral body heights maintained without fracture or subluxation.  No significant soft tissue abnormalities.  Question mild wall thickening of the cervical and upper thoracic esophagus.  Lung apices clear.  IMPRESSION: Mildly displaced fractures involving the anterior nasal septum and BILATERAL nasal bones.  No additional facial bone abnormalities.  Generalized cerebral atrophy with old RIGHT occipital infarct.  Multilevel degenerative disc and facet disease changes of the cervical spine.  No acute cervical spine  abnormalities.   Electronically Signed   By: Lavonia Dana M.D.   On: 04/12/2015 09:50     EKG Interpretation   Date/Time:  Thursday Apr 12 2015 06:14:55 EDT Ventricular Rate:  45 PR Interval:  219 QRS Duration: 102 QT Interval:  473 QTC Calculation: 409 R Axis:   8 Text Interpretation:  Sinus bradycardia Low voltage, extremity leads When  compared with ECG of 04/09/2015, No significant change was found Confirmed  by [facility]  MD, Taylyn Brame ([id number]) on 04/12/2015 6:23:54 AM      MDM   Final diagnoses:  Fall at nursing home, initial encounter  Nasal fracture, closed, initial encounter  Laceration    Fall with facial injury. Bradycardia. Hypothermia. He will be sent for CT scans of head, maxillofacial, cervical spine. Screening labs will be obtained because of hypothermia. Old records are reviewed and he has multiple ED visits for falls - this is his 15th ED visit for a fall since 12/01/2014.  CTs are significant for nasal fracture. Chris lawyer PA-C will do laceration repair and case is signed out to Dr. Betsey Holiday.  Delora Fuel, MD [id number]/45/[id number]

## 2015-04-12 NOTE — ED Notes (Signed)
Patient transported to CT 

## 2015-04-12 NOTE — ED Notes (Signed)
Attempted to call Nursing Home twice. No answer

## 2015-04-12 NOTE — ED Provider Notes (Signed)
Patient signed out to me by Dr. Roxanne Mins for evaluation after fall. Patient has had multiple recent falls, seen once again for injuries from a fall. CT head, maxillofacial bones, cervical spine performed. Patient has bilateral nasal fractures. No septal hematoma on examination. Patient had laceration of nose repaired by Gerald Stabs lawyer, PA-C. Patient to be returned to nursing home.  Orpah Greek, MD 04/12/15 [phone]

## 2015-04-14 ENCOUNTER — Encounter (HOSPITAL_COMMUNITY): Payer: Self-pay | Admitting: Emergency Medicine

## 2015-04-14 ENCOUNTER — Emergency Department (HOSPITAL_COMMUNITY): Payer: Medicare Other

## 2015-04-14 ENCOUNTER — Emergency Department (HOSPITAL_COMMUNITY)
Admission: EM | Admit: 2015-04-14 | Discharge: 2015-04-15 | Disposition: A | Discharge status: Home / Self Care | Payer: Medicare Other | Attending: Emergency Medicine | Admitting: Emergency Medicine

## 2015-04-14 DIAGNOSIS — K219 Gastro-esophageal reflux disease without esophagitis: Secondary | ICD-10-CM | POA: Insufficient documentation

## 2015-04-14 DIAGNOSIS — Z8546 Personal history of malignant neoplasm of prostate: Secondary | ICD-10-CM | POA: Insufficient documentation

## 2015-04-14 DIAGNOSIS — Z8639 Personal history of other endocrine, nutritional and metabolic disease: Secondary | ICD-10-CM | POA: Insufficient documentation

## 2015-04-14 DIAGNOSIS — S0033XA Contusion of nose, initial encounter: Secondary | ICD-10-CM | POA: Diagnosis not present

## 2015-04-14 DIAGNOSIS — S199XXA Unspecified injury of neck, initial encounter: Secondary | ICD-10-CM | POA: Diagnosis not present

## 2015-04-14 DIAGNOSIS — I10 Essential (primary) hypertension: Secondary | ICD-10-CM | POA: Diagnosis not present

## 2015-04-14 DIAGNOSIS — M109 Gout, unspecified: Secondary | ICD-10-CM | POA: Diagnosis not present

## 2015-04-14 DIAGNOSIS — Y939 Activity, unspecified: Secondary | ICD-10-CM | POA: Insufficient documentation

## 2015-04-14 DIAGNOSIS — W19XXXA Unspecified fall, initial encounter: Secondary | ICD-10-CM | POA: Diagnosis not present

## 2015-04-14 DIAGNOSIS — S0083XA Contusion of other part of head, initial encounter: Secondary | ICD-10-CM | POA: Diagnosis not present

## 2015-04-14 DIAGNOSIS — G309 Alzheimer's disease, unspecified: Secondary | ICD-10-CM | POA: Insufficient documentation

## 2015-04-14 DIAGNOSIS — Z792 Long term (current) use of antibiotics: Secondary | ICD-10-CM | POA: Insufficient documentation

## 2015-04-14 DIAGNOSIS — M1990 Unspecified osteoarthritis, unspecified site: Secondary | ICD-10-CM | POA: Diagnosis not present

## 2015-04-14 DIAGNOSIS — Z872 Personal history of diseases of the skin and subcutaneous tissue: Secondary | ICD-10-CM | POA: Insufficient documentation

## 2015-04-14 DIAGNOSIS — Y999 Unspecified external cause status: Secondary | ICD-10-CM | POA: Insufficient documentation

## 2015-04-14 DIAGNOSIS — Z79899 Other long term (current) drug therapy: Secondary | ICD-10-CM | POA: Insufficient documentation

## 2015-04-14 DIAGNOSIS — Y92128 Other place in nursing home as the place of occurrence of the external cause: Secondary | ICD-10-CM | POA: Insufficient documentation

## 2015-04-14 DIAGNOSIS — S0993XA Unspecified injury of face, initial encounter: Secondary | ICD-10-CM | POA: Diagnosis present

## 2015-04-14 MED ORDER — DIPHENHYDRAMINE HCL 50 MG/ML IJ SOLN
50.0000 mg | Freq: Once | INTRAMUSCULAR | Status: AC
  Administered 2015-04-14: 50 mg via INTRAMUSCULAR
  Filled 2015-04-14: qty 1

## 2015-04-14 MED ORDER — LORAZEPAM 2 MG/ML IJ SOLN
1.0000 mg | Freq: Once | INTRAMUSCULAR | Status: AC
  Administered 2015-04-14: 1 mg via INTRAMUSCULAR
  Filled 2015-04-14: qty 1

## 2015-04-14 NOTE — ED Provider Notes (Signed)
CSN: [id number]     Arrival date & time 04/14/15  2010 History   First MD Initiated Contact with Patient 04/14/15 2017     Chief Complaint  Patient presents with  . Fall     (Consider location/radiation/quality/duration/timing/severity/associated sxs/prior Treatment) HPI MERVIN RAMIRES is a 81 y.o. male with past medical history of hypertension, hyperlipidemia, Alzheimer's disease presenting to emergency department after a unwitnessed fall. Patient was found the ground at his nursing facility. He has bruising over his face however he has had multiple falls and they're unsure how old this bruising is. He is unable to provide any history. He does not appear to have any discomfort currently.  10 Systems reviewed and are negative for acute change except as noted in the HPI.    Past Medical History  Diagnosis Date  . Hypertension   . Gout   . Hypercholesteremia   . Arthritis   . Acid reflux   . Cancer     Cancer-prostate  . Edema   . Psoriasis   . Alzheimer disease    Past Surgical History  Procedure Laterality Date  . Cataract extraction w/phaco  07/01/2012    Procedure: CATARACT EXTRACTION PHACO AND INTRAOCULAR LENS PLACEMENT (IOC);  Surgeon: Tonny Branch, MD;  Location: [facility];  Service: Ophthalmology;  Laterality: Right;  CDE 19.91  . Cataract extraction w/phaco  09/02/2012    Procedure: CATARACT EXTRACTION PHACO AND INTRAOCULAR LENS PLACEMENT (IOC);  Surgeon: Tonny Branch, MD;  Location: [facility];  Service: Ophthalmology;  Laterality: Left;  CDE 16.23  . Tonsillectomy    . Dilatation of esophageal stricture     Family History  Problem Relation Age of Onset  . Heart attack Mother   . Stroke Father    History  Substance Use Topics  . Smoking status: Never Smoker   . Smokeless tobacco: Never Used  . Alcohol Use: Yes     Comment: 2 glasses of wine daily    Review of Systems    Allergies  Review of patient's allergies indicates no known allergies.  Home Medications    Prior to Admission medications   Medication Sig Start Date End Date Taking? Authorizing Provider  acetaminophen (TYLENOL) 325 MG tablet Take 650 mg by mouth every 6 (six) hours as needed for moderate pain (pain).     Historical Provider, MD  cephALEXin (KEFLEX) 500 MG capsule Take 1 capsule (500 mg total) by mouth 4 (four) times daily. 04/12/15   Orpah Greek, MD  chlorproMAZINE (THORAZINE) 25 MG tablet Take 25 mg by mouth every 6 (six) hours as needed (for hiccups).    Historical Provider, MD  donepezil (ARICEPT) 10 MG tablet Take 10 mg by mouth daily with breakfast.    Historical Provider, MD  ENSURE (ENSURE) Take 237 mLs by mouth daily with breakfast.    Historical Provider, MD  etodolac (LODINE) 400 MG tablet Take 400 mg by mouth daily with breakfast.     Historical Provider, MD  furosemide (LASIX) 20 MG tablet Take 20 mg by mouth daily with breakfast.     Historical Provider, MD  lisinopril (PRINIVIL,ZESTRIL) 20 MG tablet Take 20 mg by mouth daily after breakfast.     Historical Provider, MD  loperamide (IMODIUM) 2 MG capsule Take 2 mg by mouth every 4 (four) hours as needed for diarrhea or loose stools (diarrhea).     Historical Provider, MD  LORazepam (ATIVAN) 0.5 MG tablet TAKE 1 TABLET BY MOUTH EVERY EIGHT HOURS AS NEEDED FOR  ANXIETY. Patient taking differently: TAKE 1 TABLET BY MOUTH EVERY six HOURS AS NEEDED FOR ANXIETY    Take one scheduled dose at 4pm 12/04/14   Asencion Partridge Dohmeier, MD  LORazepam (ATIVAN) 0.5 MG tablet Take 0.5 mg by mouth daily.     Historical Provider, MD  Multiple Vitamins-Minerals (ELDERTONIC PO) Take 15 mLs by mouth 3 (three) times daily.    Historical Provider, MD  Multiple Vitamins-Minerals (I-VITE) TABS Take 1 tablet by mouth daily with breakfast.    Historical Provider, MD  omeprazole (PRILOSEC) 20 MG capsule Take 20 mg by mouth daily with breakfast.     Historical Provider, MD  probenecid (BENEMID) 500 MG tablet Take 500 mg by mouth daily with breakfast.      Historical Provider, MD  risperiDONE (RISPERDAL) 0.25 MG tablet Take 1 tablet (0.25 mg total) by mouth 2 (two) times daily. 01/02/15   Ward Givens, NP   BP 97/55 mmHg  Pulse 66  Temp(Src)   Resp 16  SpO2 100% Physical Exam  Constitutional: Vital signs are normal. He appears well-developed and well-nourished.  Non-toxic appearance. He does not appear ill. No distress.  HENT:  Nose: Nose normal.  Mouth/Throat: Oropharynx is clear and moist. No oropharyngeal exudate.  Bruising seen at nasal bridge and underneath both eyes. Soft tissue hematoma seen to the mid forehead.C-spine tenderness. No crepitus or step-offs.  Eyes: Conjunctivae and EOM are normal. Pupils are equal, round, and reactive to light. No scleral icterus.  Neck: Normal range of motion. Neck supple. No tracheal deviation, no edema, no erythema and normal range of motion present. No thyroid mass and no thyromegaly present.  Cardiovascular: Normal rate, regular rhythm, S1 normal, S2 normal, normal heart sounds, intact distal pulses and normal pulses.  Exam reveals no gallop and no friction rub.   No murmur heard. Pulses:      Radial pulses are 2+ on the right side, and 2+ on the left side.       Dorsalis pedis pulses are 2+ on the right side, and 2+ on the left side.  Pulmonary/Chest: Effort normal and breath sounds normal. No respiratory distress. He has no wheezes. He has no rhonchi. He has no rales.  Abdominal: Soft. Normal appearance and bowel sounds are normal. He exhibits no distension, no ascites and no mass. There is no hepatosplenomegaly. There is no tenderness. There is no rebound, no guarding and no CVA tenderness.  Musculoskeletal: Normal range of motion. He exhibits no edema or tenderness.  Lymphadenopathy:    He has no cervical adenopathy.  Neurological: He is alert. He has normal strength. No sensory deficit.  Moves all extremities and withdraws to pain. Will not follow commands for full neurological exam.   Skin: Skin is warm, dry and intact. No petechiae and no rash noted. He is not diaphoretic. No erythema. No pallor.  Nursing note and vitals reviewed.   ED Course  Procedures (including critical care time) Labs Review Labs Reviewed - No data to display  Imaging Review Ct Head Wo Contrast  04/14/2015   CLINICAL DATA:  Status post fall, with concern for head, maxillofacial or cervical spine injury. Initial encounter.  EXAM: CT HEAD WITHOUT CONTRAST  CT MAXILLOFACIAL WITHOUT CONTRAST  CT CERVICAL SPINE WITHOUT CONTRAST  TECHNIQUE: Multidetector CT imaging of the head, cervical spine, and maxillofacial structures were performed using the standard protocol without intravenous contrast. Multiplanar CT image reconstructions of the cervical spine and maxillofacial structures were also generated.  COMPARISON:  CT of the  head, maxillofacial structures and cervical spine performed 04/12/2015  FINDINGS: CT HEAD FINDINGS  There is no evidence of acute infarction, mass lesion, or intra- or extra-axial hemorrhage on CT.  Prominence of the ventricles and sulci reflects moderate cortical volume loss. Cerebellar atrophy is noted. Scattered periventricular and subcortical white matter change likely reflects small vessel ischemic microangiopathy. Chronic ischemic change is noted at the occipital lobes bilaterally, more prominent on the right.  The brainstem and fourth ventricle are within normal limits. The basal ganglia are unremarkable in appearance. The cerebral hemispheres demonstrate grossly normal gray-white differentiation. No mass effect or midline shift is seen.  There is no evidence of fracture; visualized osseous structures are unremarkable in appearance. The visualized portions of the orbits are within normal limits. The paranasal sinuses and mastoid air cells are well-aerated. Soft tissue swelling is noted overlying the left frontal calvarium.  CT MAXILLOFACIAL FINDINGS  The patient's nasal bone fracture is  relatively stable in appearance from the prior study. No additional acute fractures are seen. The mandible appears intact. The visualized dentition demonstrates no acute abnormality.  The orbits are intact bilaterally. The visualized paranasal sinuses and mastoid air cells are well-aerated.  Mild soft tissue swelling is noted about the upper lip. Soft tissue swelling is also noted overlying the left frontal calvarium. The parapharyngeal fat planes are preserved. The nasopharynx, oropharynx and hypopharynx are unremarkable in appearance. The visualized portions of the valleculae and piriform sinuses are grossly unremarkable.  The parotid and submandibular glands are within normal limits. No cervical lymphadenopathy is seen.  CT CERVICAL SPINE FINDINGS  There is no evidence of fracture or subluxation. Vertebral bodies demonstrate normal height and alignment. Multilevel disc space narrowing and vacuum phenomenon are noted along the cervical spine, with scattered small anterior and posterior disc osteophyte complexes. Mild underlying facet disease noted. Prevertebral soft tissues are within normal limits.  The thyroid gland is unremarkable in appearance. The visualized lung apices are clear. No significant soft tissue abnormalities are seen.  IMPRESSION: 1. No evidence of traumatic intracranial injury or fracture. 2. Stable appearance to previously noted acute nasal bone fracture. 3. No evidence of fracture or subluxation along the cervical spine. 4. Mild soft tissue swelling noted about the upper lip. Soft tissue swelling overlying the left frontal calvarium. 5. Chronic ischemic change at the occipital lobes bilaterally, more prominent on the right. 6. Moderate cortical volume loss and scattered small vessel ischemic microangiopathy. 7. Mild degenerative change noted along the cervical spine.   Electronically Signed   By: Garald Balding M.D.   On: 04/14/2015 22:36   Ct Cervical Spine Wo Contrast  04/14/2015    CLINICAL DATA:  Status post fall, with concern for head, maxillofacial or cervical spine injury. Initial encounter.  EXAM: CT HEAD WITHOUT CONTRAST  CT MAXILLOFACIAL WITHOUT CONTRAST  CT CERVICAL SPINE WITHOUT CONTRAST  TECHNIQUE: Multidetector CT imaging of the head, cervical spine, and maxillofacial structures were performed using the standard protocol without intravenous contrast. Multiplanar CT image reconstructions of the cervical spine and maxillofacial structures were also generated.  COMPARISON:  CT of the head, maxillofacial structures and cervical spine performed 04/12/2015  FINDINGS: CT HEAD FINDINGS  There is no evidence of acute infarction, mass lesion, or intra- or extra-axial hemorrhage on CT.  Prominence of the ventricles and sulci reflects moderate cortical volume loss. Cerebellar atrophy is noted. Scattered periventricular and subcortical white matter change likely reflects small vessel ischemic microangiopathy. Chronic ischemic change is noted at the occipital lobes bilaterally,  more prominent on the right.  The brainstem and fourth ventricle are within normal limits. The basal ganglia are unremarkable in appearance. The cerebral hemispheres demonstrate grossly normal gray-white differentiation. No mass effect or midline shift is seen.  There is no evidence of fracture; visualized osseous structures are unremarkable in appearance. The visualized portions of the orbits are within normal limits. The paranasal sinuses and mastoid air cells are well-aerated. Soft tissue swelling is noted overlying the left frontal calvarium.  CT MAXILLOFACIAL FINDINGS  The patient's nasal bone fracture is relatively stable in appearance from the prior study. No additional acute fractures are seen. The mandible appears intact. The visualized dentition demonstrates no acute abnormality.  The orbits are intact bilaterally. The visualized paranasal sinuses and mastoid air cells are well-aerated.  Mild soft tissue swelling  is noted about the upper lip. Soft tissue swelling is also noted overlying the left frontal calvarium. The parapharyngeal fat planes are preserved. The nasopharynx, oropharynx and hypopharynx are unremarkable in appearance. The visualized portions of the valleculae and piriform sinuses are grossly unremarkable.  The parotid and submandibular glands are within normal limits. No cervical lymphadenopathy is seen.  CT CERVICAL SPINE FINDINGS  There is no evidence of fracture or subluxation. Vertebral bodies demonstrate normal height and alignment. Multilevel disc space narrowing and vacuum phenomenon are noted along the cervical spine, with scattered small anterior and posterior disc osteophyte complexes. Mild underlying facet disease noted. Prevertebral soft tissues are within normal limits.  The thyroid gland is unremarkable in appearance. The visualized lung apices are clear. No significant soft tissue abnormalities are seen.  IMPRESSION: 1. No evidence of traumatic intracranial injury or fracture. 2. Stable appearance to previously noted acute nasal bone fracture. 3. No evidence of fracture or subluxation along the cervical spine. 4. Mild soft tissue swelling noted about the upper lip. Soft tissue swelling overlying the left frontal calvarium. 5. Chronic ischemic change at the occipital lobes bilaterally, more prominent on the right. 6. Moderate cortical volume loss and scattered small vessel ischemic microangiopathy. 7. Mild degenerative change noted along the cervical spine.   Electronically Signed   By: Garald Balding M.D.   On: 04/14/2015 22:36   Ct Maxillofacial Wo Cm  04/14/2015   CLINICAL DATA:  Status post fall, with concern for head, maxillofacial or cervical spine injury. Initial encounter.  EXAM: CT HEAD WITHOUT CONTRAST  CT MAXILLOFACIAL WITHOUT CONTRAST  CT CERVICAL SPINE WITHOUT CONTRAST  TECHNIQUE: Multidetector CT imaging of the head, cervical spine, and maxillofacial structures were performed  using the standard protocol without intravenous contrast. Multiplanar CT image reconstructions of the cervical spine and maxillofacial structures were also generated.  COMPARISON:  CT of the head, maxillofacial structures and cervical spine performed 04/12/2015  FINDINGS: CT HEAD FINDINGS  There is no evidence of acute infarction, mass lesion, or intra- or extra-axial hemorrhage on CT.  Prominence of the ventricles and sulci reflects moderate cortical volume loss. Cerebellar atrophy is noted. Scattered periventricular and subcortical white matter change likely reflects small vessel ischemic microangiopathy. Chronic ischemic change is noted at the occipital lobes bilaterally, more prominent on the right.  The brainstem and fourth ventricle are within normal limits. The basal ganglia are unremarkable in appearance. The cerebral hemispheres demonstrate grossly normal gray-white differentiation. No mass effect or midline shift is seen.  There is no evidence of fracture; visualized osseous structures are unremarkable in appearance. The visualized portions of the orbits are within normal limits. The paranasal sinuses and mastoid air cells are  well-aerated. Soft tissue swelling is noted overlying the left frontal calvarium.  CT MAXILLOFACIAL FINDINGS  The patient's nasal bone fracture is relatively stable in appearance from the prior study. No additional acute fractures are seen. The mandible appears intact. The visualized dentition demonstrates no acute abnormality.  The orbits are intact bilaterally. The visualized paranasal sinuses and mastoid air cells are well-aerated.  Mild soft tissue swelling is noted about the upper lip. Soft tissue swelling is also noted overlying the left frontal calvarium. The parapharyngeal fat planes are preserved. The nasopharynx, oropharynx and hypopharynx are unremarkable in appearance. The visualized portions of the valleculae and piriform sinuses are grossly unremarkable.  The parotid  and submandibular glands are within normal limits. No cervical lymphadenopathy is seen.  CT CERVICAL SPINE FINDINGS  There is no evidence of fracture or subluxation. Vertebral bodies demonstrate normal height and alignment. Multilevel disc space narrowing and vacuum phenomenon are noted along the cervical spine, with scattered small anterior and posterior disc osteophyte complexes. Mild underlying facet disease noted. Prevertebral soft tissues are within normal limits.  The thyroid gland is unremarkable in appearance. The visualized lung apices are clear. No significant soft tissue abnormalities are seen.  IMPRESSION: 1. No evidence of traumatic intracranial injury or fracture. 2. Stable appearance to previously noted acute nasal bone fracture. 3. No evidence of fracture or subluxation along the cervical spine. 4. Mild soft tissue swelling noted about the upper lip. Soft tissue swelling overlying the left frontal calvarium. 5. Chronic ischemic change at the occipital lobes bilaterally, more prominent on the right. 6. Moderate cortical volume loss and scattered small vessel ischemic microangiopathy. 7. Mild degenerative change noted along the cervical spine.   Electronically Signed   By: Garald Balding M.D.   On: 04/14/2015 22:36     EKG Interpretation None      MDM   Final diagnoses:  Fall  Fall  Fall   patient since emergency department after an unwitnessed fall. C-collar was applied. Patient's been seen multiple times since emergency department for the same. Due to age and risk factors will obtain CT scan to evaluate for any significant injury. I have seen this patient before and his neurological exam and presentation appear to be at baseline.   CT scan does not reveal any significant injury. Patient is in no acute distress, he is at his baseline. His vital signs were within his normal limits and he is safe for discharge.  Everlene Balls, MD 04/14/15 2240

## 2015-04-14 NOTE — ED Notes (Signed)
Pt come from [place]. Pt had unwitnessed fall was found sitting up against side of bed. Pt has aged bruising to eyes and blood on face in which none of staff knew how pt obtained bruising or where blood on face came from. Pt has hx of dementia is at baseline.

## 2015-04-14 NOTE — Discharge Instructions (Signed)
Fall Prevention and Home Safety William Ramos, your CT scans today are negative. See a primary care physician within 3 days for close follow-up of your fall. If any symptoms worsen come back to emergency department immediately. Thank you.   IMPRESSION: 1. No evidence of traumatic intracranial injury or fracture. 2. Stable appearance to previously noted acute nasal bone fracture. 3. No evidence of fracture or subluxation along the cervical spine. 4. Mild soft tissue swelling noted about the upper lip. Soft tissue swelling overlying the left frontal calvarium. 5. Chronic ischemic change at the occipital lobes bilaterally, more prominent on the right. 6. Moderate cortical volume loss and scattered small vessel ischemic microangiopathy. 7. Mild degenerative change noted along the cervical spine.  Falls cause injuries and can affect all age groups. It is possible to prevent falls.  HOW TO PREVENT FALLS  Wear shoes with rubber soles that do not have an opening for your toes.  Keep the inside and outside of your house well lit.  Use night lights throughout your home.  Remove clutter from floors.  Clean up floor spills.  Remove throw rugs or fasten them to the floor with carpet tape.  Do not place electrical cords across pathways.  Put grab bars by your tub, shower, and toilet. Do not use towel bars as grab bars.  Put handrails on both sides of the stairway. Fix loose handrails.  Do not climb on stools or stepladders, if possible.  Do not wax your floors.  Repair uneven or unsafe sidewalks, walkways, or stairs.  Keep items you use a lot within reach.  Be aware of pets.  Keep emergency numbers next to the telephone.  Put smoke detectors in your home and near bedrooms. Ask your doctor what other things you can do to prevent falls. Document Released: 09/13/2009 Document Revised: 05/18/2012 Document Reviewed: 02/17/2012 [facility] Patient Information 2015 [place], [place]. This  information is not intended to replace advice given to you by your health care provider. Make sure you discuss any questions you have with your health care provider.

## 2015-04-14 NOTE — ED Notes (Signed)
Attempted to call d/c report to facility x 2 w/o success.  Will send d/c instructions w/ pt.  Pt is unable to comprehend d/c instructions d/t dementia.

## 2015-04-14 NOTE — ED Notes (Signed)
Bed: WA04 Expected date:  Expected time:  Means of arrival:  Comments: EMS fall, hx of dementia

## 2015-04-26 ENCOUNTER — Ambulatory Visit: Payer: Medicare Other | Admitting: Nurse Practitioner

## 2015-05-13 ENCOUNTER — Emergency Department (HOSPITAL_COMMUNITY)
Admission: EM | Admit: 2015-05-13 | Discharge: 2015-05-13 | Disposition: A | Discharge status: Home / Self Care | Payer: Medicare Other | Attending: Emergency Medicine | Admitting: Emergency Medicine

## 2015-05-13 ENCOUNTER — Encounter (HOSPITAL_COMMUNITY): Payer: Self-pay | Admitting: Oncology

## 2015-05-13 ENCOUNTER — Emergency Department (HOSPITAL_COMMUNITY): Payer: Medicare Other

## 2015-05-13 DIAGNOSIS — Z8639 Personal history of other endocrine, nutritional and metabolic disease: Secondary | ICD-10-CM | POA: Diagnosis not present

## 2015-05-13 DIAGNOSIS — G309 Alzheimer's disease, unspecified: Secondary | ICD-10-CM | POA: Insufficient documentation

## 2015-05-13 DIAGNOSIS — Y998 Other external cause status: Secondary | ICD-10-CM | POA: Insufficient documentation

## 2015-05-13 DIAGNOSIS — Y9389 Activity, other specified: Secondary | ICD-10-CM | POA: Insufficient documentation

## 2015-05-13 DIAGNOSIS — F0280 Dementia in other diseases classified elsewhere without behavioral disturbance: Secondary | ICD-10-CM | POA: Insufficient documentation

## 2015-05-13 DIAGNOSIS — W01198A Fall on same level from slipping, tripping and stumbling with subsequent striking against other object, initial encounter: Secondary | ICD-10-CM | POA: Insufficient documentation

## 2015-05-13 DIAGNOSIS — M109 Gout, unspecified: Secondary | ICD-10-CM | POA: Insufficient documentation

## 2015-05-13 DIAGNOSIS — K219 Gastro-esophageal reflux disease without esophagitis: Secondary | ICD-10-CM | POA: Diagnosis not present

## 2015-05-13 DIAGNOSIS — Z792 Long term (current) use of antibiotics: Secondary | ICD-10-CM | POA: Diagnosis not present

## 2015-05-13 DIAGNOSIS — W19XXXA Unspecified fall, initial encounter: Secondary | ICD-10-CM

## 2015-05-13 DIAGNOSIS — Z79899 Other long term (current) drug therapy: Secondary | ICD-10-CM | POA: Insufficient documentation

## 2015-05-13 DIAGNOSIS — Z791 Long term (current) use of non-steroidal anti-inflammatories (NSAID): Secondary | ICD-10-CM | POA: Insufficient documentation

## 2015-05-13 DIAGNOSIS — M1990 Unspecified osteoarthritis, unspecified site: Secondary | ICD-10-CM | POA: Diagnosis not present

## 2015-05-13 DIAGNOSIS — Y92128 Other place in nursing home as the place of occurrence of the external cause: Secondary | ICD-10-CM | POA: Diagnosis not present

## 2015-05-13 DIAGNOSIS — Z8546 Personal history of malignant neoplasm of prostate: Secondary | ICD-10-CM | POA: Diagnosis not present

## 2015-05-13 DIAGNOSIS — Z043 Encounter for examination and observation following other accident: Secondary | ICD-10-CM | POA: Diagnosis present

## 2015-05-13 DIAGNOSIS — I10 Essential (primary) hypertension: Secondary | ICD-10-CM | POA: Diagnosis not present

## 2015-05-13 DIAGNOSIS — Z872 Personal history of diseases of the skin and subcutaneous tissue: Secondary | ICD-10-CM | POA: Insufficient documentation

## 2015-05-13 DIAGNOSIS — S0101XA Laceration without foreign body of scalp, initial encounter: Secondary | ICD-10-CM

## 2015-05-13 LAB — CBC WITH DIFFERENTIAL/PLATELET
BASOS ABS: 0 10*3/uL (ref 0.0–0.1)
Basophils Relative: 0 % (ref 0–1)
EOS ABS: 0 10*3/uL (ref 0.0–0.7)
Eosinophils Relative: 0 % (ref 0–5)
HCT: 35.3 % — ABNORMAL LOW (ref 39.0–52.0)
Hemoglobin: 11.4 g/dL — ABNORMAL LOW (ref 13.0–17.0)
Lymphocytes Relative: 7 % — ABNORMAL LOW (ref 12–46)
Lymphs Abs: 0.6 10*3/uL — ABNORMAL LOW (ref 0.7–4.0)
MCH: 32 pg (ref 26.0–34.0)
MCHC: 32.3 g/dL (ref 30.0–36.0)
MCV: 99.2 fL (ref 78.0–100.0)
MONOS PCT: 10 % (ref 3–12)
Monocytes Absolute: 0.8 10*3/uL (ref 0.1–1.0)
NEUTROS ABS: 6.5 10*3/uL (ref 1.7–7.7)
Neutrophils Relative %: 83 % — ABNORMAL HIGH (ref 43–77)
PLATELETS: 199 10*3/uL (ref 150–400)
RBC: 3.56 MIL/uL — ABNORMAL LOW (ref 4.22–5.81)
RDW: 13.6 % (ref 11.5–15.5)
WBC: 7.9 10*3/uL (ref 4.0–10.5)

## 2015-05-13 LAB — COMPREHENSIVE METABOLIC PANEL
ALK PHOS: 103 U/L (ref 38–126)
ALT: 35 U/L (ref 17–63)
ANION GAP: 9 (ref 5–15)
AST: 36 U/L (ref 15–41)
Albumin: 3.3 g/dL — ABNORMAL LOW (ref 3.5–5.0)
BILIRUBIN TOTAL: 0.4 mg/dL (ref 0.3–1.2)
BUN: 51 mg/dL — AB (ref 6–20)
CALCIUM: 9.4 mg/dL (ref 8.9–10.3)
CO2: 26 mmol/L (ref 22–32)
Chloride: 106 mmol/L (ref 101–111)
Creatinine, Ser: 1.49 mg/dL — ABNORMAL HIGH (ref 0.61–1.24)
GFR calc Af Amer: 48 mL/min — ABNORMAL LOW (ref >60)
GFR calc non Af Amer: 41 mL/min — ABNORMAL LOW (ref >60)
GLUCOSE: 110 mg/dL — AB (ref 65–99)
Potassium: 4.4 mmol/L (ref 3.5–5.1)
Sodium: 141 mmol/L (ref 135–145)
Total Protein: 6.2 g/dL — ABNORMAL LOW (ref 6.5–8.1)

## 2015-05-13 NOTE — ED Notes (Signed)
Bed: DH78 Expected date:  Expected time:  Means of arrival:  Comments: EMS non-cooperative Dementia pt

## 2015-05-13 NOTE — ED Notes (Signed)
Patient transported to CT 

## 2015-05-13 NOTE — ED Notes (Signed)
Per EMS pt had a witnessed fall at [facility] and hit the back of his head.  Bleeding noted.  No LOC.  Pt combative and agitated en route.  Hx of alzheimer's.

## 2015-05-13 NOTE — Discharge Instructions (Signed)
Staples need to be removed in 7 days. They can be removed by your primary physician or in the emergency department.  Head Injury You have received a head injury. It does not appear serious at this time. Headaches and vomiting are common following head injury. It should be easy to awaken from sleeping. Sometimes it is necessary for you to stay in the emergency department for a while for observation. Sometimes admission to the hospital may be needed. After injuries such as yours, most problems occur within the first 24 hours, but side effects may occur up to 7-10 days after the injury. It is important for you to carefully monitor your condition and contact your health care provider or seek immediate medical care if there is a change in your condition. WHAT ARE THE TYPES OF HEAD INJURIES? Head injuries can be as minor as a bump. Some head injuries can be more severe. More severe head injuries include:  A jarring injury to the brain (concussion).  A bruise of the brain (contusion). This mean there is bleeding in the brain that can cause swelling.  A cracked skull (skull fracture).  Bleeding in the brain that collects, clots, and forms a bump (hematoma). WHAT CAUSES A HEAD INJURY? A serious head injury is most likely to happen to someone who is in a car wreck and is not wearing a seat belt. Other causes of major head injuries include bicycle or motorcycle accidents, sports injuries, and falls. HOW ARE HEAD INJURIES DIAGNOSED? A complete history of the event leading to the injury and your current symptoms will be helpful in diagnosing head injuries. Many times, pictures of the brain, such as CT or MRI are needed to see the extent of the injury. Often, an overnight hospital stay is necessary for observation.  WHEN SHOULD I SEEK IMMEDIATE MEDICAL CARE?  You should get help right away if:  You have confusion or drowsiness.  You feel sick to your stomach (nauseous) or have continued, forceful  vomiting.  You have dizziness or unsteadiness that is getting worse.  You have severe, continued headaches not relieved by medicine. Only take over-the-counter or prescription medicines for pain, fever, or discomfort as directed by your health care provider.  You do not have normal function of the arms or legs or are unable to walk.  You notice changes in the black spots in the center of the colored part of your eye (pupil).  You have a clear or bloody fluid coming from your nose or ears.  You have a loss of vision. During the next 24 hours after the injury, you must stay with someone who can watch you for the warning signs. This person should contact local emergency services (911 in the [place] if you have seizures, you become unconscious, or you are unable to wake up. HOW CAN I PREVENT A HEAD INJURY IN THE FUTURE? The most important factor for preventing major head injuries is avoiding motor vehicle accidents. To minimize the potential for damage to your head, it is crucial to wear seat belts while riding in motor vehicles. Wearing helmets while bike riding and playing collision sports (like football) is also helpful. Also, avoiding dangerous activities around the house will further help reduce your risk of head injury.  WHEN CAN I RETURN TO NORMAL ACTIVITIES AND ATHLETICS? You should be reevaluated by your health care provider before returning to these activities. If you have any of the following symptoms, you should not return to activities or contact sports until  1 week after the symptoms have stopped:  Persistent headache.  Dizziness or vertigo.  Poor attention and concentration.  Confusion.  Memory problems.  Nausea or vomiting.  Fatigue or tire easily.  Irritability.  Intolerant of bright lights or loud noises.  Anxiety or depression.  Disturbed sleep. MAKE SURE YOU:   Understand these instructions.  Will watch your condition.  Will get help right away if you are  not doing well or get worse. Document Released: 11/17/2005 Document Revised: 11/22/2013 Document Reviewed: 07/25/2013 [facility] Patient Information 2015 [place], [place]. This information is not intended to replace advice given to you by your health care provider. Make sure you discuss any questions you have with your health care provider.  Laceration Care, Adult A laceration is a cut that goes through all layers of the skin. The cut goes into the tissue beneath the skin. HOME CARE For stitches (sutures) or staples:  Keep the cut clean and dry.  If you have a bandage (dressing), change it at least once a day. Change the bandage if it gets wet or dirty, or as told by your doctor.  Wash the cut with soap and water 2 times a day. Rinse the cut with water. Pat it dry with a clean towel.  Put a thin layer of medicated cream on the cut as told by your doctor.  You may shower after the first 24 hours. Do not soak the cut in water until the stitches are removed.  Only take medicines as told by your doctor.  Have your stitches or staples removed as told by your doctor. For skin adhesive strips:  Keep the cut clean and dry.  Do not get the strips wet. You may take a bath, but be careful to keep the cut dry.  If the cut gets wet, pat it dry with a clean towel.  The strips will fall off on their own. Do not remove the strips that are still stuck to the cut. For wound glue:  You may shower or take baths. Do not soak or scrub the cut. Do not swim. Avoid heavy sweating until the glue falls off on its own. After a shower or bath, pat the cut dry with a clean towel.  Do not put medicine on your cut until the glue falls off.  If you have a bandage, do not put tape over the glue.  Avoid lots of sunlight or tanning lamps until the glue falls off. Put sunscreen on the cut for the first year to reduce your scar.  The glue will fall off on its own. Do not pick at the glue. You may need a tetanus  shot if:  You cannot remember when you had your last tetanus shot.  You have never had a tetanus shot. If you need a tetanus shot and you choose not to have one, you may get tetanus. Sickness from tetanus can be serious. GET HELP RIGHT AWAY IF:   Your pain does not get better with medicine.  Your arm, hand, leg, or foot loses feeling (numbness) or changes color.  Your cut is bleeding.  Your joint feels weak, or you cannot use your joint.  You have painful lumps on your body.  Your cut is red, puffy (swollen), or painful.  You have a red line on the skin near the cut.  You have yellowish-white fluid (pus) coming from the cut.  You have a fever.  You have a bad smell coming from the cut or bandage.  Your cut breaks open before or after stitches are removed.  You notice something coming out of the cut, such as wood or glass.  You cannot move a finger or toe. MAKE SURE YOU:   Understand these instructions.  Will watch your condition.  Will get help right away if you are not doing well or get worse. Document Released: 05/05/2008 Document Revised: 02/09/2012 Document Reviewed: 05/13/2011 [facility] Patient Information 2015 [place], [place]. This information is not intended to replace advice given to you by your health care provider. Make sure you discuss any questions you have with your health care provider.

## 2015-05-13 NOTE — ED Notes (Signed)
Attempted to call report to SNF x 2 w/o success.

## 2015-05-13 NOTE — ED Provider Notes (Signed)
CSN: [id number]     Arrival date & time 05/13/15  0241 History   First MD Initiated Contact with Patient 05/13/15 0246     Chief Complaint  Patient presents with  . Fall     (Consider location/radiation/quality/duration/timing/severity/associated sxs/prior Treatment) HPI [facility] witnessed fall from standing. Per caretaker patient's been more agitated than normal over the last day. Pulses pounds fell backwards striking the back of his head. There was no loss of consciousness. Patient has advanced Alzheimer's and is unable to contribute to history. Level V caveat applies. Last tetanus updated 1/16. Past Medical History  Diagnosis Date  . Hypertension   . Gout   . Hypercholesteremia   . Arthritis   . Acid reflux   . Cancer     Cancer-prostate  . Edema   . Psoriasis   . Alzheimer disease    Past Surgical History  Procedure Laterality Date  . Cataract extraction w/phaco  07/01/2012    Procedure: CATARACT EXTRACTION PHACO AND INTRAOCULAR LENS PLACEMENT (IOC);  Surgeon: Tonny Branch, MD;  Location: [facility];  Service: Ophthalmology;  Laterality: Right;  CDE 19.91  . Cataract extraction w/phaco  09/02/2012    Procedure: CATARACT EXTRACTION PHACO AND INTRAOCULAR LENS PLACEMENT (IOC);  Surgeon: Tonny Branch, MD;  Location: [facility];  Service: Ophthalmology;  Laterality: Left;  CDE 16.23  . Tonsillectomy    . Dilatation of esophageal stricture     Family History  Problem Relation Age of Onset  . Heart attack Mother   . Stroke Father    History  Substance Use Topics  . Smoking status: Never Smoker   . Smokeless tobacco: Never Used  . Alcohol Use: Yes     Comment: 2 glasses of wine daily    Review of Systems  Unable to perform ROS: Dementia      Allergies  Review of patient's allergies indicates no known allergies.  Home Medications   Prior to Admission medications   Medication Sig Start Date End Date Taking? Authorizing Provider   acetaminophen (TYLENOL) 325 MG tablet Take 650 mg by mouth every 6 (six) hours as needed for moderate pain (pain).     Historical Provider, MD  cephALEXin (KEFLEX) 500 MG capsule Take 1 capsule (500 mg total) by mouth 4 (four) times daily. 04/12/15   Orpah Greek, MD  chlorproMAZINE (THORAZINE) 25 MG tablet Take 25 mg by mouth every 6 (six) hours as needed (for hiccups).    Historical Provider, MD  donepezil (ARICEPT) 10 MG tablet Take 10 mg by mouth daily with breakfast.    Historical Provider, MD  ENSURE (ENSURE) Take 237 mLs by mouth daily with breakfast.    Historical Provider, MD  etodolac (LODINE) 400 MG tablet Take 400 mg by mouth daily with breakfast.     Historical Provider, MD  furosemide (LASIX) 20 MG tablet Take 20 mg by mouth daily with breakfast.     Historical Provider, MD  geriatric multivitamins-minerals (ELDERTONIC/GEVRABON) ELIX Take 15 mLs by mouth 3 (three) times daily.    Historical Provider, MD  lisinopril (PRINIVIL,ZESTRIL) 20 MG tablet Take 20 mg by mouth daily after breakfast.     Historical Provider, MD  loperamide (IMODIUM) 2 MG capsule Take 2 mg by mouth every 4 (four) hours as needed for diarrhea or loose stools (diarrhea).     Historical Provider, MD  LORazepam (ATIVAN) 0.5 MG tablet TAKE 1 TABLET BY MOUTH EVERY EIGHT HOURS AS NEEDED FOR ANXIETY.  Patient taking differently: TAKE 1 TABLET BY MOUTH EVERY DAILY AT 4 PM 12/04/14   Asencion Partridge Dohmeier, MD  LORazepam (ATIVAN) 0.5 MG tablet Take 0.5 mg by mouth every 6 (six) hours as needed.     Historical Provider, MD  Multiple Vitamins-Minerals (I-VITE) TABS Take 1 tablet by mouth daily with breakfast.    Historical Provider, MD  omeprazole (PRILOSEC) 20 MG capsule Take 20 mg by mouth daily with breakfast.     Historical Provider, MD  probenecid (BENEMID) 500 MG tablet Take 500 mg by mouth daily with breakfast.     Historical Provider, MD  risperiDONE (RISPERDAL) 0.25 MG tablet Take 1 tablet (0.25 mg total) by mouth 2  (two) times daily. 01/02/15   Ward Givens, NP   BP 97/56 mmHg  Pulse 65  Temp(Src) 98.4 F (36.9 C) (Oral)  Resp 18  SpO2 100% Physical Exam  Constitutional: He appears well-developed and well-nourished. No distress.  HENT:  Head: Normocephalic.  Mouth/Throat: Oropharynx is clear and moist.  Irregular laceration to the occiput. There is no active bleeding.  Eyes: EOM are normal. Pupils are equal, round, and reactive to light.  Neck: Normal range of motion. Neck supple.  Makeshift cervical collar of rolled towels placed by EMS. No obvious trauma to the neck.  Cardiovascular: Normal rate and regular rhythm.   Pulmonary/Chest: Effort normal and breath sounds normal. No respiratory distress. He has no wheezes. He has no rales.  Abdominal: Soft. Bowel sounds are normal. He exhibits no distension and no mass. There is no tenderness. There is no rebound and no guarding.  Musculoskeletal: Normal range of motion. He exhibits no edema or tenderness.  Pelvis stable. Motion of all joints. Distal pulses intact.  Neurological: He is alert.  Moving all extremities. Slightly combative. Not oriented to person, place or time.  Skin: Skin is warm and dry. No rash noted. No erythema.  Nursing note and vitals reviewed.   ED Course  LACERATION REPAIR Date/Time: 05/13/2015 5:47 AM Performed by: Julianne Rice Authorized by: Lita Mains, Donasia Wimes Consent: The procedure was performed in an emergent situation. Body area: head/neck Location details: scalp Laceration length: 4 cm Foreign bodies: no foreign bodies Tendon involvement: none Nerve involvement: none Vascular damage: no Irrigation solution: saline Irrigation method: syringe Amount of cleaning: standard Skin closure: staples Number of sutures: 5 Technique: simple Approximation: close Approximation difficulty: simple Dressing: 4x4 sterile gauze and gauze roll Patient tolerance: Patient tolerated the procedure well with no immediate  complications Comments: 4 cm irregular laceration with macerated tissue repaired with staples   (including critical care time) Labs Review Labs Reviewed  CBC WITH DIFFERENTIAL/PLATELET - Abnormal; Notable for the following:    RBC 3.56 (*)    Hemoglobin 11.4 (*)    HCT 35.3 (*)    Neutrophils Relative % 83 (*)    Lymphocytes Relative 7 (*)    Lymphs Abs 0.6 (*)    All other components within normal limits  COMPREHENSIVE METABOLIC PANEL - Abnormal; Notable for the following:    Glucose, Bld 110 (*)    BUN 51 (*)    Creatinine, Ser 1.49 (*)    Total Protein 6.2 (*)    Albumin 3.3 (*)    GFR calc non Af Amer 41 (*)    GFR calc Af Amer 48 (*)    All other components within normal limits    Imaging Review Ct Head Wo Contrast  05/13/2015   CLINICAL DATA:  [place] tonight, striking the back of his  head.  EXAM: CT HEAD WITHOUT CONTRAST  CT CERVICAL SPINE WITHOUT CONTRAST  TECHNIQUE: Multidetector CT imaging of the head and cervical spine was performed following the standard protocol without intravenous contrast. Multiplanar CT image reconstructions of the cervical spine were also generated.  COMPARISON:  04/14/2015  FINDINGS: CT HEAD FINDINGS  There is no intracranial hemorrhage or extra-axial fluid collection. There is severe generalized atrophy. There is white matter hypodensity consistent with chronic small vessel ischemic disease. There is chronic right occipital infarction and likely a much smaller area of chronic infarction in the left occipital lobe. No superimposed acute findings are evident. Skull base and calvarium are intact.  CT CERVICAL SPINE FINDINGS  The vertebral column, pedicles and facet articulations are intact. There is no evidence of acute fracture. No acute soft tissue abnormalities are evident.  Moderate degenerative disc changes are present at C5-6 and C6-7. There is no bone lesion or bony destruction.  IMPRESSION: 1. Negative for acute intracranial traumatic injury. 2. Severe  generalized atrophy and chronic small vessel ischemic disease. Chronic occipital infarction on the right and possibly also on the left. 3. Negative for acute cervical spine fracture   Electronically Signed   By: Andreas Newport M.D.   On: 05/13/2015 03:52   Ct Cervical Spine Wo Contrast  05/13/2015   CLINICAL DATA:  [place] tonight, striking the back of his head.  EXAM: CT HEAD WITHOUT CONTRAST  CT CERVICAL SPINE WITHOUT CONTRAST  TECHNIQUE: Multidetector CT imaging of the head and cervical spine was performed following the standard protocol without intravenous contrast. Multiplanar CT image reconstructions of the cervical spine were also generated.  COMPARISON:  04/14/2015  FINDINGS: CT HEAD FINDINGS  There is no intracranial hemorrhage or extra-axial fluid collection. There is severe generalized atrophy. There is white matter hypodensity consistent with chronic small vessel ischemic disease. There is chronic right occipital infarction and likely a much smaller area of chronic infarction in the left occipital lobe. No superimposed acute findings are evident. Skull base and calvarium are intact.  CT CERVICAL SPINE FINDINGS  The vertebral column, pedicles and facet articulations are intact. There is no evidence of acute fracture. No acute soft tissue abnormalities are evident.  Moderate degenerative disc changes are present at C5-6 and C6-7. There is no bone lesion or bony destruction.  IMPRESSION: 1. Negative for acute intracranial traumatic injury. 2. Severe generalized atrophy and chronic small vessel ischemic disease. Chronic occipital infarction on the right and possibly also on the left. 3. Negative for acute cervical spine fracture   Electronically Signed   By: Andreas Newport M.D.   On: 05/13/2015 03:52     EKG Interpretation None      MDM   Final diagnoses:  Fall from standing, initial encounter  Scalp laceration, initial encounter    Patient presents status post fall from standing and  scalp laceration. CT head and cervical spine without any acute findings. The wound was irrigated and repaired in the emergency department with staples. Staples will need to be removed and 7 days. Will discharge back to nursing home. He is at his mental baseline per nursing home staff.    Julianne Rice, MD 05/13/15 [phone]

## 2015-06-19 ENCOUNTER — Encounter (HOSPITAL_COMMUNITY): Payer: Self-pay | Admitting: Emergency Medicine

## 2015-06-19 ENCOUNTER — Emergency Department (HOSPITAL_COMMUNITY): Payer: Medicare Other

## 2015-06-19 ENCOUNTER — Inpatient Hospital Stay (HOSPITAL_COMMUNITY)
Admission: EM | Admit: 2015-06-19 | Discharge: 2015-06-21 | DRG: 690 | Disposition: A | Discharge status: Home / Self Care | Payer: Medicare Other | Attending: Internal Medicine | Admitting: Internal Medicine

## 2015-06-19 DIAGNOSIS — K219 Gastro-esophageal reflux disease without esophagitis: Secondary | ICD-10-CM | POA: Diagnosis present

## 2015-06-19 DIAGNOSIS — I10 Essential (primary) hypertension: Secondary | ICD-10-CM | POA: Diagnosis not present

## 2015-06-19 DIAGNOSIS — N179 Acute kidney failure, unspecified: Secondary | ICD-10-CM | POA: Diagnosis present

## 2015-06-19 DIAGNOSIS — E860 Dehydration: Secondary | ICD-10-CM | POA: Diagnosis present

## 2015-06-19 DIAGNOSIS — S0181XA Laceration without foreign body of other part of head, initial encounter: Secondary | ICD-10-CM | POA: Diagnosis present

## 2015-06-19 DIAGNOSIS — W19XXXA Unspecified fall, initial encounter: Secondary | ICD-10-CM | POA: Diagnosis present

## 2015-06-19 DIAGNOSIS — R296 Repeated falls: Secondary | ICD-10-CM

## 2015-06-19 DIAGNOSIS — S060X0A Concussion without loss of consciousness, initial encounter: Secondary | ICD-10-CM

## 2015-06-19 DIAGNOSIS — G309 Alzheimer's disease, unspecified: Secondary | ICD-10-CM | POA: Diagnosis not present

## 2015-06-19 DIAGNOSIS — N390 Urinary tract infection, site not specified: Secondary | ICD-10-CM | POA: Diagnosis present

## 2015-06-19 DIAGNOSIS — Z79899 Other long term (current) drug therapy: Secondary | ICD-10-CM | POA: Diagnosis not present

## 2015-06-19 DIAGNOSIS — M109 Gout, unspecified: Secondary | ICD-10-CM | POA: Diagnosis present

## 2015-06-19 DIAGNOSIS — F0280 Dementia in other diseases classified elsewhere without behavioral disturbance: Secondary | ICD-10-CM | POA: Diagnosis present

## 2015-06-19 DIAGNOSIS — M1A9XX0 Chronic gout, unspecified, without tophus (tophi): Secondary | ICD-10-CM | POA: Diagnosis not present

## 2015-06-19 DIAGNOSIS — C61 Malignant neoplasm of prostate: Secondary | ICD-10-CM | POA: Diagnosis not present

## 2015-06-19 DIAGNOSIS — R001 Bradycardia, unspecified: Secondary | ICD-10-CM | POA: Diagnosis present

## 2015-06-19 DIAGNOSIS — E785 Hyperlipidemia, unspecified: Secondary | ICD-10-CM | POA: Diagnosis present

## 2015-06-19 DIAGNOSIS — E875 Hyperkalemia: Secondary | ICD-10-CM | POA: Diagnosis present

## 2015-06-19 DIAGNOSIS — Z66 Do not resuscitate: Secondary | ICD-10-CM | POA: Diagnosis present

## 2015-06-19 DIAGNOSIS — M1990 Unspecified osteoarthritis, unspecified site: Secondary | ICD-10-CM | POA: Diagnosis present

## 2015-06-19 DIAGNOSIS — D649 Anemia, unspecified: Secondary | ICD-10-CM | POA: Diagnosis not present

## 2015-06-19 DIAGNOSIS — E780 Pure hypercholesterolemia: Secondary | ICD-10-CM | POA: Diagnosis present

## 2015-06-19 DIAGNOSIS — F0391 Unspecified dementia with behavioral disturbance: Secondary | ICD-10-CM | POA: Diagnosis present

## 2015-06-19 DIAGNOSIS — F03918 Unspecified dementia, unspecified severity, with other behavioral disturbance: Secondary | ICD-10-CM | POA: Diagnosis present

## 2015-06-19 DIAGNOSIS — Z8719 Personal history of other diseases of the digestive system: Secondary | ICD-10-CM

## 2015-06-19 LAB — URINALYSIS, ROUTINE W REFLEX MICROSCOPIC
Bilirubin Urine: NEGATIVE
GLUCOSE, UA: NEGATIVE mg/dL
KETONES UR: NEGATIVE mg/dL
NITRITE: NEGATIVE
Protein, ur: NEGATIVE mg/dL
Specific Gravity, Urine: 1.01 (ref 1.005–1.030)
Urobilinogen, UA: 0.2 mg/dL (ref 0.0–1.0)
pH: 7 (ref 5.0–8.0)

## 2015-06-19 LAB — BASIC METABOLIC PANEL
ANION GAP: 5 (ref 5–15)
Anion gap: 6 (ref 5–15)
BUN: 36 mg/dL — ABNORMAL HIGH (ref 6–20)
BUN: 29 mg/dL — AB (ref 6–20)
CALCIUM: 8.9 mg/dL (ref 8.9–10.3)
CO2: 27 mmol/L (ref 22–32)
CO2: 27 mmol/L (ref 22–32)
CREATININE: 1.29 mg/dL — AB (ref 0.61–1.24)
Calcium: 8.8 mg/dL — ABNORMAL LOW (ref 8.9–10.3)
Chloride: 104 mmol/L (ref 101–111)
Chloride: 108 mmol/L (ref 101–111)
Creatinine, Ser: 1.21 mg/dL (ref 0.61–1.24)
GFR calc Af Amer: >60 mL/min (ref >60)
GFR, EST AFRICAN AMERICAN: 57 mL/min — AB (ref >60)
GFR, EST NON AFRICAN AMERICAN: 49 mL/min — AB (ref >60)
GFR, EST NON AFRICAN AMERICAN: 53 mL/min — AB (ref >60)
GLUCOSE: 106 mg/dL — AB (ref 65–99)
Glucose, Bld: 93 mg/dL (ref 65–99)
POTASSIUM: 5.2 mmol/L — AB (ref 3.5–5.1)
Potassium: 5.7 mmol/L — ABNORMAL HIGH (ref 3.5–5.1)
Sodium: 136 mmol/L (ref 135–145)
Sodium: 141 mmol/L (ref 135–145)

## 2015-06-19 LAB — URINE MICROSCOPIC-ADD ON

## 2015-06-19 LAB — CBC
HCT: 29 % — ABNORMAL LOW (ref 39.0–52.0)
Hemoglobin: 9.3 g/dL — ABNORMAL LOW (ref 13.0–17.0)
MCH: 31.2 pg (ref 26.0–34.0)
MCHC: 32.1 g/dL (ref 30.0–36.0)
MCV: 97.3 fL (ref 78.0–100.0)
Platelets: 252 10*3/uL (ref 150–400)
RBC: 2.98 MIL/uL — ABNORMAL LOW (ref 4.22–5.81)
RDW: 14.6 % (ref 11.5–15.5)
WBC: 7.6 10*3/uL (ref 4.0–10.5)

## 2015-06-19 LAB — TSH: TSH: 2.057 u[IU]/mL (ref 0.350–4.500)

## 2015-06-19 MED ORDER — ENOXAPARIN SODIUM 40 MG/0.4ML ~~LOC~~ SOLN
40.0000 mg | SUBCUTANEOUS | Status: DC
  Administered 2015-06-19 – 2015-06-20 (×2): 40 mg via SUBCUTANEOUS
  Filled 2015-06-19 (×3): qty 0.4

## 2015-06-19 MED ORDER — ONDANSETRON HCL 4 MG/2ML IJ SOLN: 4.0000 mg | Freq: Four times a day (QID) | INTRAMUSCULAR | Status: DC | PRN

## 2015-06-19 MED ORDER — DEXTROSE 5 % IV SOLN
1.0000 g | Freq: Once | INTRAVENOUS | Status: AC
  Administered 2015-06-19: 1 g via INTRAVENOUS
  Filled 2015-06-19: qty 10

## 2015-06-19 MED ORDER — DONEPEZIL HCL 10 MG PO TABS
10.0000 mg | ORAL_TABLET | Freq: Every day | ORAL | Status: DC
  Administered 2015-06-20 – 2015-06-21 (×2): 10 mg via ORAL
  Filled 2015-06-19 (×6): qty 1

## 2015-06-19 MED ORDER — GLUCERNA SHAKE PO LIQD
237.0000 mL | Freq: Two times a day (BID) | ORAL | Status: DC
  Administered 2015-06-19 – 2015-06-21 (×4): 237 mL via ORAL

## 2015-06-19 MED ORDER — QUETIAPINE FUMARATE 25 MG PO TABS
37.5000 mg | ORAL_TABLET | Freq: Every day | ORAL | Status: DC
  Administered 2015-06-19 – 2015-06-20 (×2): 37.5 mg via ORAL
  Filled 2015-06-19 (×3): qty 1

## 2015-06-19 MED ORDER — ALUM & MAG HYDROXIDE-SIMETH 200-200-20 MG/5ML PO SUSP: 30.0000 mL | Freq: Four times a day (QID) | ORAL | Status: DC | PRN

## 2015-06-19 MED ORDER — FAMOTIDINE 20 MG PO TABS
20.0000 mg | ORAL_TABLET | Freq: Two times a day (BID) | ORAL | Status: DC
  Administered 2015-06-19 – 2015-06-21 (×5): 20 mg via ORAL
  Filled 2015-06-19 (×6): qty 1

## 2015-06-19 MED ORDER — TRAZODONE HCL 50 MG PO TABS: 50.0000 mg | ORAL_TABLET | Freq: Every evening | ORAL | Status: DC | PRN

## 2015-06-19 MED ORDER — CEFTRIAXONE SODIUM IN DEXTROSE 20 MG/ML IV SOLN
1.0000 g | INTRAVENOUS | Status: DC
  Administered 2015-06-20: 1 g via INTRAVENOUS
  Filled 2015-06-19 (×2): qty 50

## 2015-06-19 MED ORDER — DIVALPROEX SODIUM 125 MG PO CSDR
125.0000 mg | DELAYED_RELEASE_CAPSULE | Freq: Two times a day (BID) | ORAL | Status: DC
  Administered 2015-06-20 – 2015-06-21 (×4): 125 mg via ORAL
  Filled 2015-06-19 (×5): qty 1

## 2015-06-19 MED ORDER — SODIUM CHLORIDE 0.9 % IV BOLUS (SEPSIS)
500.0000 mL | Freq: Once | INTRAVENOUS | Status: AC
  Administered 2015-06-19: 500 mL via INTRAVENOUS

## 2015-06-19 MED ORDER — ACETAMINOPHEN 650 MG RE SUPP: 650.0000 mg | Freq: Four times a day (QID) | RECTAL | Status: DC | PRN

## 2015-06-19 MED ORDER — CALCIUM CARBONATE-VITAMIN D 500-200 MG-UNIT PO TABS
1.0000 | ORAL_TABLET | Freq: Two times a day (BID) | ORAL | Status: DC
  Administered 2015-06-19 – 2015-06-21 (×5): 1 via ORAL
  Filled 2015-06-19 (×8): qty 1

## 2015-06-19 MED ORDER — SODIUM POLYSTYRENE SULFONATE 15 GM/60ML PO SUSP
30.0000 g | Freq: Once | ORAL | Status: AC
  Administered 2015-06-19: 30 g via ORAL
  Filled 2015-06-19: qty 120

## 2015-06-19 MED ORDER — ONDANSETRON HCL 4 MG/2ML IJ SOLN: 4.0000 mg | Freq: Three times a day (TID) | INTRAMUSCULAR | Status: DC | PRN

## 2015-06-19 MED ORDER — LORAZEPAM 2 MG/ML IJ SOLN
0.5000 mg | Freq: Once | INTRAMUSCULAR | Status: AC
  Administered 2015-06-19: 0.5 mg via INTRAVENOUS
  Filled 2015-06-19: qty 1

## 2015-06-19 MED ORDER — DOCUSATE SODIUM 100 MG PO CAPS
100.0000 mg | ORAL_CAPSULE | Freq: Two times a day (BID) | ORAL | Status: DC
  Filled 2015-06-19 (×6): qty 1

## 2015-06-19 MED ORDER — FERROUS SULFATE 325 (65 FE) MG PO TABS
325.0000 mg | ORAL_TABLET | Freq: Two times a day (BID) | ORAL | Status: DC
  Administered 2015-06-19 – 2015-06-21 (×4): 325 mg via ORAL
  Filled 2015-06-19 (×6): qty 1

## 2015-06-19 MED ORDER — ONDANSETRON HCL 4 MG PO TABS: 4.0000 mg | ORAL_TABLET | Freq: Four times a day (QID) | ORAL | Status: DC | PRN

## 2015-06-19 MED ORDER — DIVALPROEX SODIUM 125 MG PO CSDR
250.0000 mg | DELAYED_RELEASE_CAPSULE | Freq: Every day | ORAL | Status: DC
  Administered 2015-06-19 – 2015-06-20 (×2): 250 mg via ORAL
  Filled 2015-06-19 (×3): qty 2

## 2015-06-19 MED ORDER — VITAMIN D3 25 MCG (1000 UNIT) PO TABS
2000.0000 [IU] | ORAL_TABLET | Freq: Every day | ORAL | Status: DC
  Administered 2015-06-19 – 2015-06-21 (×3): 2000 [IU] via ORAL
  Filled 2015-06-19 (×4): qty 2

## 2015-06-19 MED ORDER — CHLORPROMAZINE HCL 25 MG PO TABS
25.0000 mg | ORAL_TABLET | Freq: Four times a day (QID) | ORAL | Status: DC | PRN
  Filled 2015-06-19: qty 1

## 2015-06-19 MED ORDER — ACETAMINOPHEN 325 MG PO TABS: 650.0000 mg | ORAL_TABLET | Freq: Four times a day (QID) | ORAL | Status: DC | PRN

## 2015-06-19 MED ORDER — SODIUM CHLORIDE 0.9 % IV SOLN
INTRAVENOUS | Status: DC
  Administered 2015-06-19 – 2015-06-21 (×6): via INTRAVENOUS

## 2015-06-19 MED ORDER — SODIUM CHLORIDE 0.9 % IV BOLUS (SEPSIS): 500.0000 mL | Freq: Once | INTRAVENOUS | Status: AC

## 2015-06-19 MED ORDER — LORAZEPAM 0.5 MG PO TABS
0.5000 mg | ORAL_TABLET | Freq: Two times a day (BID) | ORAL | Status: DC
  Administered 2015-06-19 – 2015-06-21 (×5): 0.5 mg via ORAL
  Filled 2015-06-19 (×5): qty 1

## 2015-06-19 MED ORDER — SODIUM CHLORIDE 0.9 % IV BOLUS (SEPSIS)
1000.0000 mL | Freq: Once | INTRAVENOUS | Status: AC
  Administered 2015-06-19: 1000 mL via INTRAVENOUS

## 2015-06-19 MED ORDER — DIVALPROEX SODIUM 125 MG PO CSDR: 125.0000 mg | DELAYED_RELEASE_CAPSULE | ORAL | Status: DC

## 2015-06-19 MED ORDER — ELDERTONIC PO ELIX
15.0000 mL | ORAL_SOLUTION | Freq: Three times a day (TID) | ORAL | Status: DC
  Administered 2015-06-19 – 2015-06-21 (×6): 15 mL via ORAL
  Filled 2015-06-19 (×8): qty 15

## 2015-06-19 NOTE — ED Notes (Signed)
Will give ativan after patient's BP increases.  Currently BP is 91/47, IVF started

## 2015-06-19 NOTE — Progress Notes (Signed)
Pt had a 2.08sec pause, pt asleep with no complaints. Paged oncall NP. Awaiting callback. Will continue to monitor pt and make next shift RN aware.

## 2015-06-19 NOTE — H&P (Signed)
[facility] History and Physical                                                                                    William Ramos, is a 81 y.o. male  MRN: [id number]   DOB - [birth year]  Admit Date - 06/19/2015  Outpatient Primary MD for the patient is Asencion Noble, MD  Referring MD: [facility] / ER  With History of -  Past Medical History  Diagnosis Date  . Hypertension   . Gout   . Hypercholesteremia   . Arthritis   . Acid reflux   . Cancer     Cancer-prostate  . Edema   . Psoriasis   . Alzheimer disease       Past Surgical History  Procedure Laterality Date  . Cataract extraction w/phaco  07/01/2012    Procedure: CATARACT EXTRACTION PHACO AND INTRAOCULAR LENS PLACEMENT (IOC);  Surgeon: Tonny Branch, MD;  Location: [facility];  Service: Ophthalmology;  Laterality: Right;  CDE 19.91  . Cataract extraction w/phaco  09/02/2012    Procedure: CATARACT EXTRACTION PHACO AND INTRAOCULAR LENS PLACEMENT (IOC);  Surgeon: Tonny Branch, MD;  Location: [facility];  Service: Ophthalmology;  Laterality: Left;  CDE 16.23  . Tonsillectomy    . Dilatation of esophageal stricture      in for   Chief Complaint  Patient presents with  . Fall     HPI This is a 81 year old male resident of a memory care unit presents to the hospital after experiencing a fall. Patient has been in the emergency department multiple occasions over the past 12 months for falls. Patient has a history of advanced dementia, hypertension, dyslipidemia, gout as well. The patient had an apparent unwitnessed fall from a sitting position around 7 AM this morning. Staff assisted patient upright and he was able to walk about difficulty. Baseline mentation is confused with episodic bouts of agitation and combativeness. Patient was without complaints in the ER but had observed left hand swelling recently at the facility and prior to this fall and plain films obtained at the nursing facility were negative. Because of his dementia patient is  unable to contribute to the history portion of the exam  In the ER patient was afebrile, sinus bradycardia with pulse rate primarily in the mid 40s occasionally up into the 60s which is baseline for him, systolic blood pressure low for him at 91/45. He was maintaining room air sats 100%. Because his blood pressure was soft T was given a total of 1.5 L by the ER physician. Cc the head and cervical spine were unrevealing for any bony injury but did reveal soft tissue edema and hematoma overlying the left frontal bone consistent with observed vertical abrasions and contusion. A left hand x-ray was also completed which revealed extensive osteo arthritic changes in all the joints without any acute fracture or dislocation; suspected chronic triangular fibrocartilage region tear with calcification. Labs were consistent with volume depletion with a potassium of 5.2, BUN 36 and creatinine 1.29. Urinalysis was abnormal. Consistent with finding depletion as well as likely urinary tract infection. Urine cultures been obtained in the ER patient has been given a dose  of Rocephin. White count was normal except for hemoglobin of 9.3 with a normal MCV.   Review of Systems   **Unable to obtain from patient due to degree of underlying dementia  *A full 10 point Review of Systems was done, except as stated above, all other Review of Systems were negative.  Social History History  Substance Use Topics  . Smoking status: Never Smoker   . Smokeless tobacco: Never Used  . Alcohol Use: Yes     Comment: 2 glasses of wine daily    Resides at: [facility] in the memory care unit  Lives with: N/A  Ambulatory status: Assisted by staff with history of frequent falls   Family History **Information previously entered into the medical record Family History  Problem Relation Age of Onset  . Heart attack Mother   . Stroke Father      Prior to Admission medications   Medication Sig Start Date End  Date Taking? Authorizing Provider  calcium-vitamin D (OSCAL WITH D) 500-200 MG-UNIT per tablet Take 1 tablet by mouth 2 (two) times daily.   Yes Historical Provider, MD  Cholecalciferol (VITAMIN D) 2000 UNITS CAPS Take 2,000 Units by mouth daily.   Yes Historical Provider, MD  divalproex (DEPAKOTE SPRINKLE) 125 MG capsule Take 125 mg by mouth See admin instructions. 125mg  at 8am and at 12pm and 250mg  at 8pm   Yes Historical Provider, MD  donepezil (ARICEPT) 10 MG tablet Take 10 mg by mouth daily with breakfast.   Yes Historical Provider, MD  famotidine (PEPCID) 20 MG tablet Take 20 mg by mouth 2 (two) times daily.   Yes Historical Provider, MD  feeding supplement, GLUCERNA SHAKE, (GLUCERNA SHAKE) LIQD Take 237 mLs by mouth 2 (two) times daily.   Yes Historical Provider, MD  ferrous sulfate 325 (65 FE) MG tablet Take 325 mg by mouth 2 (two) times daily with a meal.   Yes Historical Provider, MD  geriatric multivitamins-minerals (ELDERTONIC/GEVRABON) ELIX Take 15 mLs by mouth 3 (three) times daily.   Yes Historical Provider, MD  lisinopril (PRINIVIL,ZESTRIL) 5 MG tablet Take 5 mg by mouth daily.   Yes Historical Provider, MD  loperamide (IMODIUM) 2 MG capsule Take 2 mg by mouth every 4 (four) hours as needed for diarrhea or loose stools (diarrhea).    Yes Historical Provider, MD  LORazepam (ATIVAN) 0.5 MG tablet TAKE 1 TABLET BY MOUTH EVERY EIGHT HOURS AS NEEDED FOR ANXIETY. Patient taking differently: TAKE 1 TABLET BY MOUTH TWICE DAILY 12/04/14  Yes Larey Seat, MD  LORazepam (ATIVAN) 0.5 MG tablet Take 0.5 mg by mouth every 6 (six) hours as needed for anxiety.    Yes Historical Provider, MD  Melatonin 3 MG CAPS Take 6 mg by mouth at bedtime.   Yes Historical Provider, MD  Multiple Vitamins-Minerals (I-VITE) TABS Take 1 tablet by mouth daily with breakfast.   Yes Historical Provider, MD  probenecid (BENEMID) 500 MG tablet Take 500 mg by mouth daily with breakfast.    Yes Historical Provider, MD    QUEtiapine (SEROQUEL) 25 MG tablet Take 37.5 mg by mouth at bedtime.   Yes Historical Provider, MD  traZODone (DESYREL) 50 MG tablet Take 50 mg by mouth at bedtime as needed for sleep.   Yes Historical Provider, MD  acetaminophen (TYLENOL) 325 MG tablet Take 650 mg by mouth every 6 (six) hours as needed for moderate pain (pain).     Historical Provider, MD  chlorproMAZINE (THORAZINE) 25 MG tablet Take 25  mg by mouth every 6 (six) hours as needed (for hiccups).    Historical Provider, MD  furosemide (LASIX) 20 MG tablet Take 10 mg by mouth daily as needed for fluid.     Historical Provider, MD  omeprazole (PRILOSEC) 20 MG capsule Take 20 mg by mouth daily with breakfast.     Historical Provider, MD    No Known Allergies  Physical Exam  Vitals  Blood pressure 91/53, pulse 43, temperature 98.1 F (36.7 C), temperature source Oral, resp. rate 12, height 5\' 7"  (1.702 m), weight 170 lb (77.111 kg), SpO2 100 %.   General:  In no acute distress, pierced stated age  Psych:  Normal affect, Awake Alert, Oriented X only-currently not agitated or aggressive  Neuro:   No focal neurological deficits except for underlying chronic dementia changes, CN II through XII intact, Strength 5/5 all 4 extremities, Sensation intact all 4 extremities.  ENT:  Ears and Eyes appear Normal, Conjunctivae clear, PER. Moist oral mucosa without erythema or exudates.  Neck:  Supple, No lymphadenopathy appreciated  Respiratory:  Symmetrical chest wall movement, Good air movement bilaterally, CTAB. Room Air  Cardiac:  RRR with occasional bradycardic rate, No Murmurs, no LE edema noted, no JVD, No carotid bruits, peripheral pulses palpable at 2+  Abdomen:  Positive bowel sounds, Soft, Non tender, Non distended,  No masses appreciated, no obvious hepatosplenomegaly  Skin:  No Cyanosis, Normal Skin Turgor, No Skin Rash, noted small contusion left anterior 4 head measuring about 2.5 x 2.5 cm with 2 vertical linear  abrasions  Extremities: Symmetrical without obvious trauma or injury,  no effusions. Some minor left hand swelling  Data Review  CBC  Recent Labs Lab 06/19/15 0901  WBC 7.6  HGB 9.3*  HCT 29.0*  PLT 252  MCV 97.3  MCH 31.2  MCHC 32.1  RDW 14.6    Chemistries   Recent Labs Lab 06/19/15 0901  NA 136  K 5.2*  CL 104  CO2 27  GLUCOSE 93  BUN 36*  CREATININE 1.29*  CALCIUM 8.9    estimated creatinine clearance is 39.1 mL/min (by C-G formula based on Cr of 1.29).  No results for input(s): TSH, T4TOTAL, T3FREE, THYROIDAB in the last 72 hours.  Invalid input(s): FREET3  Coagulation profile No results for input(s): INR, PROTIME in the last 168 hours.  No results for input(s): DDIMER in the last 72 hours.  Cardiac Enzymes No results for input(s): CKMB, TROPONINI, MYOGLOBIN in the last 168 hours.  Invalid input(s): CK  Invalid input(s): POCBNP  Urinalysis    Component Value Date/Time   COLORURINE YELLOW 06/19/2015 1110   APPEARANCEUR TURBID* 06/19/2015 1110   LABSPEC 1.010 06/19/2015 1110   PHURINE 7.0 06/19/2015 1110   GLUCOSEU NEGATIVE 06/19/2015 1110   HGBUR MODERATE* 06/19/2015 1110   BILIRUBINUR NEGATIVE 06/19/2015 1110   KETONESUR NEGATIVE 06/19/2015 1110   PROTEINUR NEGATIVE 06/19/2015 1110   UROBILINOGEN 0.2 06/19/2015 1110   NITRITE NEGATIVE 06/19/2015 1110   LEUKOCYTESUR LARGE* 06/19/2015 1110    Imaging results:   Ct Head Wo Contrast  06/19/2015   CLINICAL DATA:  Fall at nursing home, hit head on floor. Hematoma/ laceration to frontal area. History of dementia.  EXAM: CT HEAD WITHOUT CONTRAST  CT CERVICAL SPINE WITHOUT CONTRAST  TECHNIQUE: Multidetector CT imaging of the head and cervical spine was performed following the standard protocol without intravenous contrast. Multiplanar CT image reconstructions of the cervical spine were also generated.  COMPARISON:  Head and cervical spine  CT dated 05/13/2015.  FINDINGS: CT HEAD FINDINGS  There is  generalized brain atrophy with commensurate dilatation of the ventricles and sulci, unchanged. Again noted are chronic small-vessel ischemic changes within the bilateral periventricular and subcortical white matter regions, unchanged. 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  There is no mass, hemorrhage, edema, or other evidence of acute parenchymal abnormality. No extra-axial hemorrhage. Soft tissue edema/hematoma overlying the lower left frontal bone measures 6 mm thickness. No underlying osseous fracture or displacement. Old nasal bone fractures again incidentally noted.  CT CERVICAL SPINE FINDINGS  Moderate degenerative change again noted throughout the mid and lower cervical spine with associated disc space narrowings and osseous spurring. Alignment of the cervical spine is stable. Dextroscoliosis is unchanged. There is no fracture line or displaced fracture fragment identified. Facet joints remain appropriately aligned.Paravertebral soft tissues are unremarkable. Mild emphysematous change and scarring noted at each lung apex.  IMPRESSION: Soft tissue edema/hematoma overlying the lower left frontal bone. No underlying fracture. No evidence of acute intracranial abnormality. No intracranial hemorrhage or edema.  No acute fracture or dislocation within the cervical spine. Chronic degenerative changes as detailed above.   Electronically Signed   By: Franki Cabot M.D.   On: 06/19/2015 11:02   Ct Cervical Spine Wo Contrast  06/19/2015   CLINICAL DATA:  Fall at nursing home, hit head on floor. Hematoma/ laceration to frontal area. History of dementia.  EXAM: CT HEAD WITHOUT CONTRAST  CT CERVICAL SPINE WITHOUT CONTRAST  TECHNIQUE: Multidetector CT imaging of the head and cervical spine was performed following the standard protocol without intravenous contrast. Multiplanar CT image reconstructions of the cervical spine were also generated.  COMPARISON:  Head and  cervical spine CT dated 05/13/2015.  FINDINGS: CT HEAD FINDINGS  There is generalized brain atrophy with commensurate dilatation of the ventricles and sulci, unchanged. Again noted are chronic small-vessel ischemic changes within the bilateral periventricular and subcortical white matter regions, unchanged. 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  There is no mass, hemorrhage, edema, or other evidence of acute parenchymal abnormality. No extra-axial hemorrhage. Soft tissue edema/hematoma overlying the lower left frontal bone measures 6 mm thickness. No underlying osseous fracture or displacement. Old nasal bone fractures again incidentally noted.  CT CERVICAL SPINE FINDINGS  Moderate degenerative change again noted throughout the mid and lower cervical spine with associated disc space narrowings and osseous spurring. Alignment of the cervical spine is stable. Dextroscoliosis is unchanged. There is no fracture line or displaced fracture fragment identified. Facet joints remain appropriately aligned.Paravertebral soft tissues are unremarkable. Mild emphysematous change and scarring noted at each lung apex.  IMPRESSION: Soft tissue edema/hematoma overlying the lower left frontal bone. No underlying fracture. No evidence of acute intracranial abnormality. No intracranial hemorrhage or edema.  No acute fracture or dislocation within the cervical spine. Chronic degenerative changes as detailed above.   Electronically Signed   By: Franki Cabot M.D.   On: 06/19/2015 11:02   Dg Hand Complete Left  06/19/2015   CLINICAL DATA:  Pain following fall  EXAM: LEFT HAND - COMPLETE 3+ VIEW  COMPARISON:  None.  FINDINGS: Frontal, oblique, and lateral views were obtained. There is no demonstrable fracture or dislocation. There is osteoarthritic change in essentially all joints in the hand and wrist. No bony destruction. There is calcification in the triangular fibrocartilage.   IMPRESSION: Extensive osteoarthritic change in essentially all joints. No acute fracture or dislocation.  Suspect chronic triangular fibrocartilage region tear with calcification.   Electronically Signed   By: Lowella Grip III M.D.   On: 06/19/2015 10:50     EKG: (Independently reviewed) sinus bradycardia normal QTC no ischemic changes   Assessment & Plan  Principal Problem:   Acute UTI/Urinary tract infection in elderly patient -Admit to telemetry -Follow-up urine culture -Empiric Rocephin -No fever or leukocytosis so had not obtain blood cultures at this juncture  Active Problems:   Dehydration, mild -Likely related to underlying dementia -IV fluids at 100 mL per hour    Acute kidney injury/Acute hyperkalemia -Baseline renal function April 2016 normal with BUN 27 and creatinine 0.86 -Current renal function BUN 36 and creatinine 1.29 -Hold ACE inhibitor -Has when necessary Lasix available based on weight gain but suspect given dementia and probably chronic poor oral intake may need to probably discontinue this medication -Repeat electrolyte panel at 4 PM after adequate IV hydration to determine if potassium has returned to baseline noting only minimally elevated at 5.2      Frequent falls -Resident of memory care unit -PT/OTevaluation    History of diverticulosis -On exam no abdominal pain and no reported bleeding    HTN  -Blood pressure currently soft and setting of acute kidney injury ACE inhibitor on hold as well    Chronic sinus bradycardia -Baseline heart rate at rest can be in the 40s but patient is able to increase heart rate into the 60s if needed -Not on rate controlling agents prior to admission -Monitor on telemetry in the event arrhythmia contributing to frequent falls    Dementia with behavioral disturbance -Continue preadmission medications (Aricept, scheduled Ativan twice a day, Seroquel, and trazodone) -Sitter at [employer] pays for private sitter  who will continue during this admission -Fall precautions    Normocytic anemia -Hemoglobin appears stable and at baseline -Continue preadmission iron -Check TSH    Osteoarthritis/ Gout -No evidence of acute exacerbation -Continue Tylenol and probenecid    HLD  -Not on medications prior to admission    DVT Prophylaxis: Lovenox  Family Communication:   Wife at bedside  Code Status:  Full code  Condition:  Stable  Discharge disposition: Anticipate discharge back to assisted living facility/memory care unit after resolution of dehydration/acute kidney injury-is pending PT/OT evaluation in the event patient needs to upgrade to skilled nursing facility  Time spent in minutes : 60      Saaya Procell L. ANP on 06/19/2015 at 12:59 PM  Between 7am to 7pm - Pager - [phone]  After 7pm go to [email or web address] - password [email or web address]  And look for the night coverage person covering me after hours  [facility]

## 2015-06-19 NOTE — Progress Notes (Signed)
Pt. Arrived to the unit via stretcher accompanied by wife and private caregiver. Pt. Is only alert to self and with no signs of distress noted. Pt. Vitals appear stable. Bruising on nose, head hematoma and scattered abrasions from fall noted. Pt. Is from [facility] unit and has 24 hr private caregivers that will accompany him during his stay. Orders released. No further needs noted at this time.

## 2015-06-19 NOTE — ED Notes (Addendum)
Per EMS: with staff at facil;ity, pt got up from wheelchair hit head on laminate floor, no loc, staff had pt up and walking bc he had gotten blood on them, hematoma to front of head, to lac/skin tears on hematoma, laceration to bridge nose, skin tear to right hand.  Left hand is currently swollen but has been for awhile, has had xrays.  Hx of combativeness, dementia, and is currently at his cognitive baseline.  Sitter on way.  C-collar in place bc pt could not pass [facility]. BP 96 palpated, 60 HR, cbg 126.   [facility].     Update: Sitter now at bedside, reports that previous shift's sitter told her that he was dizzy getting out of bed and fell and hit his head.  Syncope protocols placed.  Patient is now hypotensive as well at 91/45.

## 2015-06-19 NOTE — ED Provider Notes (Signed)
CSN: [id number]     Arrival date & time 06/19/15  3810 History   First MD Initiated Contact with Patient 06/19/15 [phone]     Chief Complaint  Patient presents with  . Fall     (Consider location/radiation/quality/duration/timing/severity/associated sxs/prior Treatment) HPI Comments: 81yo M w/ PMH including advanced dementia, HTN, HLD, gout, frequent falls who p/w fall. History limited due to the patient's dementia and obtained primarily from EMS and the patient's care providers. The patient was at his nursing facility this morning and at approximately 7 AM had an unwitnessed fall from sitting. Staff found patient and were able to get him up off the floor and walking at the facility. They report that he has been at his neurologic baseline since the fall, which is marked by agitation and combativeness. Of note, he has had left hand swelling recently and had plain films that were negative.  Patient is a 81 y.o. male presenting with fall. The history is provided by the EMS personnel and a caregiver. The history is limited by the condition of the patient.  Fall    Past Medical History  Diagnosis Date  . Hypertension   . Gout   . Hypercholesteremia   . Arthritis   . Acid reflux   . Cancer     Cancer-prostate  . Edema   . Psoriasis   . Alzheimer disease    Past Surgical History  Procedure Laterality Date  . Cataract extraction w/phaco  07/01/2012    Procedure: CATARACT EXTRACTION PHACO AND INTRAOCULAR LENS PLACEMENT (IOC);  Surgeon: Tonny Branch, MD;  Location: [facility];  Service: Ophthalmology;  Laterality: Right;  CDE 19.91  . Cataract extraction w/phaco  09/02/2012    Procedure: CATARACT EXTRACTION PHACO AND INTRAOCULAR LENS PLACEMENT (IOC);  Surgeon: Tonny Branch, MD;  Location: [facility];  Service: Ophthalmology;  Laterality: Left;  CDE 16.23  . Tonsillectomy    . Dilatation of esophageal stricture     Family History  Problem Relation Age of Onset  . Heart attack Mother   . Stroke Father     History  Substance Use Topics  . Smoking status: Never Smoker   . Smokeless tobacco: Never Used  . Alcohol Use: Yes     Comment: 2 glasses of wine daily    Review of Systems  Unable to perform ROS: Dementia      Allergies  Review of patient's allergies indicates no known allergies.  Home Medications   Prior to Admission medications   Medication Sig Start Date End Date Taking? Authorizing Provider  calcium-vitamin D (OSCAL WITH D) 500-200 MG-UNIT per tablet Take 1 tablet by mouth 2 (two) times daily.   Yes Historical Provider, MD  Cholecalciferol (VITAMIN D) 2000 UNITS CAPS Take 2,000 Units by mouth daily.   Yes Historical Provider, MD  divalproex (DEPAKOTE SPRINKLE) 125 MG capsule Take 125 mg by mouth See admin instructions. 125mg  at 8am and at 12pm and 250mg  at 8pm   Yes Historical Provider, MD  donepezil (ARICEPT) 10 MG tablet Take 10 mg by mouth daily with breakfast.   Yes Historical Provider, MD  famotidine (PEPCID) 20 MG tablet Take 20 mg by mouth 2 (two) times daily.   Yes Historical Provider, MD  feeding supplement, GLUCERNA SHAKE, (GLUCERNA SHAKE) LIQD Take 237 mLs by mouth 2 (two) times daily.   Yes Historical Provider, MD  ferrous sulfate 325 (65 FE) MG tablet Take 325 mg by mouth 2 (two) times daily with a meal.   Yes Historical  Provider, MD  geriatric multivitamins-minerals (ELDERTONIC/GEVRABON) ELIX Take 15 mLs by mouth 3 (three) times daily.   Yes Historical Provider, MD  lisinopril (PRINIVIL,ZESTRIL) 5 MG tablet Take 5 mg by mouth daily.   Yes Historical Provider, MD  loperamide (IMODIUM) 2 MG capsule Take 2 mg by mouth every 4 (four) hours as needed for diarrhea or loose stools (diarrhea).    Yes Historical Provider, MD  LORazepam (ATIVAN) 0.5 MG tablet TAKE 1 TABLET BY MOUTH EVERY EIGHT HOURS AS NEEDED FOR ANXIETY. Patient taking differently: TAKE 1 TABLET BY MOUTH TWICE DAILY 12/04/14  Yes Larey Seat, MD  LORazepam (ATIVAN) 0.5 MG tablet Take 0.5 mg by mouth  every 6 (six) hours as needed for anxiety.    Yes Historical Provider, MD  Melatonin 3 MG CAPS Take 6 mg by mouth at bedtime.   Yes Historical Provider, MD  Multiple Vitamins-Minerals (I-VITE) TABS Take 1 tablet by mouth daily with breakfast.   Yes Historical Provider, MD  probenecid (BENEMID) 500 MG tablet Take 500 mg by mouth daily with breakfast.    Yes Historical Provider, MD  QUEtiapine (SEROQUEL) 25 MG tablet Take 37.5 mg by mouth at bedtime.   Yes Historical Provider, MD  traZODone (DESYREL) 50 MG tablet Take 50 mg by mouth at bedtime as needed for sleep.   Yes Historical Provider, MD  acetaminophen (TYLENOL) 325 MG tablet Take 650 mg by mouth every 6 (six) hours as needed for moderate pain (pain).     Historical Provider, MD  chlorproMAZINE (THORAZINE) 25 MG tablet Take 25 mg by mouth every 6 (six) hours as needed (for hiccups).    Historical Provider, MD  furosemide (LASIX) 20 MG tablet Take 10 mg by mouth daily as needed for fluid.     Historical Provider, MD  omeprazole (PRILOSEC) 20 MG capsule Take 20 mg by mouth daily with breakfast.     Historical Provider, MD   BP 91/53 mmHg  Pulse 43  Temp(Src) 98.1 F (36.7 C) (Oral)  Resp 12  Ht 5\' 7"  (1.702 m)  Wt 170 lb (77.111 kg)  BMI 26.62 kg/m2  SpO2 100% Physical Exam  Constitutional:  Awake, agitated, combative, yelling obscenities  HENT:  2cm superficial laceration with adjacent skin tear on forehead; abrasion to nasal bridge  Eyes: Conjunctivae are normal. Pupils are equal, round, and reactive to light.  Neck:  In c-collar  Cardiovascular: Normal rate and regular rhythm.   Pulmonary/Chest: Effort normal and breath sounds normal.  Abdominal: Soft. Bowel sounds are normal. He exhibits no distension. There is no tenderness.  Musculoskeletal:  5/5 strength throughout, uniform swelling of L hand with normal ROM elbow and shoulder, no obvious deformity  Neurological:  Disoriented, moves all 4 extremities equally  Skin:  See  HENT; skin tear  R 2nd MCP joint, dorsal surface of hand  Psychiatric:  agitated    ED Course  LACERATION REPAIR Date/Time: 06/19/2015 12:58 PM Performed by: Sharlett Iles Authorized by: Sharlett Iles Consent: Verbal consent not obtained. Written consent not obtained. Body area: head/neck Location details: forehead Laceration length: 2 cm Tendon involvement: none Nerve involvement: none Vascular damage: no Patient sedated: no Irrigation solution: saline Irrigation method: syringe Amount of cleaning: standard Debridement: none Degree of undermining: none Skin closure: glue Approximation: close Approximation difficulty: simple Patient tolerance: Patient tolerated the procedure well with no immediate complications Comments: Cleaned with betadine and repaired with dermabond. Unable to obtain consent 2/2 dementia.   (including critical care time) Labs Review  Labs Reviewed  BASIC METABOLIC PANEL - Abnormal; Notable for the following:    Potassium 5.2 (*)    BUN 36 (*)    Creatinine, Ser 1.29 (*)    GFR calc non Af Amer 49 (*)    GFR calc Af Amer 57 (*)    All other components within normal limits  CBC - Abnormal; Notable for the following:    RBC 2.98 (*)    Hemoglobin 9.3 (*)    HCT 29.0 (*)    All other components within normal limits  URINALYSIS, ROUTINE W REFLEX MICROSCOPIC (NOT AT [facility]) - Abnormal; Notable for the following:    APPearance TURBID (*)    Hgb urine dipstick MODERATE (*)    Leukocytes, UA LARGE (*)    All other components within normal limits  URINE MICROSCOPIC-ADD ON - Abnormal; Notable for the following:    Bacteria, UA FEW (*)    All other components within normal limits  URINE CULTURE  CBG MONITORING, ED    Imaging Review Ct Head Wo Contrast  06/19/2015   CLINICAL DATA:  Fall at nursing home, hit head on floor. Hematoma/ laceration to frontal area. History of dementia.  EXAM: CT HEAD WITHOUT CONTRAST  CT CERVICAL SPINE WITHOUT  CONTRAST  TECHNIQUE: Multidetector CT imaging of the head and cervical spine was performed following the standard protocol without intravenous contrast. Multiplanar CT image reconstructions of the cervical spine were also generated.  COMPARISON:  Head and cervical spine CT dated 05/13/2015.  FINDINGS: CT HEAD FINDINGS  There is generalized brain atrophy with commensurate dilatation of the ventricles and sulci, unchanged. Again noted are chronic small-vessel ischemic changes within the bilateral periventricular and subcortical white matter regions, unchanged. 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  There is no mass, hemorrhage, edema, or other evidence of acute parenchymal abnormality. No extra-axial hemorrhage. Soft tissue edema/hematoma overlying the lower left frontal bone measures 6 mm thickness. No underlying osseous fracture or displacement. Old nasal bone fractures again incidentally noted.  CT CERVICAL SPINE FINDINGS  Moderate degenerative change again noted throughout the mid and lower cervical spine with associated disc space narrowings and osseous spurring. Alignment of the cervical spine is stable. Dextroscoliosis is unchanged. There is no fracture line or displaced fracture fragment identified. Facet joints remain appropriately aligned.Paravertebral soft tissues are unremarkable. Mild emphysematous change and scarring noted at each lung apex.  IMPRESSION: Soft tissue edema/hematoma overlying the lower left frontal bone. No underlying fracture. No evidence of acute intracranial abnormality. No intracranial hemorrhage or edema.  No acute fracture or dislocation within the cervical spine. Chronic degenerative changes as detailed above.   Electronically Signed   By: Franki Cabot M.D.   On: 06/19/2015 11:02   Ct Cervical Spine Wo Contrast  06/19/2015   CLINICAL DATA:  Fall at nursing home, hit head on floor. Hematoma/ laceration to frontal area.  History of dementia.  EXAM: CT HEAD WITHOUT CONTRAST  CT CERVICAL SPINE WITHOUT CONTRAST  TECHNIQUE: Multidetector CT imaging of the head and cervical spine was performed following the standard protocol without intravenous contrast. Multiplanar CT image reconstructions of the cervical spine were also generated.  COMPARISON:  Head and cervical spine CT dated 05/13/2015.  FINDINGS: CT HEAD FINDINGS  There is generalized brain atrophy with commensurate dilatation of the ventricles and sulci, unchanged. Again noted are chronic small-vessel ischemic changes within the bilateral periventricular and subcortical white matter regions, unchanged. Old infarct within the lower right  occipital lobe with associated encephalomalacia is stable.  There is no mass, hemorrhage, edema, or other evidence of acute parenchymal abnormality. No extra-axial hemorrhage. Soft tissue edema/hematoma overlying the lower left frontal bone measures 6 mm thickness. No underlying osseous fracture or displacement. Old nasal bone fractures again incidentally noted.  CT CERVICAL SPINE FINDINGS  Moderate degenerative change again noted throughout the mid and lower cervical spine with associated disc space narrowings and osseous spurring. Alignment of the cervical spine is stable. Dextroscoliosis is unchanged. There is no fracture line or displaced fracture fragment identified. Facet joints remain appropriately aligned.Paravertebral soft tissues are unremarkable. Mild emphysematous change and scarring noted at each lung apex.  IMPRESSION: Soft tissue edema/hematoma overlying the lower left frontal bone. No underlying fracture. No evidence of acute intracranial abnormality. No intracranial hemorrhage or edema.  No acute fracture or dislocation within the cervical spine. Chronic degenerative changes as detailed above.   Electronically Signed   By: Franki Cabot M.D.   On: 06/19/2015 11:02   Dg Hand Complete Left  06/19/2015   CLINICAL DATA:  Pain  following fall  EXAM: LEFT HAND - COMPLETE 3+ VIEW  COMPARISON:  None.  FINDINGS: Frontal, oblique, and lateral views were obtained. There is no demonstrable fracture or dislocation. There is osteoarthritic change in essentially all joints in the hand and wrist. No bony destruction. There is calcification in the triangular fibrocartilage.  IMPRESSION: Extensive osteoarthritic change in essentially all joints. No acute fracture or dislocation. Suspect chronic triangular fibrocartilage region tear with calcification.   Electronically Signed   By: Lowella Grip III M.D.   On: 06/19/2015 10:50     EKG Interpretation   Date/Time:  Tuesday June 19 2015 07:56:41 EDT Ventricular Rate:  54 PR Interval:  196 QRS Duration: 91 QT Interval:  406 QTC Calculation: 385 R Axis:   14 Text Interpretation:  Sinus rhythm Low voltage, extremity leads No  significant change was found Confirmed by Symeon Puleo MD, Harinder Romas ([id number]) on  06/19/2015 8:57:08 AM      MDM   Final diagnoses:  Urinary tract infection in elderly patient  Dehydration, mild  Closed head injury with concussion, without loss of consciousness, initial encounter    81 year old male with past medical history above including advanced dementia and frequent falls who presents with fall that occurred at nursing facility this morning. Patient arrived via EMS awake, agitated, moving all 4 extremities. He had transient hypotension to sBP 90s which corrected with 1L IVF to 110s/50s. EKG on arrival shows no change from previous. Patient placed in c-collar by EMS. Obtained basic labs which show borderline hyperkalemia at 5.2, mild AKI at 1.29 which was present on recent lab work, and stable chronic anemia. UA suggests urinary tract infection; added culture and gave the patient ceftriaxone. Given fall and head trauma, obtained a CT of the head and C-spine as well as plain film of left hand. Gave patient Ativan for agitation with good response.   films negative  for acute injury. I applied Dermabond to wounds after irrigation and cleaning. Given the patient's ongoing mild hypotension and recent increase in falls, I suspect that his symptoms may be due to ongoing UTI. I have admitted the patient for treatment of his infection and ongoing monitoring of his blood pressure.    Sharlett Iles, MD 06/19/15 [phone]

## 2015-06-20 ENCOUNTER — Encounter (HOSPITAL_COMMUNITY): Payer: Self-pay | Admitting: General Practice

## 2015-06-20 DIAGNOSIS — N390 Urinary tract infection, site not specified: Principal | ICD-10-CM

## 2015-06-20 DIAGNOSIS — R001 Bradycardia, unspecified: Secondary | ICD-10-CM

## 2015-06-20 LAB — CBC
HCT: 27 % — ABNORMAL LOW (ref 39.0–52.0)
Hemoglobin: 8.7 g/dL — ABNORMAL LOW (ref 13.0–17.0)
MCH: 31.5 pg (ref 26.0–34.0)
MCHC: 32.2 g/dL (ref 30.0–36.0)
MCV: 97.8 fL (ref 78.0–100.0)
PLATELETS: 210 10*3/uL (ref 150–400)
RBC: 2.76 MIL/uL — AB (ref 4.22–5.81)
RDW: 14.5 % (ref 11.5–15.5)
WBC: 7.7 10*3/uL (ref 4.0–10.5)

## 2015-06-20 LAB — BASIC METABOLIC PANEL
ANION GAP: 7 (ref 5–15)
BUN: 23 mg/dL — ABNORMAL HIGH (ref 6–20)
CHLORIDE: 108 mmol/L (ref 101–111)
CO2: 27 mmol/L (ref 22–32)
Calcium: 8.3 mg/dL — ABNORMAL LOW (ref 8.9–10.3)
Creatinine, Ser: 1.02 mg/dL (ref 0.61–1.24)
GFR calc Af Amer: >60 mL/min (ref >60)
GFR calc non Af Amer: >60 mL/min (ref >60)
GLUCOSE: 81 mg/dL (ref 65–99)
POTASSIUM: 3.9 mmol/L (ref 3.5–5.1)
Sodium: 142 mmol/L (ref 135–145)

## 2015-06-20 LAB — MRSA PCR SCREENING: MRSA by PCR: NEGATIVE

## 2015-06-20 LAB — GLUCOSE, CAPILLARY: Glucose-Capillary: 123 mg/dL — ABNORMAL HIGH (ref 65–99)

## 2015-06-20 MED ORDER — SODIUM CHLORIDE 0.9 % IV BOLUS (SEPSIS)
250.0000 mL | Freq: Once | INTRAVENOUS | Status: AC
Start: 2015-06-20 — End: 2015-06-20
  Administered 2015-06-20: 250 mL via INTRAVENOUS

## 2015-06-20 NOTE — Care Management Note (Signed)
Case Management Note  Patient Details  Name: William Ramos MRN: [id number] Date of Birth: 1927-12-15  Subjective/Objective:                 Patient from ALF, family pays for private 24 hour sitter. NO home therapies recommended at this time per PT.    Action/Plan:  Return to ALF. Will continue to follow.   Expected Discharge Date:                  Expected Discharge Plan:  Home/Self Care [facility])  In-House Referral:  Clinical Social Work  Discharge planning Services  CM Consult  Post Acute Care Choice:    Choice offered to:     DME Arranged:    DME Agency:     HH Arranged:    [place] Agency:     Status of Service:  In process, will continue to follow  Medicare Important Message Given:    Date Medicare IM Given:    Medicare IM give by:    Date Additional Medicare IM Given:    Additional Medicare Important Message give by:     If discussed at [place] of Stay Meetings, dates discussed:    Additional Comments:  Carles Collet, RN 06/20/2015, 11:23 AM

## 2015-06-20 NOTE — Progress Notes (Signed)
Utilization Review completed. Aleksi Brummet RN BSN CM 

## 2015-06-20 NOTE — Progress Notes (Signed)
[facility] PROGRESS NOTE  William Ramos LGX:[id number] DOB: 01-07-1928 DOA: 06/19/2015 PCP: Asencion Noble, MD  Assessment/Plan: UTI Continue empiric Rocephin. Follow urine culture  Acute kidney injury with dehydration and hyperkalemia Improved with IV hydration. Hold ACE inhibitor. Kalemia now resolved. Stable on telemetry.  Severe dementia with frequent falls Resident of memory care unit. PT eval recommends 24-hour supervision. Continue Aricept  Essential hypertension Health blood pressure medications due to soft blood pressure on admission. Next line is on chronic    sinus bradycardia Baseline heart rate in the 40s and 50s and patient asymptomatic. Not on any AV nodal blocking agents. Monitor on telemetry.  Normocytic anemia H&H stable.  Chronic gout/osteoarthritis Continue probenecid and Tylenol.  Protein calorie malnutrition Continue supplements.  CODE STATUS: Full code  Diet: Regular  Code Status: Full code Family Communication: None at bedside Disposition Plan: return to assist living likely tomorrow if remains stable and final urine cultures obtained.   Consultants:  None  Procedures:  None  Antibiotics:  Rocephin  HPI/Subjective: patient seen and examined. Remains afebrile. No overnight issues.  Objective: Filed Vitals:   06/20/15 0648  BP: 93/35  Pulse: 45  Temp:   Resp:     Intake/Output Summary (Last 24 hours) at 06/20/15 1411 Last data filed at 06/20/15 1100  Gross per 24 hour  Intake 2081.67 ml  Output     15 ml  Net 2066.67 ml   Filed Weights   06/19/15 0751  Weight: 77.111 kg (170 lb)    Exam:   General:  Elderly male lying in bed in no acute distress, severely demented  HEENT: Upper forehead laceration, no pallor, moist oral mucosa  Chest: Clear to auscultation bilaterally    CVS: S1 and S2 bradycardic, no murmurs  GI: Soft, nondistended, nontender, bowel sounds present   musculoskeletal: Warm, no  edema  CNS: Alert and awake, oriented x0  Data Reviewed: Basic Metabolic Panel:  Recent Labs Lab 06/19/15 0901 06/19/15 1655 06/20/15 0421  NA 136 141 142  K 5.2* 5.7* 3.9  CL 104 108 108  CO2 27 27 27   GLUCOSE 93 106* 81  BUN 36* 29* 23*  CREATININE 1.29* 1.21 1.02  CALCIUM 8.9 8.8* 8.3*   Liver Function Tests: No results for input(s): AST, ALT, ALKPHOS, BILITOT, PROT, ALBUMIN in the last 168 hours. No results for input(s): LIPASE, AMYLASE in the last 168 hours. No results for input(s): AMMONIA in the last 168 hours. CBC:  Recent Labs Lab 06/19/15 0901 06/20/15 0421  WBC 7.6 7.7  HGB 9.3* 8.7*  HCT 29.0* 27.0*  MCV 97.3 97.8  PLT 252 210   Cardiac Enzymes: No results for input(s): CKTOTAL, CKMB, CKMBINDEX, TROPONINI in the last 168 hours. BNP (last 3 results) No results for input(s): BNP in the last 8760 hours.  ProBNP (last 3 results) No results for input(s): PROBNP in the last 8760 hours.  CBG: No results for input(s): GLUCAP in the last 168 hours.  Recent Results (from the past 240 hour(s))  Urine culture     Status: None (Preliminary result)   Collection Time: 06/19/15 11:10 AM  Result Value Ref Range Status   Specimen Description URINE, CATHETERIZED  Final   Special Requests NONE  Final   Culture CULTURE REINCUBATED FOR BETTER GROWTH  Final   Report Status PENDING  Incomplete  MRSA PCR Screening     Status: None   Collection Time: 06/20/15  6:03 AM  Result Value Ref Range Status   MRSA by  PCR NEGATIVE NEGATIVE Final    Comment:        The GeneXpert MRSA Assay (FDA approved for NASAL specimens only), is one component of a comprehensive MRSA colonization surveillance program. It is not intended to diagnose MRSA infection nor to guide or monitor treatment for MRSA infections.      Studies: Ct Head Wo Contrast  06/19/2015   CLINICAL DATA:  Fall at nursing home, hit head on floor. Hematoma/ laceration to frontal area. History of dementia.   EXAM: CT HEAD WITHOUT CONTRAST  CT CERVICAL SPINE WITHOUT CONTRAST  TECHNIQUE: Multidetector CT imaging of the head and cervical spine was performed following the standard protocol without intravenous contrast. Multiplanar CT image reconstructions of the cervical spine were also generated.  COMPARISON:  Head and cervical spine CT dated 05/13/2015.  FINDINGS: CT HEAD FINDINGS  There is generalized brain atrophy with commensurate dilatation of the ventricles and sulci, unchanged. Again noted are chronic small-vessel ischemic changes within the bilateral periventricular and subcortical white matter regions, unchanged. 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  There is no mass, hemorrhage, edema, or other evidence of acute parenchymal abnormality. No extra-axial hemorrhage. Soft tissue edema/hematoma overlying the lower left frontal bone measures 6 mm thickness. No underlying osseous fracture or displacement. Old nasal bone fractures again incidentally noted.  CT CERVICAL SPINE FINDINGS  Moderate degenerative change again noted throughout the mid and lower cervical spine with associated disc space narrowings and osseous spurring. Alignment of the cervical spine is stable. Dextroscoliosis is unchanged. There is no fracture line or displaced fracture fragment identified. Facet joints remain appropriately aligned.Paravertebral soft tissues are unremarkable. Mild emphysematous change and scarring noted at each lung apex.  IMPRESSION: Soft tissue edema/hematoma overlying the lower left frontal bone. No underlying fracture. No evidence of acute intracranial abnormality. No intracranial hemorrhage or edema.  No acute fracture or dislocation within the cervical spine. Chronic degenerative changes as detailed above.   Electronically Signed   By: Franki Cabot M.D.   On: 06/19/2015 11:02   Ct Cervical Spine Wo Contrast  06/19/2015   CLINICAL DATA:  Fall at nursing home, hit  head on floor. Hematoma/ laceration to frontal area. History of dementia.  EXAM: CT HEAD WITHOUT CONTRAST  CT CERVICAL SPINE WITHOUT CONTRAST  TECHNIQUE: Multidetector CT imaging of the head and cervical spine was performed following the standard protocol without intravenous contrast. Multiplanar CT image reconstructions of the cervical spine were also generated.  COMPARISON:  Head and cervical spine CT dated 05/13/2015.  FINDINGS: CT HEAD FINDINGS  There is generalized brain atrophy with commensurate dilatation of the ventricles and sulci, unchanged. Again noted are chronic small-vessel ischemic changes within the bilateral periventricular and subcortical white matter regions, unchanged. 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  There is no mass, hemorrhage, edema, or other evidence of acute parenchymal abnormality. No extra-axial hemorrhage. Soft tissue edema/hematoma overlying the lower left frontal bone measures 6 mm thickness. No underlying osseous fracture or displacement. Old nasal bone fractures again incidentally noted.  CT CERVICAL SPINE FINDINGS  Moderate degenerative change again noted throughout the mid and lower cervical spine with associated disc space narrowings and osseous spurring. Alignment of the cervical spine is stable. Dextroscoliosis is unchanged. There is no fracture line or displaced fracture fragment identified. Facet joints remain appropriately aligned.Paravertebral soft tissues are unremarkable. Mild emphysematous change and scarring noted at each lung apex.  IMPRESSION: Soft tissue  edema/hematoma overlying the lower left frontal bone. No underlying fracture. No evidence of acute intracranial abnormality. No intracranial hemorrhage or edema.  No acute fracture or dislocation within the cervical spine. Chronic degenerative changes as detailed above.   Electronically Signed   By: Franki Cabot M.D.   On: 06/19/2015 11:02   Dg Hand  Complete Left  06/19/2015   CLINICAL DATA:  Pain following fall  EXAM: LEFT HAND - COMPLETE 3+ VIEW  COMPARISON:  None.  FINDINGS: Frontal, oblique, and lateral views were obtained. There is no demonstrable fracture or dislocation. There is osteoarthritic change in essentially all joints in the hand and wrist. No bony destruction. There is calcification in the triangular fibrocartilage.  IMPRESSION: Extensive osteoarthritic change in essentially all joints. No acute fracture or dislocation. Suspect chronic triangular fibrocartilage region tear with calcification.   Electronically Signed   By: Lowella Grip III M.D.   On: 06/19/2015 10:50    Scheduled Meds: . calcium-vitamin D  1 tablet Oral BID  . cefTRIAXone (ROCEPHIN)  IV  1 g Intravenous Q24H  . cholecalciferol  2,000 Units Oral Daily  . divalproex  125 mg Oral BID   And  . divalproex  250 mg Oral Q2000  . docusate sodium  100 mg Oral BID  . donepezil  10 mg Oral Q breakfast  . enoxaparin (LOVENOX) injection  40 mg Subcutaneous Q24H  . famotidine  20 mg Oral BID  . feeding supplement (GLUCERNA SHAKE)  237 mL Oral BID  . ferrous sulfate  325 mg Oral BID WC  . geriatric multivitamins-minerals  15 mL Oral TID  . LORazepam  0.5 mg Oral BID  . QUEtiapine  37.5 mg Oral QHS   Continuous Infusions: . sodium chloride 100 mL/hr at 06/20/15 1143     Time spent: 25 minutes    Danaly Bari, [place]  [facility] Pager [phone]. If 7PM-7AM, please contact night-coverage at [email or web address], password [facility] 06/20/2015, 2:11 PM  LOS: 1 day

## 2015-06-20 NOTE — Evaluation (Signed)
Physical Therapy Evaluation Patient Details Name: William Ramos MRN: [id number] DOB: [birth year] Today's Date: 06/20/2015   History of Present Illness  Pt adm with fall and UTI. PMH - severe Alzheimers, HTN  Clinical Impression  Pt admitted with above diagnosis and presents to PT with functional limitations due to deficits listed below (See PT problem list). Pt needs skilled PT to maximize independence and safety to allow discharge to back to ALF with 24 hour hired caregiver. Will follow acutely but no PT after DC recommended.     Follow Up Recommendations No PT follow up;Supervision/Assistance - 24 hour    Equipment Recommendations  None recommended by PT    Recommendations for Other Services       Precautions / Restrictions Precautions Precautions: Fall Restrictions Weight Bearing Restrictions: No      Mobility  Bed Mobility Overal bed mobility: Needs Assistance Bed Mobility: Supine to Sit;Sit to Supine     Supine to sit: Min assist Sit to supine: Supervision   General bed mobility comments: Assist needed for direction.  Transfers Overall transfer level: Needs assistance Equipment used: 1 person hand held assist Transfers: Sit to/from Stand Sit to Stand: Min assist;+2 safety/equipment         General transfer comment: Assist for balance and to bring hips up.  Ambulation/Gait Ambulation/Gait assistance: Mod assist;+2 safety/equipment Ambulation Distance (Feet): 130 Feet Assistive device: 1 person hand held assist Gait Pattern/deviations: Step-through pattern;Decreased step length - right;Decreased step length - left;Shuffle;Festinating;Narrow base of support     General Gait Details: Assist for balance and support and for redirection. Pt tends to lean too far forward.  Stairs            Wheelchair Mobility    Modified Rankin (Stroke Patients Only)       Balance Overall balance assessment: Needs assistance Sitting-balance support: No upper  extremity supported;Feet supported Sitting balance-Leahy Scale: Fair     Standing balance support: Single extremity supported Standing balance-Leahy Scale: Poor                               Pertinent Vitals/Pain Pain Assessment: Faces Faces Pain Scale: No hurt    Home Living Family/patient expects to be discharged to:: Assisted living                 Additional Comments: Has 24 hours hired caregivers at [facility] memory unit    Prior Function Level of Independence: Needs assistance   Gait / Transfers Assistance Needed: Assist for transfers and gait with history of falls.  ADL's / Homemaking Assistance Needed: Dependent        Hand Dominance        Extremity/Trunk Assessment   Upper Extremity Assessment: Overall WFL for tasks assessed           Lower Extremity Assessment: Generalized weakness         Communication      Cognition Arousal/Alertness: Awake/alert Behavior During Therapy: Restless Overall Cognitive Status: History of cognitive impairments - at baseline                      General Comments      Exercises        Assessment/Plan    PT Assessment Patient needs continued PT services (during acute stay)  PT Diagnosis Difficulty walking   PT Problem List Decreased strength;Decreased balance;Decreased mobility;Decreased cognition;Decreased knowledge of use of DME  PT  Treatment Interventions Gait training;Functional mobility training;Therapeutic activities;Patient/family education   PT Goals (Current goals can be found in the Care Plan section) Acute Rehab PT Goals Patient Stated Goal: Pt unable PT Goal Formulation: With family Time For Goal Achievement: 06/27/15 Potential to Achieve Goals: Fair    Frequency Min 3X/week   Barriers to discharge        Co-evaluation               End of Session Equipment Utilized During Treatment: Gait belt Activity Tolerance: Patient tolerated treatment well Patient  left: in bed;with call bell/phone within reach;with nursing/sitter in room;with family/visitor present (Pt has 24 hour hired caregiver present)           Time: 9030-0923 PT Time Calculation (min) (ACUTE ONLY): 22 min   Charges:   PT Evaluation $Initial PT Evaluation Tier I: 1 Procedure     PT G Codes:        Lisbet Busker July 20, 2015, 11:39 AM  Suanne Marker PT [phone]

## 2015-06-20 NOTE — Clinical Social Work Note (Signed)
Clinical Social Work Assessment  Patient Details  Name: William Ramos MRN: [id number] Date of Birth: 05-Sep-1928  Date of referral:  06/20/15               Reason for consult:  Facility Placement, Discharge Planning                Permission sought to share information with:  Family Supports, Customer service manager Permission granted to share information::  Yes, Verbal Permission Granted  Name::     Nature conservation officer::  [facility]  Relationship::     Contact Information:     Housing/Transportation Living arrangements for the past 2 months:  [place] of Information:  Spouse Patient Interpreter Needed:  None Criminal Activity/Legal Involvement Pertinent to Current Situation/Hospitalization:  No - Comment as needed Significant Relationships:  Spouse Lives with:  Facility Resident Do you feel safe going back to the place where you live?  Yes Need for family participation in patient care:  Yes (Comment)  Care giving concerns:  Patient's wife does not report any concerns. [place] Staff do state that the patient has been combative at their facility and want to know if anything can be done to help with this. MD made aware.   Social Worker assessment / plan:  CSW spoke with patient's wife to complete assessment. Hoyle Sauer states that she is happy with the care the patient receives at [facility] where he has lived for about a month. She plans for him to return. Facility staff state that they plan to accept the patient back at discharge.  Employment status:  Disabled (Comment on whether or not currently receiving Disability) Insurance information:  Medicare PT Recommendations:  24 Hour Supervision Information / Referral to community resources:  Other (Comment Required) (CSW has communicated with [place])  Patient/Family's Response to care:  Patient's family appears to be happy with the care the patient is  receiving.  Patient/Family's Understanding of and Emotional Response to Diagnosis, Current Treatment, and Prognosis:  Patient's wife is able to articulate reason for admission and patient's diagnosis.   Emotional Assessment Appearance:  Appears stated age Attitude/Demeanor/Rapport:  Unable to Assess (Patient confused.) Affect (typically observed):  Unable to Assess (Patient is confused.) Orientation:  Oriented to Self Alcohol / Substance use:  Alcohol Use Psych involvement (Current and /or in the community):  No (Comment)  Discharge Needs  Concerns to be addressed:  Discharge Planning Concerns Readmission within the last 30 days:  No Current discharge risk:  Chronically ill, Cognitively Impaired Barriers to Discharge:  Continued Medical Work up   [employer] MSW, [place], [place], [phone]

## 2015-06-21 DIAGNOSIS — F0391 Unspecified dementia with behavioral disturbance: Secondary | ICD-10-CM

## 2015-06-21 DIAGNOSIS — E875 Hyperkalemia: Secondary | ICD-10-CM

## 2015-06-21 DIAGNOSIS — N179 Acute kidney failure, unspecified: Secondary | ICD-10-CM

## 2015-06-21 DIAGNOSIS — N390 Urinary tract infection, site not specified: Secondary | ICD-10-CM | POA: Diagnosis not present

## 2015-06-21 MED ORDER — LORAZEPAM 0.5 MG PO TABS: ORAL_TABLET | ORAL | Status: AC

## 2015-06-21 MED ORDER — QUETIAPINE FUMARATE 25 MG PO TABS
25.0000 mg | ORAL_TABLET | Freq: Two times a day (BID) | ORAL | Status: DC
  Administered 2015-06-21: 25 mg via ORAL

## 2015-06-21 MED ORDER — QUETIAPINE FUMARATE 25 MG PO TABS: 37.5000 mg | ORAL_TABLET | Freq: Two times a day (BID) | ORAL | Status: AC

## 2015-06-21 MED ORDER — PNEUMOCOCCAL VAC POLYVALENT 25 MCG/0.5ML IJ INJ
0.5000 mL | INJECTION | Freq: Once | INTRAMUSCULAR | Status: AC
  Administered 2015-06-21: 0.5 mL via INTRAMUSCULAR
  Filled 2015-06-21: qty 0.5

## 2015-06-21 MED ORDER — CEPHALEXIN 500 MG PO CAPS: 500.0000 mg | ORAL_CAPSULE | Freq: Two times a day (BID) | ORAL | Status: AC

## 2015-06-21 NOTE — Care Management Note (Signed)
Case Management Note  Patient Details  Name: William Ramos MRN: [id number] Date of Birth: 02-May-1928  Subjective/Objective:          Patient from ALF, family pays for private 24 hour sitter. No home therapies recommended per PT.            Action/Plan:  Patient to discharge today, will continue with private duty sitter as arranged by family prior to admission. Expected Discharge Date:                  Expected Discharge Plan:  Home/Self Care [facility]F, family supplies private duty sitter/ aide)  In-House Referral:  Clinical Social Work  Discharge planning Services  CM Consult  Post Acute Care Choice:    Choice offered to:     DME Arranged:    DME Agency:     HH Arranged:    [place] Agency:     Status of Service:  Completed, signed off  Medicare Important Message Given:    Date Medicare IM Given:    Medicare IM give by:    Date Additional Medicare IM Given:    Additional Medicare Important Message give by:     If discussed at [place] of Stay Meetings, dates discussed:    Additional Comments:  Carles Collet, RN 06/21/2015, 11:50 AM

## 2015-06-21 NOTE — Progress Notes (Signed)
Occupational Therapy Evaluation Patient Details Name: William Ramos MRN: [id number] DOB: 1928-02-28 Today's Date: 06/21/2015    History of Present Illness Pt adm with fall and UTI. PMH - severe Alzheimers, HTN   Clinical Impression   Patient admitted with above. Patient dependent with ADLs and supervision with mobility PTA. Patient currently requires supervision->min assist with mobility (+2 at times for safety). D/C from acute OT services and no additional follow-up OT needs at this time. All appropriate education provided to caregiver, patient unable. Please re-order OT if needed. Plan is for patient to discharge back to ALF with 24/7.     Follow Up Recommendations  No OT follow up;Supervision/Assistance - 24 hour    Equipment Recommendations  None recommended by OT    Recommendations for Other Services  None at this time, family has hired Tax adviser  Precautions / Restrictions Precautions Precautions: Fall Restrictions Weight Bearing Restrictions: No    Mobility Bed Mobility Overal bed mobility: Needs Assistance Bed Mobility: Supine to Sit;Sit to Supine     Supine to sit: Supervision Sit to supine: Min assist   General bed mobility comments: Supervision for supine>sit secondary to patient eager to get OOB. Min assist during sit>supine secondary to patient not following direction.  Transfers Overall transfer level: Needs assistance Equipment used: 1 person hand held assist Transfers: Sit to/from Stand Sit to Stand: Min assist;+2 safety/equipment General transfer comment: Assist for balance    Balance Overall balance assessment: Needs assistance;History of Falls Sitting-balance support: No upper extremity supported;Feet supported Sitting balance-Leahy Scale: Fair     Standing balance support: No upper extremity supported;During functional activity Standing balance-Leahy Scale: Poor    ADL Overall ADL's : At baseline;Needs assistance/impaired General ADL  Comments: Pt unable to state. According to sitter in room, pt dependent for all ADLs and aide assists him at ALF. Pt required up to +2 for safety. Sitter aware that patient is a fall risk and states that someone is with him 24/7 for all mobility.     Pertinent Vitals/Pain Pain Assessment: No/denies pain Faces Pain Scale: No hurt     Hand Dominance Right   Extremity/Trunk Assessment Upper Extremity Assessment Upper Extremity Assessment: Overall WFL for tasks assessed   Lower Extremity Assessment Lower Extremity Assessment: Defer to PT evaluation   Cervical / Trunk Assessment Cervical / Trunk Assessment: Normal   Communication Communication Communication: No difficulties   Cognition Arousal/Alertness: Awake/alert Behavior During Therapy: Restless Overall Cognitive Status: History of cognitive impairments - at baseline              Home Living Family/patient expects to be discharged to:: Assisted living Additional Comments: Has 24 hours hired caregivers at [facility] memory unit      Prior Functioning/Environment Level of Independence: Needs assistance  Gait / Transfers Assistance Needed: Assist for transfers and gait with history of falls. ADL's / Homemaking Assistance Needed: Dependent        OT Diagnosis: Generalized weakness   OT Problem List:  n/a, no acute OT needs identified    OT Treatment/Interventions:   n/a, no acute OT needs identified    OT Goals(Current goals can be found in the care plan section) Acute Rehab OT Goals Patient Stated Goal: Pt unable OT Goal Formulation: All assessment and education complete, DC therapy  OT Frequency:   n/a, no acute OT needs identified    Barriers to D/C:  None known at this time   End of Session Nurse Communication: Mobility status  Activity Tolerance:  Patient tolerated treatment well Patient left: in bed;with call bell/phone within reach;with nursing/sitter in room   Time: 0943-1000 OT Time Calculation (min): 17  min Charges:  OT General Charges $OT Visit: 1 Procedure OT Evaluation $Initial OT Evaluation Tier I: 1 Procedure  Herve Haug , MS, OTR/L, CLT Pager: [phone]  06/21/2015, 10:08 AM

## 2015-06-21 NOTE — Clinical Documentation Improvement (Signed)
  Please clarify the degree of malnutrition.  Mild Malnutrition Moderate Malnutrition Severe Malnutrition Unable to suspect or determine  06/20/15 Progress Note:  "Protein Calorie Malnutrition-continue supplements." BMI= 26.62  Thank you!  Pryor Montes, BSN, RN [facility]/Clinical Documentation Specialist [email or web address] [phone]

## 2015-06-21 NOTE — Progress Notes (Signed)
Pt prepared for d/c to SNF. IV d/c'd. Skin intact except as most recently charted. Vitals are stable. Report called to receiving facility. Pt to be transported by ambulance service. 

## 2015-06-21 NOTE — Discharge Summary (Signed)
Physician Discharge Summary  William Ramos TJQ:[id number] DOB: January 05, 1928 DOA: 06/19/2015  PCP: Asencion Noble, MD  Admit date: 06/19/2015 Discharge date: 06/21/2015  Time spent: 30 minutes  Recommendations for Outpatient Follow-up:  1. Discharge to assist living/memory care unit with outpatient PCP follow-up 2. Patient will complete antibiotic course on 7/26  Discharge Diagnoses:  Principal Problem:   Urinary tract infection in elderly patient   Active Problems:   Frequent falls   Dementia with behavioral disturbance   Acute hyperkalemia   Dehydration, mild   Acute kidney injury   Normocytic anemia   Osteoarthritis   History of diverticulosis   HTN (hypertension)   Chronic sinus bradycardia   Gout   HLD (hyperlipidemia)   Discharge Condition: Fair  Diet recommendation: Regular  Filed Weights   06/19/15 0751  Weight: 77.111 kg (170 lb)    History of present illness:  Please refer to admission H&P for details, in brief, 81 year old male resident of a memory care unit presents to the hospital after experiencing a fall. Patient has been in the emergency department multiple occasions over the past 12 months for falls. Patient has a history of advanced dementia, hypertension, dyslipidemia, gout as well. The patient had an apparent unwitnessed fall from a sitting position around 7 AM on the day of admission. Staff assisted patient upright and he was able to walk about difficulty. At baseline patient is confused with episodic bouts of agitation and combativeness.  In the ER patient was afebrile, sinus bradycardia with pulse rate primarily in the mid 40s occasionally up into the 60s which is baseline for him, systolic blood pressure low for him at 91/45. He was maintaining room air sats 100%. he was given IV fluids in the ED. CT of head and cervical spine were unrevealing for any bony injury but did reveal soft tissue edema and hematoma overlying the left frontal bone consistent with  observed vertical abrasions and contusion. A left hand x-ray was also completed which revealed extensive osteo arthritic changes in all the joints without any acute fracture or dislocation; suspected chronic triangular fibrocartilage region tear with calcification. Labs were consistent with volume depletion with a potassium of 5.2, BUN 36 and creatinine 1.29. Urinalysis was suggestive of UTI. Patient admitted to hospitalist service.   Hospital Course:  UTI List on empiric Rocephin. Cultures without any growth. Remains afebrile. Will treat him for 5 more days of oral Keflex.  Acute kidney injury with dehydration and hyperkalemia Improved with IV hydration. Held ACE inhibitor. Able on telemetry and hyperkalemia resolved. Will resume ACE inhibitor upon discharge.  Severe dementia with frequent falls and agitation. Resident of memory care unit. PT eval recommends 24-hour supervision. Continue Aricept. Inpatient on Seroquel and I have increased the dose to 25 mg twice daily.  Essential hypertension Pressure medications were held for soft blood pressure. Low-dose lisinopril and when necessary Lasix at home which have been resumed.   sinus bradycardia Baseline heart rate in the 40s and 50s and patient asymptomatic. Not on any AV nodal blocking agents. As preoperatively been stable on telemetry.  Normocytic anemia H&H stable.  Chronic gout/osteoarthritis Continue probenecid and Tylenol.  Protein calorie malnutrition Continue supplements.  GERD Continue PPI   CODE STATUS: Full code  Diet: Regular  Code Status: Full code Family Communication: Family at bedside Disposition Plan: return to assist living    Consultants:  None  Procedures:  None  Antibiotics:  Rocephin    Discharge Exam: Filed Vitals:   06/21/15 0605  BP:  128/56  Pulse: 59  Temp: 98.4 F (36.9 C)  Resp:      General: Elderly male lying in bed in no acute distress, severely demented  HEENT:  Upper forehead laceration, no pallor, moist oral mucosa  Chest: Clear to auscultation bilaterally   CVS: S1 and S2 bradycardic, no murmurs  GI: Soft, nondistended, nontender, bowel sounds present   musculoskeletal: Warm, no edema  CNS: Alert and awake, oriented x0  Discharge Instructions    Current Discharge Medication List    START taking these medications   Details  cephALEXin (KEFLEX) 500 MG capsule Take 1 capsule (500 mg total) by mouth 2 (two) times daily. Qty: 10 capsule, Refills: 0      CONTINUE these medications which have CHANGED   Details  QUEtiapine (SEROQUEL) 25 MG tablet Take 1.5 tablets (37.5 mg total) by mouth 2 (two) times daily. Qty: 30 tablet, Refills: 0      CONTINUE these medications which have NOT CHANGED   Details  calcium-vitamin D (OSCAL WITH D) 500-200 MG-UNIT per tablet Take 1 tablet by mouth 2 (two) times daily.    Cholecalciferol (VITAMIN D) 2000 UNITS CAPS Take 2,000 Units by mouth daily.    divalproex (DEPAKOTE SPRINKLE) 125 MG capsule Take 125 mg by mouth See admin instructions. 125mg  at 8am and at 12pm and 250mg  at 8pm    donepezil (ARICEPT) 10 MG tablet Take 10 mg by mouth daily with breakfast.    famotidine (PEPCID) 20 MG tablet Take 20 mg by mouth 2 (two) times daily.    feeding supplement, GLUCERNA SHAKE, (GLUCERNA SHAKE) LIQD Take 237 mLs by mouth 2 (two) times daily.    ferrous sulfate 325 (65 FE) MG tablet Take 325 mg by mouth 2 (two) times daily with a meal.    geriatric multivitamins-minerals (ELDERTONIC/GEVRABON) ELIX Take 15 mLs by mouth 3 (three) times daily.    lisinopril (PRINIVIL,ZESTRIL) 5 MG tablet Take 5 mg by mouth daily.    loperamide (IMODIUM) 2 MG capsule Take 2 mg by mouth every 4 (four) hours as needed for diarrhea or loose stools (diarrhea).          !! LORazepam (ATIVAN) 0.5 MG tablet Take 0.5 mg by mouth every 6 (six) hours as needed for anxiety.     Melatonin 3 MG CAPS Take 6 mg by mouth at bedtime.     Multiple Vitamins-Minerals (I-VITE) TABS Take 1 tablet by mouth daily with breakfast.    probenecid (BENEMID) 500 MG tablet Take 500 mg by mouth daily with breakfast.     traZODone (DESYREL) 50 MG tablet Take 50 mg by mouth at bedtime as needed for sleep.    acetaminophen (TYLENOL) 325 MG tablet Take 650 mg by mouth every 6 (six) hours as needed for moderate pain (pain).     chlorproMAZINE (THORAZINE) 25 MG tablet Take 25 mg by mouth every 6 (six) hours as needed (for hiccups).    furosemide (LASIX) 20 MG tablet Take 10 mg by mouth daily as needed for fluid.     omeprazole (PRILOSEC) 20 MG capsule Take 20 mg by mouth daily with breakfast.          No Known Allergies Follow-up Information    Follow up with Asencion Noble, MD. Schedule an appointment as soon as possible for a visit in 1 week.   Specialty:  Internal Medicine   Contact information:   [address] [place] [address] [phone]        The results  of significant diagnostics from this hospitalization (including imaging, microbiology, ancillary and laboratory) are listed below for reference.    Significant Diagnostic Studies: Ct Head Wo Contrast  06/19/2015   CLINICAL DATA:  Fall at nursing home, hit head on floor. Hematoma/ laceration to frontal area. History of dementia.  EXAM: CT HEAD WITHOUT CONTRAST  CT CERVICAL SPINE WITHOUT CONTRAST  TECHNIQUE: Multidetector CT imaging of the head and cervical spine was performed following the standard protocol without intravenous contrast. Multiplanar CT image reconstructions of the cervical spine were also generated.  COMPARISON:  Head and cervical spine CT dated 05/13/2015.  FINDINGS: CT HEAD FINDINGS  There is generalized brain atrophy with commensurate dilatation of the ventricles and sulci, unchanged. Again noted are chronic small-vessel ischemic changes within the bilateral periventricular and subcortical white matter regions, unchanged. 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  There is no mass, hemorrhage, edema, or other evidence of acute parenchymal abnormality. No extra-axial hemorrhage. Soft tissue edema/hematoma overlying the lower left frontal bone measures 6 mm thickness. No underlying osseous fracture or displacement. Old nasal bone fractures again incidentally noted.  CT CERVICAL SPINE FINDINGS  Moderate degenerative change again noted throughout the mid and lower cervical spine with associated disc space narrowings and osseous spurring. Alignment of the cervical spine is stable. Dextroscoliosis is unchanged. There is no fracture line or displaced fracture fragment identified. Facet joints remain appropriately aligned.Paravertebral soft tissues are unremarkable. Mild emphysematous change and scarring noted at each lung apex.  IMPRESSION: Soft tissue edema/hematoma overlying the lower left frontal bone. No underlying fracture. No evidence of acute intracranial abnormality. No intracranial hemorrhage or edema.  No acute fracture or dislocation within the cervical spine. Chronic degenerative changes as detailed above.   Electronically Signed   By: Franki Cabot M.D.   On: 06/19/2015 11:02   Ct Cervical Spine Wo Contrast  06/19/2015   CLINICAL DATA:  Fall at nursing home, hit head on floor. Hematoma/ laceration to frontal area. History of dementia.  EXAM: CT HEAD WITHOUT CONTRAST  CT CERVICAL SPINE WITHOUT CONTRAST  TECHNIQUE: Multidetector CT imaging of the head and cervical spine was performed following the standard protocol without intravenous contrast. Multiplanar CT image reconstructions of the cervical spine were also generated.  COMPARISON:  Head and cervical spine CT dated 05/13/2015.  FINDINGS: CT HEAD FINDINGS  There is generalized brain atrophy with commensurate dilatation of the ventricles and sulci, unchanged. Again noted are chronic small-vessel ischemic changes within the bilateral  periventricular and subcortical white matter regions, unchanged. 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  There is no mass, hemorrhage, edema, or other evidence of acute parenchymal abnormality. No extra-axial hemorrhage. Soft tissue edema/hematoma overlying the lower left frontal bone measures 6 mm thickness. No underlying osseous fracture or displacement. Old nasal bone fractures again incidentally noted.  CT CERVICAL SPINE FINDINGS  Moderate degenerative change again noted throughout the mid and lower cervical spine with associated disc space narrowings and osseous spurring. Alignment of the cervical spine is stable. Dextroscoliosis is unchanged. There is no fracture line or displaced fracture fragment identified. Facet joints remain appropriately aligned.Paravertebral soft tissues are unremarkable. Mild emphysematous change and scarring noted at each lung apex.  IMPRESSION: Soft tissue edema/hematoma overlying the lower left frontal bone. No underlying fracture. No evidence of acute intracranial abnormality. No intracranial hemorrhage or edema.  No acute fracture or dislocation within the cervical spine. Chronic degenerative changes as  detailed above.   Electronically Signed   By: Franki Cabot M.D.   On: 06/19/2015 11:02   Dg Hand Complete Left  06/19/2015   CLINICAL DATA:  Pain following fall  EXAM: LEFT HAND - COMPLETE 3+ VIEW  COMPARISON:  None.  FINDINGS: Frontal, oblique, and lateral views were obtained. There is no demonstrable fracture or dislocation. There is osteoarthritic change in essentially all joints in the hand and wrist. No bony destruction. There is calcification in the triangular fibrocartilage.  IMPRESSION: Extensive osteoarthritic change in essentially all joints. No acute fracture or dislocation. Suspect chronic triangular fibrocartilage region tear with calcification.   Electronically Signed   By: Lowella Grip III M.D.   On:  06/19/2015 10:50    Microbiology: Recent Results (from the past 240 hour(s))  Urine culture     Status: None (Preliminary result)   Collection Time: 06/19/15 11:10 AM  Result Value Ref Range Status   Specimen Description URINE, CATHETERIZED  Final   Special Requests NONE  Final   Culture CULTURE REINCUBATED FOR BETTER GROWTH  Final   Report Status PENDING  Incomplete  MRSA PCR Screening     Status: None   Collection Time: 06/20/15  6:03 AM  Result Value Ref Range Status   MRSA by PCR NEGATIVE NEGATIVE Final    Comment:        The GeneXpert MRSA Assay (FDA approved for NASAL specimens only), is one component of a comprehensive MRSA colonization surveillance program. It is not intended to diagnose MRSA infection nor to guide or monitor treatment for MRSA infections.      Labs: Basic Metabolic Panel:  Recent Labs Lab 06/19/15 0901 06/19/15 1655 06/20/15 0421  NA 136 141 142  K 5.2* 5.7* 3.9  CL 104 108 108  CO2 27 27 27   GLUCOSE 93 106* 81  BUN 36* 29* 23*  CREATININE 1.29* 1.21 1.02  CALCIUM 8.9 8.8* 8.3*   Liver Function Tests: No results for input(s): AST, ALT, ALKPHOS, BILITOT, PROT, ALBUMIN in the last 168 hours. No results for input(s): LIPASE, AMYLASE in the last 168 hours. No results for input(s): AMMONIA in the last 168 hours. CBC:  Recent Labs Lab 06/19/15 0901 06/20/15 0421  WBC 7.6 7.7  HGB 9.3* 8.7*  HCT 29.0* 27.0*  MCV 97.3 97.8  PLT 252 210   Cardiac Enzymes: No results for input(s): CKTOTAL, CKMB, CKMBINDEX, TROPONINI in the last 168 hours. BNP: BNP (last 3 results) No results for input(s): BNP in the last 8760 hours.  ProBNP (last 3 results) No results for input(s): PROBNP in the last 8760 hours.  CBG:  Recent Labs Lab 06/20/15 1548  GLUCAP 123*       Signed:  Naitik Hermann  [facility] 06/21/2015, 11:18 AM

## 2015-06-21 NOTE — Clinical Social Work Note (Signed)
Per MD patient ready to DC back to [facility]. RN, patient/family Hoyle Sauer), and facility notified of patient's DC. RN given number for report. DC packet on patient's chart. Ambulance transport requested for patient for 2:00PM. CSW signing off at this time.   Liz Beach MSW, [place], [place], [phone]

## 2015-06-22 LAB — URINE CULTURE

## 2015-10-29 ENCOUNTER — Inpatient Hospital Stay (HOSPITAL_COMMUNITY)
Admission: EM | Admit: 2015-10-29 | Discharge: 2015-12-02 | DRG: 871 | Disposition: E | Payer: Medicare Other | Attending: Internal Medicine | Admitting: Internal Medicine

## 2015-10-29 ENCOUNTER — Encounter (HOSPITAL_COMMUNITY): Payer: Self-pay | Admitting: Emergency Medicine

## 2015-10-29 ENCOUNTER — Emergency Department (HOSPITAL_COMMUNITY): Payer: Medicare Other

## 2015-10-29 DIAGNOSIS — J690 Pneumonitis due to inhalation of food and vomit: Secondary | ICD-10-CM | POA: Diagnosis present

## 2015-10-29 DIAGNOSIS — Z79899 Other long term (current) drug therapy: Secondary | ICD-10-CM

## 2015-10-29 DIAGNOSIS — J9602 Acute respiratory failure with hypercapnia: Secondary | ICD-10-CM | POA: Diagnosis present

## 2015-10-29 DIAGNOSIS — G931 Anoxic brain damage, not elsewhere classified: Secondary | ICD-10-CM | POA: Diagnosis present

## 2015-10-29 DIAGNOSIS — N179 Acute kidney failure, unspecified: Secondary | ICD-10-CM | POA: Diagnosis not present

## 2015-10-29 DIAGNOSIS — A419 Sepsis, unspecified organism: Secondary | ICD-10-CM | POA: Diagnosis present

## 2015-10-29 DIAGNOSIS — Z515 Encounter for palliative care: Secondary | ICD-10-CM | POA: Diagnosis not present

## 2015-10-29 DIAGNOSIS — K219 Gastro-esophageal reflux disease without esophagitis: Secondary | ICD-10-CM | POA: Diagnosis present

## 2015-10-29 DIAGNOSIS — G309 Alzheimer's disease, unspecified: Secondary | ICD-10-CM | POA: Diagnosis present

## 2015-10-29 DIAGNOSIS — E860 Dehydration: Secondary | ICD-10-CM | POA: Diagnosis present

## 2015-10-29 DIAGNOSIS — R739 Hyperglycemia, unspecified: Secondary | ICD-10-CM | POA: Diagnosis present

## 2015-10-29 DIAGNOSIS — R0600 Dyspnea, unspecified: Secondary | ICD-10-CM | POA: Diagnosis present

## 2015-10-29 DIAGNOSIS — R578 Other shock: Secondary | ICD-10-CM | POA: Diagnosis present

## 2015-10-29 DIAGNOSIS — Y95 Nosocomial condition: Secondary | ICD-10-CM | POA: Diagnosis present

## 2015-10-29 DIAGNOSIS — J189 Pneumonia, unspecified organism: Secondary | ICD-10-CM | POA: Diagnosis present

## 2015-10-29 DIAGNOSIS — T17908A Unspecified foreign body in respiratory tract, part unspecified causing other injury, initial encounter: Secondary | ICD-10-CM | POA: Insufficient documentation

## 2015-10-29 DIAGNOSIS — J9600 Acute respiratory failure, unspecified whether with hypoxia or hypercapnia: Secondary | ICD-10-CM | POA: Diagnosis not present

## 2015-10-29 DIAGNOSIS — Z66 Do not resuscitate: Secondary | ICD-10-CM

## 2015-10-29 DIAGNOSIS — E46 Unspecified protein-calorie malnutrition: Secondary | ICD-10-CM | POA: Diagnosis present

## 2015-10-29 DIAGNOSIS — J9601 Acute respiratory failure with hypoxia: Secondary | ICD-10-CM | POA: Diagnosis present

## 2015-10-29 DIAGNOSIS — G3183 Dementia with Lewy bodies: Secondary | ICD-10-CM | POA: Diagnosis present

## 2015-10-29 DIAGNOSIS — R6521 Severe sepsis with septic shock: Secondary | ICD-10-CM | POA: Diagnosis present

## 2015-10-29 DIAGNOSIS — K922 Gastrointestinal hemorrhage, unspecified: Secondary | ICD-10-CM | POA: Diagnosis present

## 2015-10-29 DIAGNOSIS — D696 Thrombocytopenia, unspecified: Secondary | ICD-10-CM | POA: Diagnosis not present

## 2015-10-29 DIAGNOSIS — G9341 Metabolic encephalopathy: Secondary | ICD-10-CM | POA: Diagnosis present

## 2015-10-29 DIAGNOSIS — F0390 Unspecified dementia without behavioral disturbance: Secondary | ICD-10-CM | POA: Diagnosis not present

## 2015-10-29 DIAGNOSIS — Z859 Personal history of malignant neoplasm, unspecified: Secondary | ICD-10-CM

## 2015-10-29 DIAGNOSIS — I10 Essential (primary) hypertension: Secondary | ICD-10-CM | POA: Diagnosis present

## 2015-10-29 DIAGNOSIS — R402432 Glasgow coma scale score 3-8, at arrival to emergency department: Secondary | ICD-10-CM | POA: Diagnosis present

## 2015-10-29 DIAGNOSIS — D649 Anemia, unspecified: Secondary | ICD-10-CM | POA: Diagnosis present

## 2015-10-29 DIAGNOSIS — Z6825 Body mass index (BMI) 25.0-25.9, adult: Secondary | ICD-10-CM

## 2015-10-29 DIAGNOSIS — I959 Hypotension, unspecified: Secondary | ICD-10-CM | POA: Insufficient documentation

## 2015-10-29 LAB — COMPREHENSIVE METABOLIC PANEL
ALBUMIN: 3 g/dL — AB (ref 3.5–5.0)
ALT: 23 U/L (ref 17–63)
AST: 30 U/L (ref 15–41)
Alkaline Phosphatase: 73 U/L (ref 38–126)
Anion gap: 17 — ABNORMAL HIGH (ref 5–15)
BUN: 42 mg/dL — AB (ref 6–20)
CHLORIDE: 92 mmol/L — AB (ref 101–111)
CO2: 22 mmol/L (ref 22–32)
Calcium: 9.5 mg/dL (ref 8.9–10.3)
Creatinine, Ser: 1.68 mg/dL — ABNORMAL HIGH (ref 0.61–1.24)
GFR calc Af Amer: 41 mL/min — ABNORMAL LOW (ref >60)
GFR calc non Af Amer: 35 mL/min — ABNORMAL LOW (ref >60)
GLUCOSE: 223 mg/dL — AB (ref 65–99)
POTASSIUM: 4 mmol/L (ref 3.5–5.1)
Sodium: 131 mmol/L — ABNORMAL LOW (ref 135–145)
Total Bilirubin: 0.4 mg/dL (ref 0.3–1.2)
Total Protein: 6.4 g/dL — ABNORMAL LOW (ref 6.5–8.1)

## 2015-10-29 LAB — TYPE AND SCREEN
ABO/RH(D): A NEG
ANTIBODY SCREEN: NEGATIVE

## 2015-10-29 LAB — I-STAT ARTERIAL BLOOD GAS, ED
ACID-BASE DEFICIT: 1 mmol/L (ref 0.0–2.0)
BICARBONATE: 27.5 meq/L — AB (ref 20.0–24.0)
BICARBONATE: 24.4 meq/L — AB (ref 20.0–24.0)
O2 SAT: 100 %
O2 Saturation: 91 %
PCO2 ART: 59 mmHg — AB (ref 35.0–45.0)
PCO2 ART: 43.5 mmHg (ref 35.0–45.0)
PO2 ART: 272 mmHg — AB (ref 80.0–100.0)
PO2 ART: 61 mmHg — AB (ref 80.0–100.0)
Patient temperature: 98.6
Patient temperature: 97.8
TCO2: 29 mmol/L (ref 0–100)
TCO2: 26 mmol/L (ref 0–100)
pH, Arterial: 7.277 — ABNORMAL LOW (ref 7.350–7.450)
pH, Arterial: 7.355 (ref 7.350–7.450)

## 2015-10-29 LAB — I-STAT CHEM 8, ED
BUN: 45 mg/dL — AB (ref 6–20)
CALCIUM ION: 1.23 mmol/L (ref 1.13–1.30)
CHLORIDE: 97 mmol/L — AB (ref 101–111)
CREATININE: 1.4 mg/dL — AB (ref 0.61–1.24)
Glucose, Bld: 221 mg/dL — ABNORMAL HIGH (ref 65–99)
HEMATOCRIT: 41 % (ref 39.0–52.0)
Hemoglobin: 13.9 g/dL (ref 13.0–17.0)
Potassium: 4.1 mmol/L (ref 3.5–5.1)
SODIUM: 139 mmol/L (ref 135–145)
TCO2: 25 mmol/L (ref 0–100)

## 2015-10-29 LAB — CBC
HEMATOCRIT: 38.9 % — AB (ref 39.0–52.0)
HEMOGLOBIN: 12.2 g/dL — AB (ref 13.0–17.0)
MCH: 32.2 pg (ref 26.0–34.0)
MCHC: 31.4 g/dL (ref 30.0–36.0)
MCV: 102.6 fL — ABNORMAL HIGH (ref 78.0–100.0)
Platelets: 255 10*3/uL (ref 150–400)
RBC: 3.79 MIL/uL — AB (ref 4.22–5.81)
RDW: 13.8 % (ref 11.5–15.5)
WBC: 13.1 10*3/uL — ABNORMAL HIGH (ref 4.0–10.5)

## 2015-10-29 LAB — URINALYSIS, ROUTINE W REFLEX MICROSCOPIC
BILIRUBIN URINE: NEGATIVE
Glucose, UA: NEGATIVE mg/dL
Ketones, ur: NEGATIVE mg/dL
LEUKOCYTES UA: NEGATIVE
NITRITE: NEGATIVE
Protein, ur: NEGATIVE mg/dL
SPECIFIC GRAVITY, URINE: 1.02 (ref 1.005–1.030)
pH: 6 (ref 5.0–8.0)

## 2015-10-29 LAB — OCCULT BLOOD GASTRIC / DUODENUM (SPECIMEN CUP): Occult Blood, Gastric: POSITIVE — AB

## 2015-10-29 LAB — URINE MICROSCOPIC-ADD ON: BACTERIA UA: NONE SEEN

## 2015-10-29 LAB — VALPROIC ACID LEVEL: Valproic Acid Lvl: 56 ug/mL (ref 50.0–100.0)

## 2015-10-29 LAB — ABO/RH: ABO/RH(D): A NEG

## 2015-10-29 MED ORDER — HEPARIN SODIUM (PORCINE) 5000 UNIT/ML IJ SOLN
5000.0000 [IU] | Freq: Three times a day (TID) | INTRAMUSCULAR | Status: DC
  Administered 2015-10-29: 5000 [IU] via SUBCUTANEOUS
  Filled 2015-10-29: qty 1

## 2015-10-29 MED ORDER — FENTANYL CITRATE (PF) 100 MCG/2ML IJ SOLN
50.0000 ug | Freq: Once | INTRAMUSCULAR | Status: AC
  Administered 2015-10-29: 50 ug via INTRAVENOUS

## 2015-10-29 MED ORDER — MIDAZOLAM HCL 2 MG/2ML IJ SOLN: 1.0000 mg | INTRAMUSCULAR | Status: DC | PRN

## 2015-10-29 MED ORDER — SODIUM CHLORIDE 0.9 % IV BOLUS (SEPSIS)
1000.0000 mL | Freq: Once | INTRAVENOUS | Status: AC
  Administered 2015-10-29: 1000 mL via INTRAVENOUS

## 2015-10-29 MED ORDER — FAMOTIDINE IN NACL 20-0.9 MG/50ML-% IV SOLN
20.0000 mg | Freq: Two times a day (BID) | INTRAVENOUS | Status: DC
  Administered 2015-10-29 – 2015-10-30 (×2): 20 mg via INTRAVENOUS
  Filled 2015-10-29 (×3): qty 50

## 2015-10-29 MED ORDER — MIDAZOLAM HCL 5 MG/5ML IJ SOLN
1.0000 mg | INTRAMUSCULAR | Status: DC | PRN
  Administered 2015-10-29 (×3): 1 mg via INTRAVENOUS

## 2015-10-29 MED ORDER — MIDAZOLAM HCL 2 MG/2ML IJ SOLN
1.0000 mg | INTRAMUSCULAR | Status: DC | PRN
  Administered 2015-10-29: 1 mg via INTRAVENOUS

## 2015-10-29 MED ORDER — PIPERACILLIN-TAZOBACTAM 3.375 G IVPB
3.3750 g | Freq: Three times a day (TID) | INTRAVENOUS | Status: DC
  Administered 2015-10-30 (×2): 3.375 g via INTRAVENOUS
  Filled 2015-10-29 (×4): qty 50

## 2015-10-29 MED ORDER — VANCOMYCIN HCL 10 G IV SOLR
1500.0000 mg | Freq: Once | INTRAVENOUS | Status: AC
  Administered 2015-10-29: 1500 mg via INTRAVENOUS
  Filled 2015-10-29: qty 1500

## 2015-10-29 MED ORDER — ROCURONIUM BROMIDE 50 MG/5ML IV SOLN
INTRAVENOUS | Status: AC | PRN
  Administered 2015-10-29: 100 mg via INTRAVENOUS

## 2015-10-29 MED ORDER — MIDAZOLAM HCL 2 MG/2ML IJ SOLN
INTRAMUSCULAR | Status: AC
  Administered 2015-10-29: 1 mg via INTRAVENOUS
  Filled 2015-10-29: qty 2

## 2015-10-29 MED ORDER — PIPERACILLIN-TAZOBACTAM IN DEX 2-0.25 GM/50ML IV SOLN
3.3750 g | Freq: Once | INTRAVENOUS | Status: AC
  Administered 2015-10-29: 3.375 g via INTRAVENOUS
  Filled 2015-10-29: qty 100

## 2015-10-29 MED ORDER — INSULIN ASPART 100 UNIT/ML ~~LOC~~ SOLN: 2.0000 [IU] | SUBCUTANEOUS | Status: DC

## 2015-10-29 MED ORDER — SODIUM CHLORIDE 0.9 % IV SOLN
25.0000 ug/h | INTRAVENOUS | Status: DC
  Administered 2015-10-29: 50 ug/h via INTRAVENOUS
  Filled 2015-10-29: qty 50

## 2015-10-29 MED ORDER — SODIUM CHLORIDE 0.9 % IV BOLUS (SEPSIS)
2000.0000 mL | Freq: Once | INTRAVENOUS | Status: AC
  Administered 2015-10-29: 2000 mL via INTRAVENOUS

## 2015-10-29 MED ORDER — SODIUM CHLORIDE 0.9 % IV SOLN
INTRAVENOUS | Status: DC
  Administered 2015-10-29 – 2015-11-01 (×3): via INTRAVENOUS

## 2015-10-29 MED ORDER — SODIUM CHLORIDE 0.9 % IV BOLUS (SEPSIS)
1000.0000 mL | Freq: Once | INTRAVENOUS | Status: AC
Start: 2015-10-29 — End: 2015-10-29
  Administered 2015-10-29: 1000 mL via INTRAVENOUS

## 2015-10-29 MED ORDER — FENTANYL CITRATE (PF) 100 MCG/2ML IJ SOLN
100.0000 ug | INTRAMUSCULAR | Status: DC | PRN
  Administered 2015-10-29 (×4): 100 ug via INTRAVENOUS
  Filled 2015-10-29 (×4): qty 2

## 2015-10-29 MED ORDER — ETOMIDATE 2 MG/ML IV SOLN
INTRAVENOUS | Status: AC | PRN
  Administered 2015-10-29: 20 mg via INTRAVENOUS

## 2015-10-29 MED ORDER — FENTANYL BOLUS VIA INFUSION
25.0000 ug | INTRAVENOUS | Status: DC | PRN
  Filled 2015-10-29: qty 25

## 2015-10-29 MED ORDER — NOREPINEPHRINE BITARTRATE 1 MG/ML IV SOLN
2.0000 ug/min | INTRAVENOUS | Status: DC
  Administered 2015-10-30: 15 ug/min via INTRAVENOUS
  Administered 2015-10-30: 2 ug/min via INTRAVENOUS
  Filled 2015-10-29 (×4): qty 4

## 2015-10-29 MED ORDER — VANCOMYCIN HCL IN DEXTROSE 750-5 MG/150ML-% IV SOLN
750.0000 mg | INTRAVENOUS | Status: DC
  Filled 2015-10-29: qty 150

## 2015-10-29 MED ORDER — SODIUM CHLORIDE 0.9 % IV SOLN: 250.0000 mL | INTRAVENOUS | Status: DC | PRN

## 2015-10-29 NOTE — Progress Notes (Signed)
ANTIBIOTIC CONSULT NOTE - INITIAL  Pharmacy Consult for vancomycin and zosyn Indication: rule out pneumonia  No Known Allergies  Patient Measurements: Height: 5\' 8"  (172.7 cm) Weight: 165 lb 5.5 oz (75 kg) IBW/kg (Calculated) : 68.4   Vital Signs: Temp: 97.8 F (36.6 C) (11/28 1835) Temp Source: Rectal (11/28 1835) BP: 147/86 mmHg (11/28 1846) Pulse Rate: 139 (11/28 1846)  Labs:  Recent Labs  10/21/2015 1717 10/21/2015 1744  WBC 13.1*  --   HGB 12.2* 13.9  PLT 255  --   CREATININE 1.68* 1.40*     Microbiology: Cx data: 11/28: px 06/19/15: urine: P.aeruginosa R to Gent   Anti-infective's   11/28 Zosyn x1 dose 11/28 Vancomycin >>   Medical History: Past Medical History  Diagnosis Date  . Hypertension   . Gout   . Hypercholesteremia   . Arthritis   . Acid reflux   . Cancer ([place])     Cancer-prostate  . Edema   . Psoriasis   . Alzheimer disease     Assessment: 87yoM admitted from nursing home with respiratory distress requiring intubation. WBC 13.1, SCr 1.4 and x-ray concerning for infection vs aspiration  Goal of Therapy:  Vancomycin trough level 15-20 mcg/ml  Plan:  1. Vancomycin 1500 mg x 1; followed by 750 mg Q24H 2. Zosyn 3.375 gm x 1 30 min followed by 3.375 gms Q8H 3. F/u px cx data and deescalate as feasible  Vincenza Hews, PharmD, BCPS 10/08/2015, 7:35 PM Pager: [phone]

## 2015-10-29 NOTE — ED Notes (Signed)
Pt here from nursing home with c/o resp distress , pt has some n/v today , pt sats at nursing home in the 70's pt arrived on nrb and nasal trumpet

## 2015-10-29 NOTE — ED Notes (Signed)
After intubation lung sounds are clear, og output sent to lab for occult blood test ,

## 2015-10-29 NOTE — H&P (Signed)
PULMONARY / CRITICAL CARE MEDICINE   Name: William Ramos MRN: [id number] DOB: 1928/09/28    ADMISSION DATE:  10/04/2015 CONSULTATION DATE:  11/28  REFERRING MD :  EDP  CHIEF COMPLAINT: Respiratory distress  HISTORY OF PRESENT ILLNESS:  81 year old male, retired MD from [employer], past medical history as below, which includes advanced Lewy body dementia, hypertension, and GERD. He resides in assisted living facility due to advanced dementia which has worsened significantly over the last year. According to staff at the facility he is unable to do most activities. He is unable to recognize family and staff. He rarely speaks. He has been "not himself" for about the last week and increasingly lethargic over the past few days. When the CNA went into his room to check on him today she noticed him to be in profound respiratory distress and noted copious amounts of gastric emesis. Emesis was described as feculent and odor and color. She said he was breathing very fast and was gurgling. Vital signs were checked and his O2 sats were noted to be in the 70s. EMS was called and he was transported to the emergency department where he was intubated. Chest x-ray was consistent with right lower lobe pneumonia. PCCM was asked to admit. Upon conversation with his wife has been determined that he has a DO NOT RESUSCITATE order established. Also of note, he has when necessary Thorazine ordered and has not received this all month, with the exception of yesterday 11/28, when he received 2 doses.  PAST MEDICAL HISTORY :  He  has a past medical history of Hypertension; Gout; Hypercholesteremia; Arthritis; Acid reflux; Cancer ([place]); Edema; Psoriasis; and Alzheimer disease.  PAST SURGICAL HISTORY: He  has past surgical history that includes Cataract extraction w/PHACO (07/01/2012); Cataract extraction w/PHACO (09/02/2012); Tonsillectomy; and Esophageal dilation.  No Known Allergies  No current facility-administered  medications on file prior to encounter.   Current Outpatient Prescriptions on File Prior to Encounter  Medication Sig  . acetaminophen (TYLENOL) 325 MG tablet Take 650 mg by mouth every 6 (six) hours as needed for moderate pain (pain).   . calcium-vitamin D (OSCAL WITH D) 500-200 MG-UNIT per tablet Take 1 tablet by mouth 2 (two) times daily.  . chlorproMAZINE (THORAZINE) 25 MG tablet Take 25 mg by mouth every 6 (six) hours as needed (for hiccups).  . Cholecalciferol (VITAMIN D) 2000 UNITS CAPS Take 2,000 Units by mouth daily.  . divalproex (DEPAKOTE SPRINKLE) 125 MG capsule Take 500 mg by mouth 2 (two) times daily.   Marland Kitchen donepezil (ARICEPT) 10 MG tablet Take 10 mg by mouth daily with breakfast.  . famotidine (PEPCID) 20 MG tablet Take 20 mg by mouth at bedtime.   . feeding supplement, GLUCERNA SHAKE, (GLUCERNA SHAKE) LIQD Take 237 mLs by mouth 2 (two) times daily.  . ferrous sulfate 325 (65 FE) MG tablet Take 325 mg by mouth 2 (two) times daily with a meal.  . furosemide (LASIX) 20 MG tablet Take 10 mg by mouth daily as needed for fluid.   Marland Kitchen geriatric multivitamins-minerals (ELDERTONIC/GEVRABON) ELIX Take 15 mLs by mouth 3 (three) times daily.  Marland Kitchen loperamide (IMODIUM) 2 MG capsule Take 2 mg by mouth every 4 (four) hours as needed for diarrhea or loose stools (diarrhea).   . LORazepam (ATIVAN) 0.5 MG tablet TAKE 1 TABLET BY MOUTH EVERY EIGHT HOURS AS NEEDED FOR ANXIETY. (Patient taking differently: Take 0.25 mg by mouth 2 (two) times daily. )  . Melatonin 3 MG CAPS Take 6 mg  by mouth at bedtime.  . Multiple Vitamins-Minerals (I-VITE) TABS Take 1 tablet by mouth daily with breakfast.  . probenecid (BENEMID) 500 MG tablet Take 500 mg by mouth daily with breakfast.   . QUEtiapine (SEROQUEL) 25 MG tablet Take 1.5 tablets (37.5 mg total) by mouth 2 (two) times daily. (Patient taking differently: Take 12.5-25 mg by mouth 2 (two) times daily. 12.5 mg in the morning and 25 mg in the evening)  . cephALEXin  (KEFLEX) 500 MG capsule Take 1 capsule (500 mg total) by mouth 2 (two) times daily. (Patient not taking: Reported on 10/28/2015)    FAMILY HISTORY:  His indicated that his mother is deceased. He indicated that his father is deceased.   SOCIAL HISTORY: He  reports that he has never smoked. He has never used smokeless tobacco. He reports that he drinks alcohol. He reports that he does not use illicit drugs.  REVIEW OF SYSTEMS:   Unable due to encephalopathy  SUBJECTIVE:   VITAL SIGNS: BP 83/56 mmHg  Pulse 55  Temp(Src) 97.8 F (36.6 C) (Rectal)  Resp 28  Ht 5\' 8"  (1.727 m)  Wt 75 kg (165 lb 5.5 oz)  BMI 25.15 kg/m2  SpO2 94%  HEMODYNAMICS:    VENTILATOR SETTINGS: Vent Mode:  [-] PRVC FiO2 (%):  [40 %-100 %] 60 % Set Rate:  [16 bmp-22 bmp] 22 bmp Vt Set:  [560 mL] 560 mL PEEP:  [5 cmH20] 5 cmH20 Plateau Pressure:  [17 cmH20] 17 cmH20  INTAKE / OUTPUT: I/O last 3 completed shifts: In: 2000 [I.V.:2000] Out: 1250 [Other:1250]  PHYSICAL EXAMINATION: General:  Elderly male of normal body habitus Neuro:  Sedated on vent HEENT:  Combined [place]/AT, no JVD, PERRL.  Cardiovascular:  RRR, no MRG noted Lungs:  Coarse R base.  Abdomen:  Soft, non-tender, non-distended Musculoskeletal:  No acute deformity or edema Skin:  Grossly intact  LABS:  CBC  Recent Labs Lab 10/13/2015 1717 10/24/2015 1744  WBC 13.1*  --   HGB 12.2* 13.9  HCT 38.9* 41.0  PLT 255  --    Coag's No results for input(s): APTT, INR in the last 168 hours. BMET  Recent Labs Lab 10/08/2015 1717 10/27/2015 1744  NA 131* 139  K 4.0 4.1  CL 92* 97*  CO2 22  --   BUN 42* 45*  CREATININE 1.68* 1.40*  GLUCOSE 223* 221*   Electrolytes  Recent Labs Lab 10/13/2015 1717  CALCIUM 9.5   Sepsis Markers No results for input(s): LATICACIDVEN, PROCALCITON, O2SATVEN in the last 168 hours. ABG  Recent Labs Lab 10/20/2015 1830  PHART 7.277*  PCO2ART 59.0*  PO2ART 272.0*   Liver Enzymes  Recent Labs Lab  10/30/2015 1717  AST 30  ALT 23  ALKPHOS 73  BILITOT 0.4  ALBUMIN 3.0*   Cardiac Enzymes No results for input(s): TROPONINI, PROBNP in the last 168 hours. Glucose No results for input(s): GLUCAP in the last 168 hours.  Imaging Dg Chest Portable 1 View  10/22/2015  CLINICAL DATA:  Initial evaluation for central line placement. EXAM: PORTABLE CHEST 1 VIEW COMPARISON:  Prior radiograph from earlier the same day. FINDINGS: Patient remains intubated with the tip an endotracheal tube positioned 4.9 cm above the carina. Enteric tube courses into the abdomen. There has been interval placement of a right IJ approach its venous catheter with tip overlying the mid right atrium. Retraction by 4 cm is suggested. Cardiomegaly is stable. Mediastinal silhouette within normal limits. Lungs are well inflated. No significant interval change  in scattered bilateral airspace opacities. No pulmonary edema. No definite pleural effusion. Left costophrenic angle incompletely visualized. No pneumothorax. Osseous structures are unchanged. IMPRESSION: 1. Tip of right IJ approach central venous catheter overlying the mid right atrium. Retraction by approximately 4 cm is suggested. 2. Remaining support apparatus in good position. 3. No significant interval change in bilateral airspace opacities. Electronically Signed   By: Jeannine Boga M.D.   On: 10/15/2015 21:40   Dg Chest Portable 1 View  10/25/2015  CLINICAL DATA:  Verify ETT placement EXAM: PORTABLE CHEST 1 VIEW COMPARISON:  01/04/1929 16 FINDINGS: Endotracheal tube terminates 3.9 cm above carina.Nasogastric tube extends beyond the inferior aspect of the film. Cardiomegaly accentuated by AP portable technique. Patient rotated minimally left. No pleural effusion or pneumothorax. Probable artifactual opacity projecting over the right upper lobe medially. Patchy bibasilar airspace disease. Left hemidiaphragm elevation. IMPRESSION: Endotracheal tube appropriately  positioned. Bibasilar airspace opacities, suspicious for infection or aspiration. Electronically Signed   By: Abigail Miyamoto M.D.   On: 10/12/2015 17:45     STUDIES:  CT abd 11/28 >  CULTURES: BCx2 11/28 >>> Urine Cx 11/28 >>> Tracheal aspirate 11/28 >>>  ANTIBIOTICS: Zosyn 11/28 >>> Vancomycin 11/28 >>>  SIGNIFICANT EVENTS: 11/28 > admit  LINES/TUBES: RIJ CVL 11//28 >>>  DISCUSSION: 81 year old male from SNF with advanced dementia. Presented with respiratory distress after several episodes of coffee-ground emesis. She required intubation the emergency department due to hypoxemia. Chest x-ray consistent with right lower lobe opacification concerning for aspiration pneumonia. Gastric emesis is feculent in nature raising concern for bowel obstruction. Will initially treat him broadly with antibiotic coverage for HCAP. CT abdomen pending to assess for obstruction. Continue full vent support. Patient is DO NOT RESUSCITATE.   ASSESSMENT / PLAN:  PULMONARY A: Acute hypoxemic/hypercarbic respiratory failure secondary to aspiration pneumonia vs HCAP Pneumonia  P:   Full vent support Follow ABG Chest x-ray in the morning VAP bundle  CARDIOVASCULAR A:  Hypotension - hypovolemia versus sepsis H/o hypertension  P:  Telemetry monitoring M AP goal greater than 65 mmHg Aggressive volume resuscitation CVP monitoring CVL placed in ED, may need pressors. Check lactic acid Trend troponin DO NOT RESUSCITATE in the event of cardiac arrest Holding lisinopril  RENAL A:   Acute renal failure suspect secondary to dehydration  P:   IVF hydration Trend basic metabolic panel and renal function Replace electrolytes as indicated  GASTROINTESTINAL A:   GI bleeding - gastroccult positive Possible bowel obstruction Protein calorie malnutrition  P:   Nothing by mouth Consult GI in AM NG tube to low intermittent suction CT abdomen without contrast due to poor renal function,  surgical consultation if shows obstruction Pepcid for stress ulcer prophylaxis Gastroccult testing  HEMATOLOGIC A:   Anemia, chronic  P:  Follow CBC SCD Hold DVT chemoprophylaxis in setting GIB  INFECTIOUS A:   HCAP versus aspiration pneumonia  P:   Trend WBC and fever curve Antibiotics as above Zosyn, vancomycin Trend cultures as above Pro-calcitonin Low threshold to de-escalate antibiotics  ENDOCRINE A:   Hyperglycemia without history of diabetes mellitus  P:   CBG monitoring and sliding scale insulin  NEUROLOGIC A:   Acute metabolic encephalopathy  P:   RASS goal: -1 to -2 Fentanyl infusion for analgesia/sedation When necessary Versed for breakthrough agitation Daily wake up assessment Depakote level Holding seroquel, aricept, promenecid, depakote, ativan   FAMILY  - Updates: Discussion with wife via telephone 11/28. Updated her regarding her husband's condition and our plan  for him. She informs me that he has established a DO NOT RESUSCITATE order. She would not endorse life-saving measures in the event of a cardiac arrest.  - Inter-disciplinary family meet or Palliative Care meeting due by:  12/5  APP Critical care time: 49 mins  Georgann Housekeeper, AGACNP-BC [facility] Pulmonology/Critical Care Pager [phone] or [phone]  10/18/2015 10:30 PM

## 2015-10-29 NOTE — ED Notes (Signed)
This NT and [place] G, RN attempted temp foley. Hit resistance, penis had a little bleeding when removing foley.

## 2015-10-29 NOTE — ED Provider Notes (Signed)
CSN: [id number]     Arrival date & time 10/20/2015  1700 History   First MD Initiated Contact with Patient 10/03/2015 1700     Chief Complaint  Patient presents with  . Respiratory Distress     (Consider location/radiation/quality/duration/timing/severity/associated sxs/prior Treatment) HPI  Patient is an 81 year old male with past medical history significant for advanced fluid body dementia, hypertension, gastritis, who presents to the emergency department for respiratory distress. Patient comes from a nursing facility, per report he is "not quite acting himself for about a week", worsening fatigue and lethargy over the past couple of days. Last BM a couple of days ago. When EMS arrived he had an episode of emesis followed by respiratory distress. SpO2 in the mid 70s. Placed on a NRB mask, SpO2 improved to mid90s. Patient unable to follow commands or answer questions.   Past Medical History  Diagnosis Date  . Hypertension   . Gout   . Hypercholesteremia   . Arthritis   . Acid reflux   . Cancer ([place])     Cancer-prostate  . Edema   . Psoriasis   . Alzheimer disease    Past Surgical History  Procedure Laterality Date  . Cataract extraction w/phaco  07/01/2012    Procedure: CATARACT EXTRACTION PHACO AND INTRAOCULAR LENS PLACEMENT (IOC);  Surgeon: Tonny Branch, MD;  Location: [facility];  Service: Ophthalmology;  Laterality: Right;  CDE 19.91  . Cataract extraction w/phaco  09/02/2012    Procedure: CATARACT EXTRACTION PHACO AND INTRAOCULAR LENS PLACEMENT (IOC);  Surgeon: Tonny Branch, MD;  Location: [facility];  Service: Ophthalmology;  Laterality: Left;  CDE 16.23  . Tonsillectomy    . Esophageal dilation     Family History  Problem Relation Age of Onset  . Heart attack Mother   . Stroke Father    Social History  Substance Use Topics  . Smoking status: Never Smoker   . Smokeless tobacco: Never Used  . Alcohol Use: Yes     Comment: 2 glasses of wine daily    Review of Systems  Unable to  perform ROS: Mental status change      Allergies  Review of patient's allergies indicates no known allergies.  Home Medications   Prior to Admission medications   Medication Sig Start Date End Date Taking? Authorizing Provider  acetaminophen (TYLENOL) 325 MG tablet Take 650 mg by mouth every 6 (six) hours as needed for moderate pain (pain).    Yes Historical Provider, MD  calcium-vitamin D (OSCAL WITH D) 500-200 MG-UNIT per tablet Take 1 tablet by mouth 2 (two) times daily.   Yes Historical Provider, MD  chlorproMAZINE (THORAZINE) 25 MG tablet Take 25 mg by mouth every 6 (six) hours as needed (for hiccups).   Yes Historical Provider, MD  Cholecalciferol (VITAMIN D) 2000 UNITS CAPS Take 2,000 Units by mouth daily.   Yes Historical Provider, MD  divalproex (DEPAKOTE SPRINKLE) 125 MG capsule Take 500 mg by mouth 2 (two) times daily.    Yes Historical Provider, MD  donepezil (ARICEPT) 10 MG tablet Take 10 mg by mouth daily with breakfast.   Yes Historical Provider, MD  famotidine (PEPCID) 20 MG tablet Take 20 mg by mouth at bedtime.    Yes Historical Provider, MD  feeding supplement, GLUCERNA SHAKE, (GLUCERNA SHAKE) LIQD Take 237 mLs by mouth 2 (two) times daily.   Yes Historical Provider, MD  ferrous sulfate 325 (65 FE) MG tablet Take 325 mg by mouth 2 (two) times daily with a meal.  Yes Historical Provider, MD  folic acid (FOLVITE) 1 MG tablet Take 1 mg by mouth daily.   Yes Historical Provider, MD  furosemide (LASIX) 20 MG tablet Take 10 mg by mouth daily as needed for fluid.    Yes Historical Provider, MD  geriatric multivitamins-minerals (ELDERTONIC/GEVRABON) ELIX Take 15 mLs by mouth 3 (three) times daily.   Yes Historical Provider, MD  lisinopril (PRINIVIL,ZESTRIL) 2.5 MG tablet Take 2.5 mg by mouth daily.   Yes Historical Provider, MD  loperamide (IMODIUM) 2 MG capsule Take 2 mg by mouth every 4 (four) hours as needed for diarrhea or loose stools (diarrhea).    Yes Historical Provider,  MD  LORazepam (ATIVAN) 0.5 MG tablet TAKE 1 TABLET BY MOUTH EVERY EIGHT HOURS AS NEEDED FOR ANXIETY. Patient taking differently: Take 0.25 mg by mouth 2 (two) times daily.  06/21/15  Yes Nishant Dhungel, MD  Melatonin 3 MG CAPS Take 6 mg by mouth at bedtime.   Yes Historical Provider, MD  Multiple Vitamins-Minerals (I-VITE) TABS Take 1 tablet by mouth daily with breakfast.   Yes Historical Provider, MD  probenecid (BENEMID) 500 MG tablet Take 500 mg by mouth daily with breakfast.    Yes Historical Provider, MD  QUEtiapine (SEROQUEL) 25 MG tablet Take 1.5 tablets (37.5 mg total) by mouth 2 (two) times daily. Patient taking differently: Take 12.5-25 mg by mouth 2 (two) times daily. 12.5 mg in the morning and 25 mg in the evening 06/21/15  Yes Nishant Dhungel, MD  traZODone (DESYREL) 50 MG tablet Take 50 mg by mouth at bedtime as needed for sleep.   Yes Historical Provider, MD  vitamin B-12 (CYANOCOBALAMIN) 1000 MCG tablet Place 1,000 mcg under the tongue daily.   Yes Historical Provider, MD  cephALEXin (KEFLEX) 500 MG capsule Take 1 capsule (500 mg total) by mouth 2 (two) times daily. Patient not taking: Reported on 10/28/2015 06/21/15   Nishant Dhungel, MD   BP 107/61 mmHg  Pulse 38  Temp(Src) 97.9 F (36.6 C) (Oral)  Resp 33  Ht 5\' 8"  (1.727 m)  Wt 70.1 kg  BMI 23.50 kg/m2  SpO2 100% Physical Exam  Constitutional: He appears lethargic. He appears distressed.  HENT:  Head: Normocephalic and atraumatic.  Mouth/Throat: Oropharynx is clear and moist.  Eyes: Conjunctivae and EOM are normal. Pupils are equal, round, and reactive to light.  Neck: No JVD present. No tracheal deviation present.  Severe kyphosis  Cardiovascular: Normal rate, regular rhythm, normal heart sounds and intact distal pulses.   No murmur heard. Pulmonary/Chest: Breath sounds normal. No stridor. He is in respiratory distress.  Course breath sounds throughout  Abdominal: Soft. He exhibits no distension. There is no  tenderness. There is no rebound and no guarding.  Musculoskeletal: Normal range of motion.  Neurological: He appears lethargic. GCS eye subscore is 2. GCS verbal subscore is 3. GCS motor subscore is 4.  Skin: Skin is dry. No pallor.  Psychiatric: He has a normal mood and affect.  Nursing note and vitals reviewed.   ED Course  .Intubation Date/Time: 10/30/2015 12:39 AM Performed by: Nathaniel Man Authorized by: Nathaniel Man Consent: The procedure was performed in an emergent situation. Patient identity confirmed: verbally with patient, arm band and anonymous protocol, patient vented/unresponsive Indications: respiratory distress Intubation method: direct Patient status: paralyzed (RSI) Sedatives: etomidate Paralytic: rocuronium Laryngoscope size: Mac 3 Tube size: 7.5 mm Tube type: cuffed Number of attempts: 1 Cricoid pressure: yes Cords visualized: no Post-procedure assessment: chest rise,  ETCO2 monitor and  CO2 detector Breath sounds: equal and absent over the epigastrium Cuff inflated: yes ETT to lip: 24 cm Tube secured with: ETT holder Chest x-ray interpreted by me and radiologist. Chest x-ray findings: endotracheal tube in appropriate position Patient tolerance: Patient tolerated the procedure well with no immediate complications  .Central Line Date/Time: 10/30/2015 12:40 AM Performed by: Nathaniel Man Authorized by: Nathaniel Man Consent: The procedure was performed in an emergent situation. Required items: required blood products, implants, devices, and special equipment available Patient identity confirmed: verbally with patient, arm band and hospital-assigned identification number Indications: vascular access Anesthesia: local infiltration Local anesthetic: lidocaine 1% without epinephrine Anesthetic total: 3 ml Preparation: skin prepped with 2% chlorhexidine Skin prep agent dried: skin prep agent completely dried prior to procedure Sterile barriers: all  five maximum sterile barriers used - cap, mask, sterile gown, sterile gloves, and large sterile sheet Hand hygiene: hand hygiene performed prior to central venous catheter insertion Location details: right internal jugular Patient position: Trendelenburg Catheter type: triple lumen Pre-procedure: landmarks identified Ultrasound guidance: yes Sterile ultrasound techniques: sterile gel and sterile probe covers were used Number of attempts: 2 Successful placement: yes Post-procedure: line sutured and dressing applied Assessment: blood return through all ports,  free fluid flow,  placement verified by x-ray and no pneumothorax on x-ray Patient tolerance: Patient tolerated the procedure well with no immediate complications   (including critical care time) Labs Review Labs Reviewed  CBC - Abnormal; Notable for the following:    WBC 13.1 (*)    RBC 3.79 (*)    Hemoglobin 12.2 (*)    HCT 38.9 (*)    MCV 102.6 (*)    All other components within normal limits  COMPREHENSIVE METABOLIC PANEL - Abnormal; Notable for the following:    Sodium 131 (*)    Chloride 92 (*)    Glucose, Bld 223 (*)    BUN 42 (*)    Creatinine, Ser 1.68 (*)    Total Protein 6.4 (*)    Albumin 3.0 (*)    GFR calc non Af Amer 35 (*)    GFR calc Af Amer 41 (*)    Anion gap 17 (*)    All other components within normal limits  OCCULT BLOOD GASTRIC / DUODENUM (SPECIMEN CUP) - Abnormal; Notable for the following:    Occult Blood, Gastric POSITIVE (*)    All other components within normal limits  URINALYSIS, ROUTINE W REFLEX MICROSCOPIC (NOT AT [facility]) - Abnormal; Notable for the following:    APPearance CLOUDY (*)    Hgb urine dipstick MODERATE (*)    All other components within normal limits  URINE MICROSCOPIC-ADD ON - Abnormal; Notable for the following:    Squamous Epithelial / LPF 0-5 (*)    Casts HYALINE CASTS (*)    All other components within normal limits  I-STAT CHEM 8, ED - Abnormal; Notable for the  following:    Chloride 97 (*)    BUN 45 (*)    Creatinine, Ser 1.40 (*)    Glucose, Bld 221 (*)    All other components within normal limits  I-STAT ARTERIAL BLOOD GAS, ED - Abnormal; Notable for the following:    pH, Arterial 7.277 (*)    pCO2 arterial 59.0 (*)    pO2, Arterial 272.0 (*)    Bicarbonate 27.5 (*)    All other components within normal limits  I-STAT ARTERIAL BLOOD GAS, ED - Abnormal; Notable for the following:    pO2, Arterial 61.0 (*)  Bicarbonate 24.4 (*)    All other components within normal limits  CULTURE, BLOOD (ROUTINE X 2)  CULTURE, BLOOD (ROUTINE X 2)  URINE CULTURE  CULTURE, RESPIRATORY (NON-EXPECTORATED)  MRSA PCR SCREENING  PROCALCITONIN  VALPROIC ACID LEVEL  BASIC METABOLIC PANEL  CBC  MAGNESIUM  PHOSPHORUS  LACTIC ACID, PLASMA  TROPONIN I  TROPONIN I  PROCALCITONIN  TYPE AND SCREEN  ABO/RH    Imaging Review Ct Abdomen Pelvis Wo Contrast  10/22/2015  CLINICAL DATA:  Sepsis.  Emesis. EXAM: CT ABDOMEN AND PELVIS WITHOUT CONTRAST TECHNIQUE: Multidetector CT imaging of the abdomen and pelvis was performed following the standard protocol without IV contrast. COMPARISON:  None. FINDINGS: There are unremarkable unenhanced appearances of the liver, with moderate motion degradation of the upper abdominal images. Spleen, pancreas, adrenals and kidneys are also grossly unremarkable. Bowel is unremarkable. There is no evidence of obstruction. There is no perforation. There is a nasogastric tube extending into the stomach. The abdominal aorta is normal in caliber with mild atherosclerotic calcification. Urinary bladder is collapsed around a Foley catheter. No acute inflammatory changes are evident in the abdomen or pelvis. There is dense consolidation in the posterior lower lobes of both lungs, likely pneumonia or aspiration. Trace pleural fluid on the right. There is moderately severe compression of the T12 vertebral body, probably mildly worsened from  03/28/2015. IMPRESSION: 1. Confluent airspace consolidation in both lower lobes. This likely represents pneumonia. Aspiration is also possibility. Pulmonary hemorrhage less likely. 2. No acute inflammatory changes are evident in the abdomen or pelvis. No bowel obstruction or perforation. Mild motion degradation of the images, particularly in the upper abdomen. 3. Mildly worsened compression of the T12 vertebral body, chronicity indeterminate. Electronically Signed   By: Andreas Newport M.D.   On: 10/30/2015 22:46   Dg Chest Portable 1 View  10/21/2015  CLINICAL DATA:  Initial evaluation for central line placement. EXAM: PORTABLE CHEST 1 VIEW COMPARISON:  Prior radiograph from earlier the same day. FINDINGS: Patient remains intubated with the tip an endotracheal tube positioned 4.9 cm above the carina. Enteric tube courses into the abdomen. There has been interval placement of a right IJ approach its venous catheter with tip overlying the mid right atrium. Retraction by 4 cm is suggested. Cardiomegaly is stable. Mediastinal silhouette within normal limits. Lungs are well inflated. No significant interval change in scattered bilateral airspace opacities. No pulmonary edema. No definite pleural effusion. Left costophrenic angle incompletely visualized. No pneumothorax. Osseous structures are unchanged. IMPRESSION: 1. Tip of right IJ approach central venous catheter overlying the mid right atrium. Retraction by approximately 4 cm is suggested. 2. Remaining support apparatus in good position. 3. No significant interval change in bilateral airspace opacities. Electronically Signed   By: Jeannine Boga M.D.   On: 10/23/2015 21:40   Dg Chest Portable 1 View  10/30/2015  CLINICAL DATA:  Verify ETT placement EXAM: PORTABLE CHEST 1 VIEW COMPARISON:  01/04/1929 16 FINDINGS: Endotracheal tube terminates 3.9 cm above carina.Nasogastric tube extends beyond the inferior aspect of the film. Cardiomegaly accentuated  by AP portable technique. Patient rotated minimally left. No pleural effusion or pneumothorax. Probable artifactual opacity projecting over the right upper lobe medially. Patchy bibasilar airspace disease. Left hemidiaphragm elevation. IMPRESSION: Endotracheal tube appropriately positioned. Bibasilar airspace opacities, suspicious for infection or aspiration. Electronically Signed   By: Abigail Miyamoto M.D.   On: 10/05/2015 17:45   I have personally reviewed and evaluated these images and lab results as part of my medical decision-making.  EKG Interpretation   Date/Time:  Monday October 29 2015 17:26:33 EST Ventricular Rate:  157 PR Interval:  87 QRS Duration: 85 QT Interval:  319 QTC Calculation: 516 R Axis:   14 Text Interpretation:  Sinus tachycardia Inferior infarct, acute (RCA)  Probable RV involvement, suggest recording right precordial leads Baseline  wander in lead(s) V3 Confirmed by Debby Freiberg [phone]) on 10/28/2015  8:06:28 PM      MDM   Final diagnoses:  None   Patient is an 81 year old male with past medical history significant for advanced fluid body dementia, hypertension, gastritis, who presents to the emergency department for respiratory distress. Initial SpO2 mid70s, placed on NRB mask by EMS. On arrival, patient appears toxic, lethargic. GCS 8, not able to follow commands. Normothermic. HR 110s, BP 78/59, RR 40. Active full code in the chart.   2 PIV obtained, immediately given bolus NS. Patient's clinical picture concerning for severe sepsis with likely aspiration vs HCAP as etiology of respiratory distress. Patient was intubated for airway protection and respiratory distress, see procedure not for detail. OG tube placed had immediate return of 1300 mL of dark brown stomach contents.  Blood and urine cultures obtained. Given >  30 cc/kg NS. Found to have leukocytosis 13.1. Chest x-ray concerning for aspiration versus pneumonia. Started on vancomycin and  Zosyn.  Patient continued to be hypotensive despite fluid resuscitation. Patient in septic shock. Right IJ central line placed, procedure note for detail. Started on Levophed gtt with improvement of BP.    CT abdomen and pelvis obtained due to concerns for intra-abdominal infection versus SBO. Showed no acute findings.  Patient admitted to the ICU for further management.       Nathaniel Man, MD 10/30/15 [id number]  Debby Freiberg, MD 11/02/15 [phone]

## 2015-10-30 ENCOUNTER — Inpatient Hospital Stay (HOSPITAL_COMMUNITY): Payer: Medicare Other

## 2015-10-30 DIAGNOSIS — J9601 Acute respiratory failure with hypoxia: Secondary | ICD-10-CM

## 2015-10-30 DIAGNOSIS — N179 Acute kidney failure, unspecified: Secondary | ICD-10-CM

## 2015-10-30 LAB — GLUCOSE, CAPILLARY
GLUCOSE-CAPILLARY: 84 mg/dL (ref 65–99)
GLUCOSE-CAPILLARY: 81 mg/dL (ref 65–99)
Glucose-Capillary: 93 mg/dL (ref 65–99)

## 2015-10-30 LAB — BASIC METABOLIC PANEL
Anion gap: 8 (ref 5–15)
BUN: 42 mg/dL — AB (ref 6–20)
CHLORIDE: 110 mmol/L (ref 101–111)
CO2: 25 mmol/L (ref 22–32)
CREATININE: 1.4 mg/dL — AB (ref 0.61–1.24)
Calcium: 8.1 mg/dL — ABNORMAL LOW (ref 8.9–10.3)
GFR calc Af Amer: 51 mL/min — ABNORMAL LOW (ref >60)
GFR calc non Af Amer: 44 mL/min — ABNORMAL LOW (ref >60)
Glucose, Bld: 91 mg/dL (ref 65–99)
Potassium: 4.1 mmol/L (ref 3.5–5.1)
SODIUM: 143 mmol/L (ref 135–145)

## 2015-10-30 LAB — TROPONIN I
TROPONIN I: 0.09 ng/mL — AB (ref <0.031)
TROPONIN I: 0.09 ng/mL — AB (ref <0.031)

## 2015-10-30 LAB — LACTIC ACID, PLASMA
LACTIC ACID, VENOUS: 2.6 mmol/L — AB (ref 0.5–2.0)
LACTIC ACID, VENOUS: 2.7 mmol/L — AB (ref 0.5–2.0)
LACTIC ACID, VENOUS: 3.1 mmol/L — AB (ref 0.5–2.0)

## 2015-10-30 LAB — MRSA PCR SCREENING: MRSA by PCR: NEGATIVE

## 2015-10-30 LAB — PHOSPHORUS: Phosphorus: 2.5 mg/dL (ref 2.5–4.6)

## 2015-10-30 LAB — MAGNESIUM: MAGNESIUM: 1.5 mg/dL — AB (ref 1.7–2.4)

## 2015-10-30 LAB — PROCALCITONIN
Procalcitonin: 0.54 ng/mL
Procalcitonin: 11.98 ng/mL

## 2015-10-30 MED ORDER — WHITE PETROLATUM GEL
Status: AC
  Administered 2015-10-30: 1
  Filled 2015-10-30: qty 1

## 2015-10-30 MED ORDER — SODIUM CHLORIDE 0.9 % IV SOLN
100.0000 ug/h | INTRAVENOUS | Status: DC
  Administered 2015-10-31 (×2): 350 ug/h via INTRAVENOUS
  Filled 2015-10-30 (×3): qty 50

## 2015-10-30 MED ORDER — FENTANYL BOLUS VIA INFUSION
50.0000 ug | INTRAVENOUS | Status: DC | PRN
  Filled 2015-10-30: qty 200

## 2015-10-30 MED ORDER — FAMOTIDINE IN NACL 20-0.9 MG/50ML-% IV SOLN: 20.0000 mg | INTRAVENOUS | Status: DC

## 2015-10-30 MED ORDER — CHLORHEXIDINE GLUCONATE 0.12% ORAL RINSE (MEDLINE KIT)
15.0000 mL | Freq: Two times a day (BID) | OROMUCOSAL | Status: DC
  Administered 2015-10-30 – 2015-10-31 (×4): 15 mL via OROMUCOSAL

## 2015-10-30 MED ORDER — ANTISEPTIC ORAL RINSE SOLUTION (CORINZ)
7.0000 mL | Freq: Four times a day (QID) | OROMUCOSAL | Status: DC
  Administered 2015-10-30 – 2015-11-01 (×7): 7 mL via OROMUCOSAL

## 2015-10-30 MED ORDER — MIDAZOLAM HCL 2 MG/2ML IJ SOLN
1.0000 mg | INTRAMUSCULAR | Status: DC | PRN
  Administered 2015-10-30 – 2015-10-31 (×5): 2 mg via INTRAVENOUS
  Filled 2015-10-30 (×5): qty 2

## 2015-10-30 MED ORDER — PANTOPRAZOLE SODIUM 40 MG IV SOLR: 40.0000 mg | Freq: Two times a day (BID) | INTRAVENOUS | Status: DC

## 2015-10-30 NOTE — Care Management Note (Signed)
Case Management Note  Patient Details  Name: William Ramos MRN: [id number] Date of Birth: 02-24-1928  Subjective/Objective:   Has wife, in SNF since CVA.  SW consult placed.                   Action/Plan:   Expected Discharge Date:                  Expected Discharge Plan:  Home w Hospice Care  In-House Referral:     Discharge planning Services  CM Consult  Post Acute Care Choice:    Choice offered to:     DME Arranged:    DME Agency:     HH Arranged:    HH Agency:     Status of Service:  In process, will continue to follow  Medicare Important Message Given:    Date Medicare IM Given:    Medicare IM give by:    Date Additional Medicare IM Given:    Additional Medicare Important Message give by:     If discussed at [place] of Stay Meetings, dates discussed:    Additional Comments:  Vergie Living, RN 10/30/2015, 12:07 PM

## 2015-10-30 NOTE — Procedures (Signed)
Extubation Procedure Note  Patient Details:   Name: William Ramos DOB: [birth year] MRN: [id number]   Airway Documentation:     Evaluation  O2 sats: transiently fell during during procedure Complications: Complications of terminal extubation. Patient did not tolerate procedure well. Bilateral Breath Sounds: Clear, Diminished Suctioning: Airway No  Terminal extubation to Room Air per family request.  Dulcy Fanny 10/30/2015, 10:00 PM

## 2015-10-30 NOTE — Progress Notes (Signed)
Nutrition Note  Chart reviewed due to patient on ventilator. Per discussion with RN, plans for no escalation of care. No feedings planned at this time. Please consult if nutrition concerns arise.   Molli Barrows, RD, LDN, [place] Pager [phone] After Hours Pager [phone]

## 2015-10-30 NOTE — Progress Notes (Signed)
   10/30/15 1200  Clinical Encounter Type  Visited With Patient and family together;Health care provider  Visit Type Initial;Psychological support;Spiritual support;Social support;Critical Care  Referral From Physician  Consult/Referral To Chaplain  Spiritual Encounters  Spiritual Needs Emotional  Stress Factors  Patient Stress Factors Not reviewed  Family Stress Factors Health changes;Major life changes  Advance Directives (For Healthcare)  Does patient have an advance directive? Yes   Chaplain stopped by to talk with family.Wife was at bedside and wife stateed that "she is ready to let her husband go". She also stated, "making her husband comfortable is the best thing to do now,". Wife is waiting for her daughter to get here. Please call/page the chaplain if they need ANY emotional support. Chaplain is here to help.

## 2015-10-30 NOTE — Progress Notes (Signed)
CRITICAL VALUE ALERT  Critical value received:  Lactic acid 2.6  Date of notification:  10/30/15  Time of notification:  0831  Critical value read back: yes  Nurse who received alert:  Allegra Grana  MD notified (1st page):  Dr. Ashok Cordia  Time of first page:  In person  MD notified (2nd page):  Time of second page:  Responding MD:    Time MD responded:

## 2015-10-30 NOTE — Progress Notes (Signed)
Patient's lactic acid elevated to 3.1. He has received 5.5L NS in the ED. Blood cultures have been obtained, he is on vanc/zosyn. BPs stable. Goal MAP >60; if less than 60 x 2 will start low dose levophed.  Trend lactic acid  Will need to address goals of care in the AM.  Archie Patten, MD [facility] Family Medicine Resident  10/30/2015, 1:49 AM

## 2015-10-30 NOTE — Progress Notes (Signed)
PULMONARY / CRITICAL CARE MEDICINE   Name: William Ramos MRN: [id number] DOB: 13-Jun-1928    ADMISSION DATE:  10/11/2015 CONSULTATION DATE:  11/28  REFERRING MD:  EDP  CHIEF COMPLAINT: Respiratory distress  SUBJECTIVE:  No acute events overnight. Patient has escalating vasopressor requirements.  REVIEW OF SYSTEMS:  Unable due to encephalopathy and intubation.  VITAL SIGNS: BP 98/47 mmHg  Pulse 48  Temp(Src) 97.8 F (36.6 C) (Oral)  Resp 17  Ht 5\' 8"  (1.727 m)  Wt 154 lb 8.7 oz (70.1 kg)  BMI 23.50 kg/m2  SpO2 99%  HEMODYNAMICS: CVP:  [9 mmHg-11 mmHg] 11 mmHg  VENTILATOR SETTINGS: Vent Mode:  [-] PRVC FiO2 (%):  [40 %-100 %] 40 % Set Rate:  [16 bmp-22 bmp] 16 bmp Vt Set:  [560 mL] 560 mL PEEP:  [5 cmH20] 5 cmH20 Plateau Pressure:  [10 cmH20-19 cmH20] 18 cmH20  INTAKE / OUTPUT: I/O last 3 completed shifts: In: 5204.1 [I.V.:5104.1; IV Piggyback:100] Out: 1960 [Urine:710; Other:1250]  PHYSICAL EXAMINATION: General:  No acute distress. Eyes closed. Wife at bedside.  Integument:  Warm & dry. No rash on exposed skin. Marland Kitchen HEENT:  No scleral injection or icterus. Endotracheal tube in place. PERRL. Cardiovascular:  Regular rate. No appreciable JVD.  Pulmonary:  Coarse breath sounds bilaterally. Symmetric chest wall rise on ventilator. Abdomen: Soft. Normal bowel sounds. Nondistended.  Neurological:  Patient altered. Not moving extremities spontaneously. Not following commands.  LABS:  CBC  Recent Labs Lab 10/16/2015 1717 10/06/2015 1744 10/30/15 0427  WBC 13.1*  --  3.6*  HGB 12.2* 13.9 8.2*  HCT 38.9* 41.0 24.6*  PLT 255  --  118*   Coag's No results for input(s): APTT, INR in the last 168 hours. BMET  Recent Labs Lab 10/08/2015 1717 10/03/2015 1744 10/30/15 0427  NA 131* 139 143  K 4.0 4.1 4.1  CL 92* 97* 110  CO2 22  --  25  BUN 42* 45* 42*  CREATININE 1.68* 1.40* 1.40*  GLUCOSE 223* 221* 91   Electrolytes  Recent Labs Lab 10/24/2015 1717  10/30/15 0427  CALCIUM 9.5 8.1*  MG  --  1.5*  PHOS  --  2.5   Sepsis Markers  Recent Labs Lab 10/15/2015 2312 10/30/15 0040 10/30/15 0427 10/30/15 0429 10/30/15 0752  LATICACIDVEN  --  3.1*  --  2.7* 2.6*  PROCALCITON 0.54  --  11.98  --   --    ABG  Recent Labs Lab 10/20/2015 1830 10/28/2015 2247  PHART 7.277* 7.355  PCO2ART 59.0* 43.5  PO2ART 272.0* 61.0*   Liver Enzymes  Recent Labs Lab 10/23/2015 1717  AST 30  ALT 23  ALKPHOS 73  BILITOT 0.4  ALBUMIN 3.0*   Cardiac Enzymes  Recent Labs Lab 10/30/15 0428 10/30/15 1025  TROPONINI 0.09* 0.09*   Glucose  Recent Labs Lab 10/30/15 0031 10/30/15 0343 10/30/15 0823  GLUCAP 93 84 81    Imaging Ct Abdomen Pelvis Wo Contrast  10/09/2015  CLINICAL DATA:  Sepsis.  Emesis. EXAM: CT ABDOMEN AND PELVIS WITHOUT CONTRAST TECHNIQUE: Multidetector CT imaging of the abdomen and pelvis was performed following the standard protocol without IV contrast. COMPARISON:  None. FINDINGS: There are unremarkable unenhanced appearances of the liver, with moderate motion degradation of the upper abdominal images. Spleen, pancreas, adrenals and kidneys are also grossly unremarkable. Bowel is unremarkable. There is no evidence of obstruction. There is no perforation. There is a nasogastric tube extending into the stomach. The abdominal aorta is normal in caliber  with mild atherosclerotic calcification. Urinary bladder is collapsed around a Foley catheter. No acute inflammatory changes are evident in the abdomen or pelvis. There is dense consolidation in the posterior lower lobes of both lungs, likely pneumonia or aspiration. Trace pleural fluid on the right. There is moderately severe compression of the T12 vertebral body, probably mildly worsened from 03/28/2015. IMPRESSION: 1. Confluent airspace consolidation in both lower lobes. This likely represents pneumonia. Aspiration is also possibility. Pulmonary hemorrhage less likely. 2. No acute  inflammatory changes are evident in the abdomen or pelvis. No bowel obstruction or perforation. Mild motion degradation of the images, particularly in the upper abdomen. 3. Mildly worsened compression of the T12 vertebral body, chronicity indeterminate. Electronically Signed   By: Andreas Newport M.D.   On: 10/07/2015 22:46   Dg Chest Port 1 View  10/30/2015  CLINICAL DATA:  Acute respiratory failure, healthcare associated pneumonia, intubated patient. EXAM: PORTABLE CHEST 1 VIEW COMPARISON:  Portable chest x-ray of October 29, 2015 FINDINGS: The lungs are adequately inflated. The interstitial markings remain increased but have improved somewhat especially in the upper lobes. Coarse interstitial and alveolar opacities persist in the retrocardiac region on the left and to a lesser extent on the right. There is no significant pleural effusion there is no pneumothorax. The heart is normal in size. The pulmonary vascularity is prominent centrally without definite cephalization. The endotracheal tube tip lies 2.7 cm above the carina. The esophagogastric tube tip projects below the inferior margin of the image. The right internal jugular venous catheter tip projects at the cavoatrial junction. IMPRESSION: 1. Slight interval improvement in the appearance of the pulmonary interstitium suggesting improving interstitial pneumonia. Significant abnormalities remain at both lung bases however. 2. Persistent low positioning of the right internal jugular venous catheter. Withdrawal by 3-4 cm is recommended. Electronically Signed   By: David  Martinique M.D.   On: 10/30/2015 07:21   Dg Chest Portable 1 View  10/27/2015  CLINICAL DATA:  Initial evaluation for central line placement. EXAM: PORTABLE CHEST 1 VIEW COMPARISON:  Prior radiograph from earlier the same day. FINDINGS: Patient remains intubated with the tip an endotracheal tube positioned 4.9 cm above the carina. Enteric tube courses into the abdomen. There has been  interval placement of a right IJ approach its venous catheter with tip overlying the mid right atrium. Retraction by 4 cm is suggested. Cardiomegaly is stable. Mediastinal silhouette within normal limits. Lungs are well inflated. No significant interval change in scattered bilateral airspace opacities. No pulmonary edema. No definite pleural effusion. Left costophrenic angle incompletely visualized. No pneumothorax. Osseous structures are unchanged. IMPRESSION: 1. Tip of right IJ approach central venous catheter overlying the mid right atrium. Retraction by approximately 4 cm is suggested. 2. Remaining support apparatus in good position. 3. No significant interval change in bilateral airspace opacities. Electronically Signed   By: Jeannine Boga M.D.   On: 10/02/2015 21:40   Dg Chest Portable 1 View  10/21/2015  CLINICAL DATA:  Verify ETT placement EXAM: PORTABLE CHEST 1 VIEW COMPARISON:  01/04/1929 16 FINDINGS: Endotracheal tube terminates 3.9 cm above carina.Nasogastric tube extends beyond the inferior aspect of the film. Cardiomegaly accentuated by AP portable technique. Patient rotated minimally left. No pleural effusion or pneumothorax. Probable artifactual opacity projecting over the right upper lobe medially. Patchy bibasilar airspace disease. Left hemidiaphragm elevation. IMPRESSION: Endotracheal tube appropriately positioned. Bibasilar airspace opacities, suspicious for infection or aspiration. Electronically Signed   By: Abigail Miyamoto M.D.   On: 10/08/2015 17:45  STUDIES:  CT Abd/Pelvis 11/28: Consolidation bilateral lower lobes. No intrabdominal pathology. Worsened T12 compression.  CULTURES: BCx2 11/28 >>> Urine Cx 11/28 >>> Tracheal aspirate 11/28 >>>  ANTIBIOTICS: Zosyn 11/28 >>> Vancomycin 11/28 >>>  SIGNIFICANT EVENTS: 11/28 - admit  LINES/TUBES: RIJ CVL 11//28 >>>  ASSESSMENT / PLAN:  PULMONARY A: Acute Hypoxic Respiratory Failure - Secondary to Pneumonia  P:    Full vent support Follow ABG Chest x-ray in the morning VAP bundle  CARDIOVASCULAR A:  Septic Shock H/O Hypertension  P:  Telemetry monitoring Levophed gtt at 15 w/o escalation DO NOT RESUSCITATE in the event of cardiac arrest Holding lisinopril Plan for TTE if patient wish to continue aggressive care  RENAL A:   Acute Renal Failure - Improving. Possibly secondary to dehydration. Hypomagnesemia - Mild.  P:   Gentle IVF hydration Monitoring UOP with foley catheter Trending renal function with daily BUN/Creatinine Monitoring electrolytes daily Plan to replace magnesium if family wishes to continue aggressive care  GASTROINTESTINAL A:   Upper GIB - Coffee ground stomach contents & emesis. Protein calorie malnutrition  P:   NPO Holding on GI consult pending goals of care decision NG tube to low intermittent suction Switch Pepcid to Protonix IV q12hr  HEMATOLOGIC A:   Anemia - Worsening. Coffee ground emesis. Thrombocytopenia - Mild. Worsening.  P:  Trending Hgb w/ CBC daily Trending platelet count daily w/ CBC SCD Hold DVT chemoprophylaxis in setting GIB  INFECTIOUS A:   Septic Shock Pneumonia - HCAP vs Aspiration Pneumonia  P:   Day #2 of Vancomycin & Zosyn Awaiting culture results Procalcitonin per algorithm  ENDOCRINE A:   Hyperglycemia - No history of diabetes mellitus  P:   Accu-Cheks every 4 hours Low-dose sliding scale insulin  NEUROLOGIC A:   Encephalopathy - Toxic metabolic with possible anoxic injury. Advanced Dementia  P:   RASS goal: 0 to -1 Fentanyl gtt for pain relief Versed IV prn for agitation Holding seroquel, aricept, promenecid, depakote, ativan   FAMILY  - Updates: Lengthy discussion with the patient's son in [place] via phone, daughter at bedside, & wife at bedside. Family is further discussing goals of care.  - Inter-disciplinary family meet or Palliative Care meeting due by:  12/5  TODAY'S SUMMARY:  81 year old  male with advanced dementia. Worsening septic shock with continued hypoxic respiratory failure. Patient continues to have an encephalopathy on top of his underlying dementia. Family are further discussing his goals of care at this time with possible transition to comfort care. At this time the patient will remain DO NOT RESUSCITATE and we will not escalate his care further beyond what it is at currently.  I have spent a total of 50 minutes of critical care time today caring for the patient, reviewing his electronic medical record, and discussing his plan of care with family both at bedside & via phone.  Sonia Baller Ashok Cordia, M.D. [facility] Pulmonary & Critical Care Pager:  [phone] After 3pm or if no response, call [phone]  10/30/2015 4:57 PM

## 2015-10-30 NOTE — Progress Notes (Signed)
   10/30/15 2000  Clinical Encounter Type  Visited With Family  Visit Type Patient actively dying  Referral From Nurse  Spiritual Encounters  Spiritual Needs Prayer;Emotional;Grief support  Stress Factors  Family Stress Factors Loss;Other (Comment);Family relationships (Daughter alone)  Daughter of patient requested chaplain, prior to withdrawal of care. Visited with daughter multiple times while waiting for respiratory team until she indicated a desire to be alone with father; then offered prayer. Daughter indicated she would be on the phone with her brother when the time came. Mother had left, unable to watch. Reassured daughter about her decision to implement advanced directive.

## 2015-10-30 NOTE — Progress Notes (Signed)
Chaplain was paged to control Pt's care provider.Chaplain informed nurse Bonney Roussel is working a transition support. Nursing Assistant went to aid the situation. Nurse informed Chaplain he is suppose to be where she instructed Chaplain. Chaplain told her the transition support was a priority and departed. She told Chaplain he needs to find someone to help with this. Chaplain let said there was no one. Chaplain after completing support case  Received phone number  of Pt wife and tried to call with success. Chaplain call assisted living facility and asked if they would call family to inform them of the Pt's presence in the hospital. Chaplain informed charge nurse of his encounter with nurse.    10/30/15 0800  Clinical Encounter Type  Visited With Other (Comment)  Visit Type Spiritual support  Referral From Nurse  Spiritual Encounters  Spiritual Needs Other (Comment)

## 2015-10-30 NOTE — Plan of Care (Signed)
Problem: Phase I Progression Outcomes Goal: Voiding-avoid urinary catheter unless indicated Outcome: Not Met (add Reason) Urinary catheter in place.

## 2015-10-30 NOTE — Progress Notes (Signed)
PCCM Attending Family Discussion Note:  Patient's daughter at bedside confirms they are ready to proceed with palliation and comfort care. I have initiated the order set for withdrawal of care. Continuing fentanyl drip for relief of pain & dyspnea. Plan for terminal vent wean and extubation. Patient is DO NOT RESUSCITATE. Plan to discontinue vasopressor infusion at the time of extubation. Patient's wife does not wish to be present as he passes. Patient's daughter is going to contact his son via phone. We will request that the hospital chaplain be at bedside for patient's family support.  Sonia Baller Ashok Cordia, M.D. [facility] Pulmonary & Critical Care Pager:  [phone] After 3pm or if no response, call [phone]

## 2015-10-31 DIAGNOSIS — K922 Gastrointestinal hemorrhage, unspecified: Secondary | ICD-10-CM

## 2015-10-31 DIAGNOSIS — R6521 Severe sepsis with septic shock: Secondary | ICD-10-CM

## 2015-10-31 DIAGNOSIS — Z515 Encounter for palliative care: Secondary | ICD-10-CM

## 2015-10-31 DIAGNOSIS — A419 Sepsis, unspecified organism: Principal | ICD-10-CM

## 2015-10-31 LAB — CBC
HCT: 24.6 % — ABNORMAL LOW (ref 39.0–52.0)
HEMOGLOBIN: 8.2 g/dL — AB (ref 13.0–17.0)
MCH: 33.6 pg (ref 26.0–34.0)
MCHC: 33.3 g/dL (ref 30.0–36.0)
MCV: 100.8 fL — AB (ref 78.0–100.0)
PLATELETS: 118 10*3/uL — AB (ref 150–400)
RBC: 2.44 MIL/uL — AB (ref 4.22–5.81)
RDW: 13.9 % (ref 11.5–15.5)
WBC: 3.6 10*3/uL — AB (ref 4.0–10.5)

## 2015-10-31 LAB — URINE CULTURE: Culture: NO GROWTH

## 2015-10-31 LAB — GLUCOSE, CAPILLARY
GLUCOSE-CAPILLARY: 93 mg/dL (ref 65–99)
GLUCOSE-CAPILLARY: 106 mg/dL — AB (ref 65–99)

## 2015-10-31 MED ORDER — WHITE PETROLATUM GEL
Status: AC
  Administered 2015-10-31: 0.2
  Filled 2015-10-31: qty 1

## 2015-10-31 MED ORDER — MORPHINE SULFATE 25 MG/ML IV SOLN
10.0000 mg/h | INTRAVENOUS | Status: DC
  Administered 2015-10-31: 10 mg/h via INTRAVENOUS
  Filled 2015-10-31: qty 10

## 2015-10-31 MED ORDER — MORPHINE BOLUS VIA INFUSION
5.0000 mg | INTRAVENOUS | Status: DC | PRN
  Filled 2015-10-31: qty 20

## 2015-10-31 NOTE — Progress Notes (Signed)
PULMONARY / CRITICAL CARE MEDICINE   Name: William Ramos MRN: [id number] DOB: 02/01/1928    ADMISSION DATE:  10/04/2015 CONSULTATION DATE:  11/28  REFERRING MD:  EDP  CHIEF COMPLAINT: Respiratory distress  SUBJECTIVE:  Patient made full palliative care with terminal extubation on 11/29.  REVIEW OF SYSTEMS:  Unable due to encephalopathy & dementia.  VITAL SIGNS: BP 64/27 mmHg  Pulse 97  Temp(Src) 97.8 F (36.6 C) (Oral)  Resp 10  Ht 5\' 8"  (1.727 m)  Wt 166 lb 3.6 oz (75.4 kg)  BMI 25.28 kg/m2  SpO2 59%  HEMODYNAMICS: CVP:  [12 mmHg] 12 mmHg  VENTILATOR SETTINGS: Vent Mode:  [-] PRVC FiO2 (%):  [40 %] 40 % Set Rate:  [16 bmp] 16 bmp Vt Set:  [560 mL] 560 mL PEEP:  [5 cmH20] 5 cmH20 Plateau Pressure:  [18 cmH20] 18 cmH20  INTAKE / OUTPUT: I/O last 3 completed shifts: In: [id number] [I.V.:5214; NG/GT:80; IV Piggyback:100] Out: [id number] [Urine:1245]  PHYSICAL EXAMINATION: General:  No acute distress. Eyes closed. Family at bedside.  Integument:  Warm & dry. No rash on exposed skin. Marland Kitchen HEENT:  No scleral injection or icterus. Endotracheal tube in place. PERRL. Cardiovascular:  Regular rate. No appreciable JVD.  Pulmonary:  Coarse breath sounds bilaterally. Symmetric chest wall rise on ventilator. Abdomen: Soft. Normal bowel sounds. Nondistended.  Neurological:  Patient altered. Not moving extremities spontaneously. Not following commands.  LABS:  CBC  Recent Labs Lab 10/08/2015 1717 10/14/2015 1744 10/30/15 0427  WBC 13.1*  --  3.6*  HGB 12.2* 13.9 8.2*  HCT 38.9* 41.0 24.6*  PLT 255  --  118*   Coag's No results for input(s): APTT, INR in the last 168 hours. BMET  Recent Labs Lab 10/20/2015 1717 10/27/2015 1744 10/30/15 0427  NA 131* 139 143  K 4.0 4.1 4.1  CL 92* 97* 110  CO2 22  --  25  BUN 42* 45* 42*  CREATININE 1.68* 1.40* 1.40*  GLUCOSE 223* 221* 91   Electrolytes  Recent Labs Lab 10/30/2015 1717 10/30/15 0427  CALCIUM 9.5 8.1*  MG  --  1.5*   PHOS  --  2.5   Sepsis Markers  Recent Labs Lab 10/28/2015 2312 10/30/15 0040 10/30/15 0427 10/30/15 0429 10/30/15 0752  LATICACIDVEN  --  3.1*  --  2.7* 2.6*  PROCALCITON 0.54  --  11.98  --   --    ABG  Recent Labs Lab 10/19/2015 1830 10/12/2015 2247  PHART 7.277* 7.355  PCO2ART 59.0* 43.5  PO2ART 272.0* 61.0*   Liver Enzymes  Recent Labs Lab 10/08/2015 1717  AST 30  ALT 23  ALKPHOS 73  BILITOT 0.4  ALBUMIN 3.0*   Cardiac Enzymes  Recent Labs Lab 10/30/15 0428 10/30/15 1025  TROPONINI 0.09* 0.09*   Glucose  Recent Labs Lab 10/30/15 0031 10/30/15 0343 10/30/15 0823 10/30/15 1212 10/30/15 1618  GLUCAP 93 84 81 93 106*    Imaging No results found.   STUDIES:  CT Abd/Pelvis 11/28: Consolidation bilateral lower lobes. No intrabdominal pathology. Worsened T12 compression.  CULTURES: BCx2 11/28 >>> Tracheal aspirate 11/28 >>> Urine Cx 11/28:  Negative  ANTIBIOTICS: Zosyn 11/28 - 11/29 Vancomycin 11/28 - 11/29  SIGNIFICANT EVENTS: 11/28 - Admit 11/29 - Full Comfort & Terminal Extubation  LINES/TUBES: RIJ CVL 11//28 >>>  ASSESSMENT / PLAN:  Unfortunate 81 year old male with advanced dementia. Admitted with GIB that is likely upper in etiology with coffee ground emesis and bilateral pneumonia possibly secondary to  aspiration. Patient had worsening shock likely secondary to a combination of sepsis and GI bleeding. Family discussion was held by me yesterday and the patient was made full palliative care with terminal extubation. He has remained comfortable overnight and is being maintained on a fentanyl drip. Family were updated at bedside twice today by me regarding the continued plan of care and support.  1. Comfort Care/Palliation:  Patient remains DNR/DNI. Switching from Fentanyl gtt to Morphine gtt prior to transfer out of the ICU. Versed IV prn anxiety. Chaplain has already been made aware & supporting family. 2. Acute Hypoxic Respiratory Failure:   Secondary to pneumonia. Relief of dyspnea with Morphine/Fentanyl gtt & boluses as needed. 3. GIB:  Likely upper. No further treatment as patient is palliative.  4. Shock:  Likely combination hemorrhagic & septic. Continuing to monitor the patient on telemetry. Off vasopressors given palliative intent. 5. Code Status:  DNR/DNI. 6. Disposition:  Transitioning patient to medical telemetry bed. Leaving on PCCM service as I anticipate his passing.  Sonia Baller Ashok Cordia, M.D. [facility] Pulmonary & Critical Care Pager:  [phone] After 3pm or if no response, call [phone]  10/31/2015 2:26 PM

## 2015-11-01 ENCOUNTER — Telehealth: Payer: Self-pay

## 2015-11-01 LAB — CULTURE, RESPIRATORY W GRAM STAIN

## 2015-11-01 LAB — CULTURE, RESPIRATORY

## 2015-11-01 MED ORDER — SODIUM CHLORIDE 0.9 % IJ SOLN: 10.0000 mL | INTRAMUSCULAR | Status: DC | PRN

## 2015-11-01 MED ORDER — SCOPOLAMINE 1 MG/3DAYS TD PT72
1.0000 | MEDICATED_PATCH | TRANSDERMAL | Status: DC
  Filled 2015-11-01: qty 1

## 2015-11-01 MED ORDER — SODIUM CHLORIDE 0.9 % IJ SOLN: 10.0000 mL | Freq: Two times a day (BID) | INTRAMUSCULAR | Status: DC

## 2015-11-01 DEATH — deceased

## 2015-11-02 ENCOUNTER — Telehealth: Payer: Self-pay

## 2015-11-02 NOTE — Telephone Encounter (Signed)
On 11/02/2015 I received a death certificate from [place] (original). The death certificate is for cremation. The patient is a patient of [facility]. The death certificate will be taken to [facility] ([id number]) this am for signature. On 11/28/15 I received the death certificate back from [place]. I got the death certificate ready and called the funeral home to let them know I was mailing the death certificate to the [facility] Dept per their request.

## 2015-11-04 LAB — CULTURE, BLOOD (ROUTINE X 2)
CULTURE: NO GROWTH
Culture: NO GROWTH

## 2015-12-02 NOTE — Progress Notes (Signed)
Wasted 125 ml IV Morphine with Cybill Eckelmann from patient IV bag. Dorthey Sawyer, RN

## 2015-12-02 NOTE — Progress Notes (Signed)
Pt found to be unresponsive with no respirations, no pulse with pupils fixed and dilated. Cooling of the body noted throughout. Conformation of death with Arlyss Queen, RN and myself at [phone]. Page to Dr. Benjamine Mola Deterding, on-call for pulmonary critical care, for notification. Dr. Jimmy Footman notes that Dr. Blanchard Mane will be the signing physician of the death certificate. Call to patient's spouse, Onix Barberi, for notification of Mr. Turlington passing. Mrs. Zaloudek reports that they will not be coming to view the body and that we may notify [facility] in [place] to receive the body. Dorthey Sawyer, RN

## 2015-12-02 NOTE — Discharge Summary (Signed)
Death Note: For complete accounting of the patient's history of physical exam on presentation please refer to the H&P dictated on 10/10/2015. Patient had known advanced dementia with difficulty with gait. He presented to hospital with shock presumably from a combination of both sepsis and hypovolemia from acute GI bleeding. Patient had acute hypoxic respiratory failure likely from aspiration pneumonia. His CODE STATUS was not known prior to intubation. Patient was subsequently intubated and transitioned to the intensive care unit. I had extensive discussions with the patient's family including his daughter and wife at bedside as well as his son via phone. Given the patient's acute encephalopathy on top of his already advanced dementia, shock, and acute hypoxic respiratory failure representing his multisystem organ failure they decided to transition to full comfort care with terminal extubation. This was achieved with use and the patient was ultimately transitioned to a medical floor bed for ongoing end-of-life care. On review of the patient's electronic medical record nursing staff found the patient without spontaneous respirations or pulse and with pupils fixed and dilated at 6:03 AM on 11/19/15. Family was notified of the patient's passing by nursing staff.  Diagnoses at Death: 1. Acute hypoxic respiratory failure 2. Shock 3. Aspiration pneumonia 4. Acute gastrointestinal bleeding 5. Acute encephalopathy 6. Advanced dementia 7. History of hypertension 8. History of hypercholesterolemia 9. History of cancer 10. History of gout

## 2015-12-02 NOTE — Telephone Encounter (Signed)
On November 02, 2015 I received a death certificate from [facility] (Faxed). The death certificate is for cremation. The patient is a patient of [facility]. The death certificate will be taken to [facility] ([id number]) this pm for signature.  On 11/02/2015 I received the death certificate back from [place]. I got the death certificate ready and faxed the death certificate to the funeral home per their request.

## 2015-12-02 DEATH — deceased

## 2016-02-13 IMAGING — CT CT HEAD W/O CM
3 of 6 series · 14 of 47 positions shown, 16 images · non-contrast
Comparison: 04/02/2015 and 03/30/2015.

CLINICAL DATA: Facial laceration, multiple abrasions and confusion
following a fall at [facility] facility around 3099 hours
today. Previous C5 fracture.

EXAM:
CT HEAD WITHOUT CONTRAST
CT CERVICAL SPINE WITHOUT CONTRAST
TECHNIQUE: Multidetector CT imaging of the head and cervical spine was
performed following the standard protocol without intravenous
contrast. Multiplanar CT image reconstructions of the cervical spine
were also generated.

[Series 7: axial reformats · axial · 0.23mm/px · z∈[-741,-624]mm · 8 of 86 slices shown, 10 images]
[im 10/86  brain]
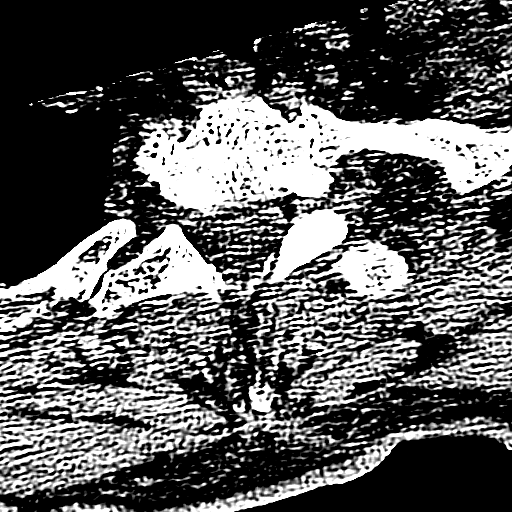
[im 10/86  bone]
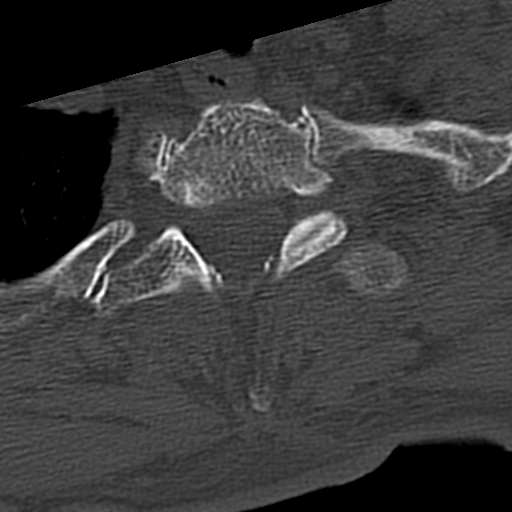
[im 19/86  brain]
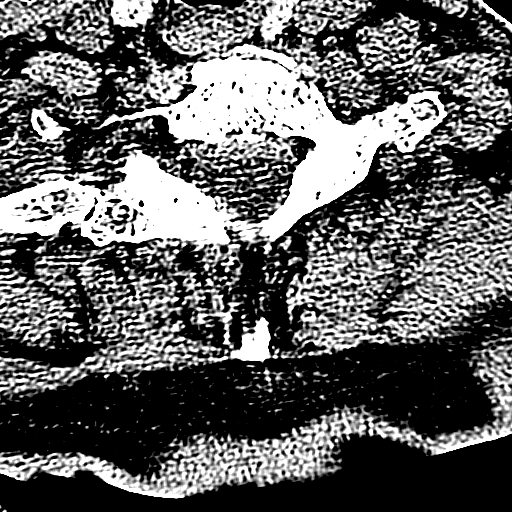
[im 29/86  brain]
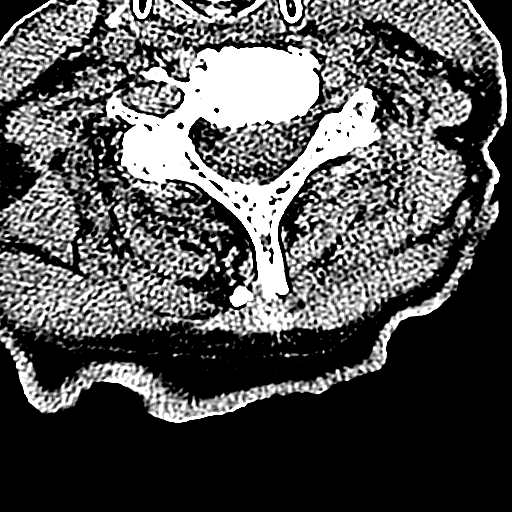
[im 38/86  brain]
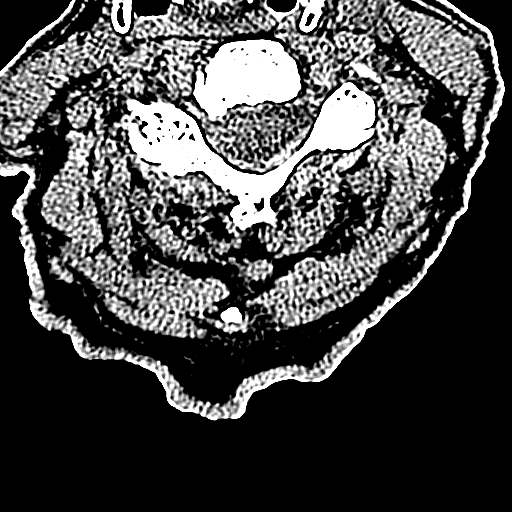
[im 48/86  brain]
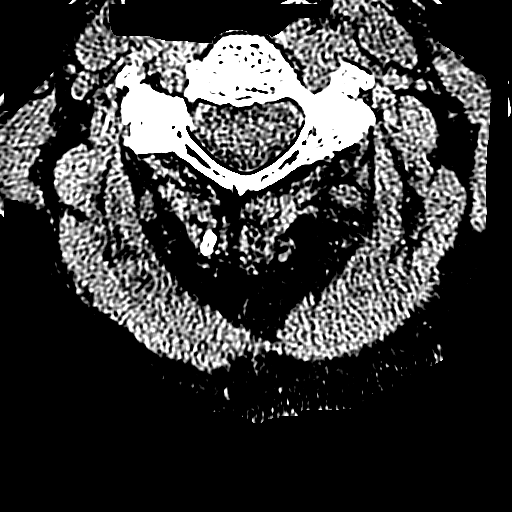
[im 48/86  bone]
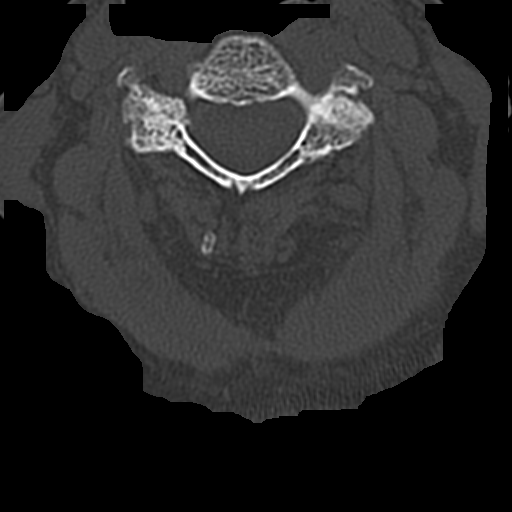
[im 57/86  brain]
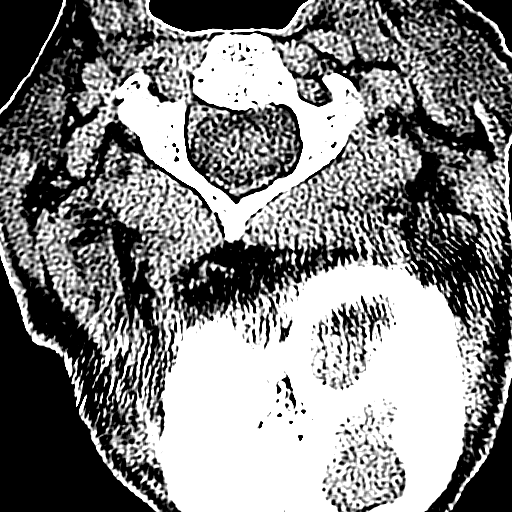
[im 67/86  brain]
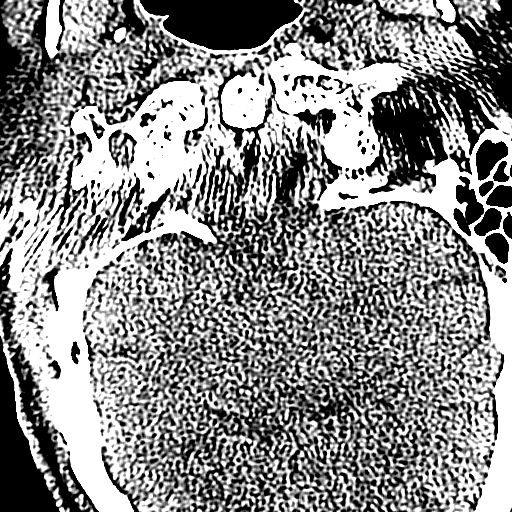
[im 76/86  brain]
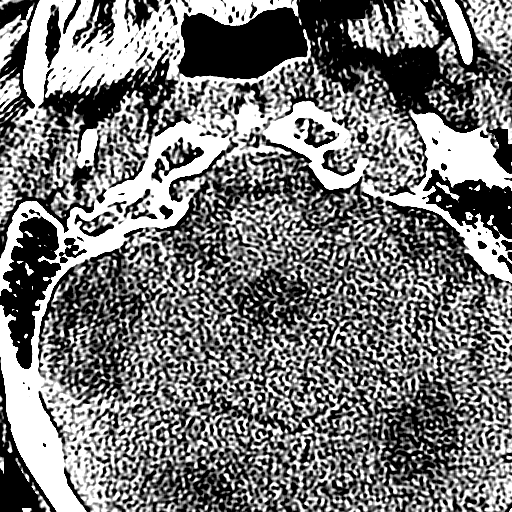

[Series 8: coronal recons · coronal · 0.26mm/px · 3 of 61 slices shown]
[im 21/61  brain]
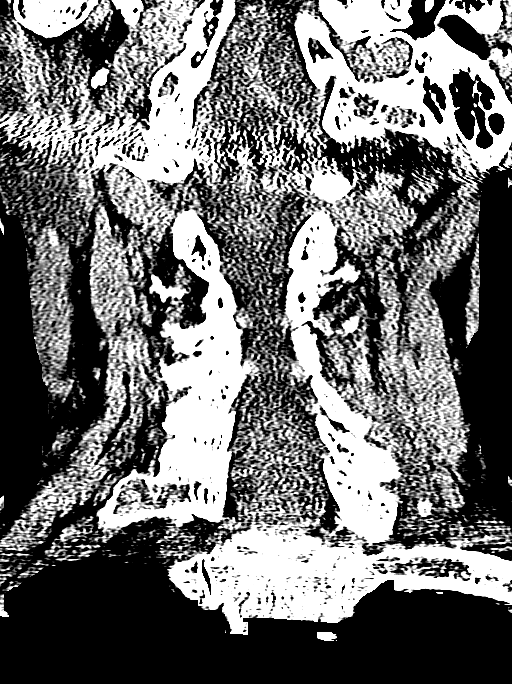
[im 27/61  brain]
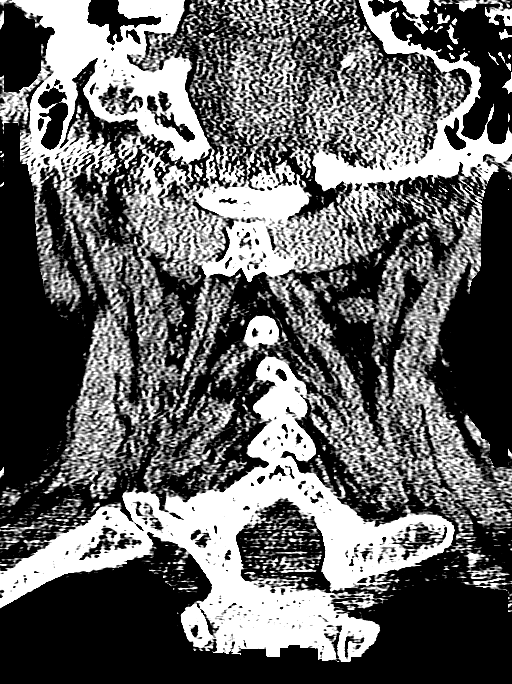
[im 34/61  brain]
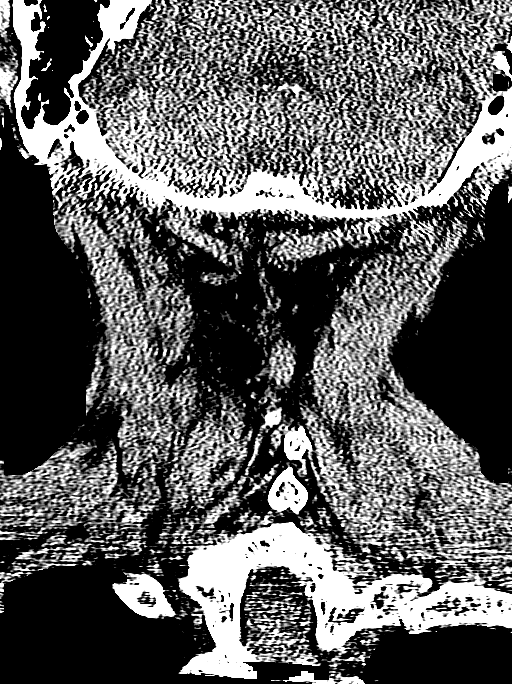

[Series 9: sagittal recons · sagittal · 0.26mm/px · 3 of 49 slices shown]
[im 17/49  brain]
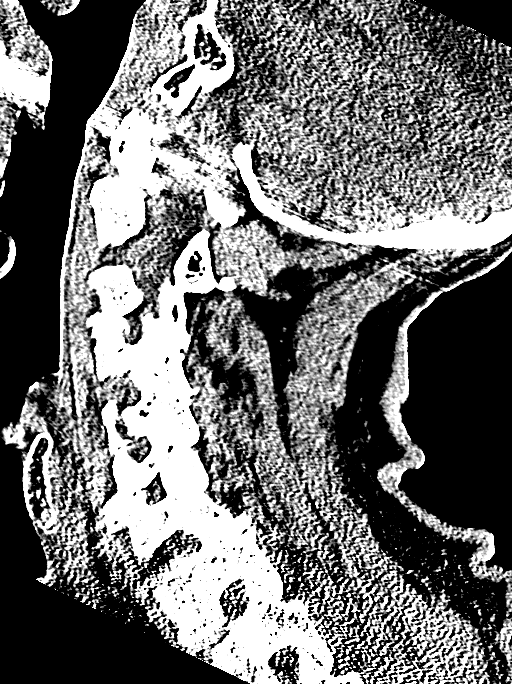
[im 25/49  brain]
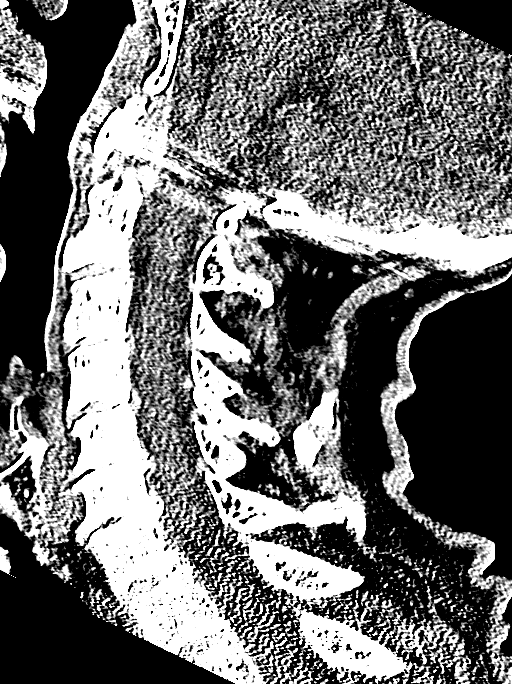
[im 33/49  brain]
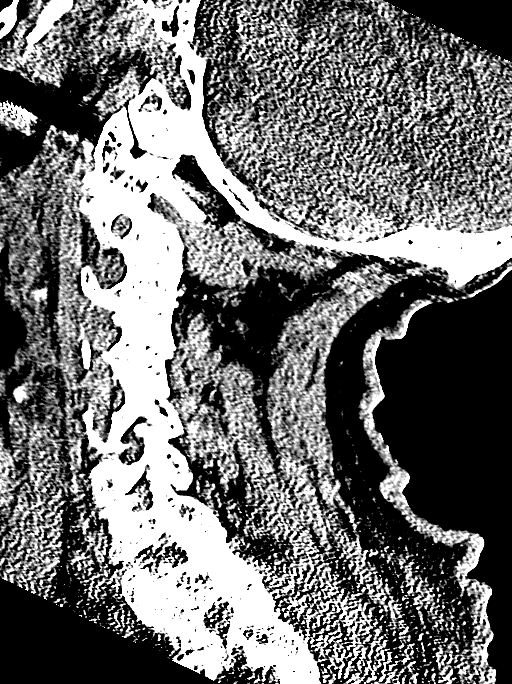

[14 of 47 positions shown; findings below may reference images not displayed]

FINDINGS: CT HEAD FINDINGS

Diffusely enlarged ventricles and subarachnoid spaces. Patchy white
matter low density in both cerebral hemispheres. Stable old
bilateral occipital lobe infarcts. Mild left frontal scalp hematoma
and soft tissue irregularity. No skull fracture, intracranial
hemorrhage or paranasal sinus air-fluid levels.

CT CERVICAL SPINE FINDINGS

Multilevel degenerative changes. The previously demonstrated sensate
nondisplaced right C5 lamina fracture is unchanged. No prevertebral
soft tissue swelling, acute fractures or subluxations.
IMPRESSION: 1. No skull fracture or intracranial hemorrhage.
2. Stable atrophy, chronic small vessel white matter ischemic
changes and old bilateral occipital lobe infarcts.
3. Stable nondisplaced right C5 lamina fracture with no acute
fractures or subluxations.
4. Multilevel cervical spine degenerative changes.
